# Patient Record
Sex: Female | Born: 1945 | Race: White | Hispanic: No | Marital: Married | Smoking: Never smoker
Health system: Southern US, Community
[De-identification: ages and names within clinical notes are randomized; demographics above are authoritative.]

## PROBLEM LIST (undated history)

## (undated) DIAGNOSIS — E7800 Pure hypercholesterolemia, unspecified: Secondary | ICD-10-CM

## (undated) DIAGNOSIS — K219 Gastro-esophageal reflux disease without esophagitis: Secondary | ICD-10-CM

## (undated) DIAGNOSIS — M545 Low back pain, unspecified: Secondary | ICD-10-CM

## (undated) DIAGNOSIS — Z9109 Other allergy status, other than to drugs and biological substances: Secondary | ICD-10-CM

## (undated) DIAGNOSIS — N814 Uterovaginal prolapse, unspecified: Secondary | ICD-10-CM

## (undated) DIAGNOSIS — E119 Type 2 diabetes mellitus without complications: Secondary | ICD-10-CM

## (undated) DIAGNOSIS — R600 Localized edema: Secondary | ICD-10-CM

## (undated) DIAGNOSIS — A498 Other bacterial infections of unspecified site: Secondary | ICD-10-CM

## (undated) DIAGNOSIS — D219 Benign neoplasm of connective and other soft tissue, unspecified: Secondary | ICD-10-CM

## (undated) DIAGNOSIS — J329 Chronic sinusitis, unspecified: Secondary | ICD-10-CM

## (undated) HISTORY — DX: Uterovaginal prolapse, unspecified: N81.4

## (undated) HISTORY — DX: Benign neoplasm of connective and other soft tissue, unspecified: D21.9

## (undated) HISTORY — DX: Localized edema: R60.0

## (undated) HISTORY — DX: Low back pain: M54.5

## (undated) HISTORY — DX: Pure hypercholesterolemia, unspecified: E78.00

## (undated) HISTORY — PX: TONSILLECTOMY AND ADENOIDECTOMY: SUR1326

## (undated) HISTORY — DX: Type 2 diabetes mellitus without complications: E11.9

## (undated) HISTORY — DX: Other allergy status, other than to drugs and biological substances: Z91.09

## (undated) HISTORY — DX: Other bacterial infections of unspecified site: A49.8

## (undated) HISTORY — DX: Low back pain, unspecified: M54.50

## (undated) HISTORY — DX: Chronic sinusitis, unspecified: J32.9

---

## 1997-09-06 ENCOUNTER — Other Ambulatory Visit: Admission: RE | Admit: 1997-09-06 | Discharge: 1997-09-06 | Payer: Self-pay | Admitting: *Deleted

## 1999-11-12 ENCOUNTER — Encounter: Admission: RE | Admit: 1999-11-12 | Discharge: 1999-11-12 | Payer: Self-pay | Admitting: Family Medicine

## 2000-11-24 ENCOUNTER — Encounter: Admission: RE | Admit: 2000-11-24 | Discharge: 2000-11-24 | Payer: Self-pay | Admitting: Family Medicine

## 2001-08-05 ENCOUNTER — Encounter: Admission: RE | Admit: 2001-08-05 | Discharge: 2001-08-05 | Payer: Self-pay | Admitting: Family Medicine

## 2001-08-05 ENCOUNTER — Encounter: Payer: Self-pay | Admitting: Family Medicine

## 2001-10-19 ENCOUNTER — Other Ambulatory Visit: Admission: RE | Admit: 2001-10-19 | Discharge: 2001-10-19 | Payer: Self-pay | Admitting: Obstetrics and Gynecology

## 2002-01-05 ENCOUNTER — Ambulatory Visit (HOSPITAL_COMMUNITY): Admission: RE | Admit: 2002-01-05 | Discharge: 2002-01-05 | Payer: Self-pay | Admitting: Gastroenterology

## 2003-08-10 ENCOUNTER — Other Ambulatory Visit: Admission: RE | Admit: 2003-08-10 | Discharge: 2003-08-10 | Payer: Self-pay | Admitting: Obstetrics and Gynecology

## 2003-09-03 ENCOUNTER — Encounter: Admission: RE | Admit: 2003-09-03 | Discharge: 2003-09-03 | Payer: Self-pay | Admitting: Obstetrics and Gynecology

## 2004-02-20 ENCOUNTER — Encounter: Admission: RE | Admit: 2004-02-20 | Discharge: 2004-02-20 | Payer: Self-pay | Admitting: Family Medicine

## 2005-07-10 ENCOUNTER — Other Ambulatory Visit: Admission: RE | Admit: 2005-07-10 | Discharge: 2005-07-10 | Payer: Self-pay | Admitting: Obstetrics and Gynecology

## 2005-08-14 ENCOUNTER — Encounter: Admission: RE | Admit: 2005-08-14 | Discharge: 2005-08-14 | Payer: Self-pay | Admitting: Obstetrics and Gynecology

## 2007-08-23 ENCOUNTER — Encounter: Admission: RE | Admit: 2007-08-23 | Discharge: 2007-08-23 | Payer: Self-pay | Admitting: Family Medicine

## 2008-03-23 DIAGNOSIS — N814 Uterovaginal prolapse, unspecified: Secondary | ICD-10-CM

## 2008-03-23 HISTORY — DX: Uterovaginal prolapse, unspecified: N81.4

## 2008-09-26 ENCOUNTER — Encounter: Admission: RE | Admit: 2008-09-26 | Discharge: 2008-09-26 | Payer: Self-pay | Admitting: Obstetrics and Gynecology

## 2009-10-15 ENCOUNTER — Encounter: Admission: RE | Admit: 2009-10-15 | Discharge: 2009-10-15 | Payer: Self-pay | Admitting: Obstetrics and Gynecology

## 2010-08-08 NOTE — Op Note (Signed)
   Heather Gallegos, Heather Gallegos                           ACCOUNT NO.:  [id number]   MEDICAL RECORD NO.:  [id number]                   PATIENT TYPE:  AMB   LOCATION:  ENDO                                 FACILITY:  [facility]   PHYSICIAN:  Anselmo Rod, M.D.               DATE OF BIRTH:  February 13, 1946   DATE OF PROCEDURE:  01/05/2002  DATE OF DISCHARGE:                                 OPERATIVE REPORT   PROCEDURE:  Colonoscopy.   ENDOSCOPIST:  Anselmo Rod, M.D.   INSTRUMENT USED:  Pediatric adjustable Olympus colonoscope.   INDICATION FOR PROCEDURE:  A 65 year old white female with a history of  blood and mucus in the stool.  Rule out colonic polyps, masses, etc.   PREPROCEDURE PREPARATION:  Informed consent was procured from the patient.  The patient was fasted for eight hours prior to the procedure and prepped  with a bottle of magnesium citrate and a gallon of NuLytely the night prior  to the procedure.   PREPROCEDURE PHYSICAL:  VITAL SIGNS:  The patient had stable vital signs.  NECK:  Supple.  CHEST:  Clear to auscultation.  S1, S2 regular.  ABDOMEN:  Soft with normal bowel sounds.   DESCRIPTION OF PROCEDURE:  The patient was placed in the left lateral  decubitus position and sedated with 50 mg of Demerol and 5 mg of Versed  intravenously.  Once the patient was adequately sedate and maintained on low-  flow oxygen and continuous cardiac monitoring, the Olympus video colonoscope  was advanced from the rectum to the cecum and terminal ileum without  difficulty.  The terminal ileum was only briefly visualized.  The rest of  the colonic mucosa appeared healthy.  No masses, polyps, erosions,  ulcerations, or diverticula were seen.  Small prolapsing internal  hemorrhoids were noticed on anal inspection.  Retroflexion revealed no other  abnormalities except for internal hemorrhoids as well.   IMPRESSION:  Normal colonoscopy up to the terminal ileum except for small  prolapsing internal  hemorrhoids.   RECOMMENDATIONS:  1. A high-fiber diet has been suggested for the patient along with liberal     fluid intake.  2. Outpatient follow-up is advised in the next two weeks or earlier if need     be.                                                Anselmo Rod, M.D.   JNM/MEDQ  D:  01/05/2002  T:  01/05/2002  Job:  [id number]   cc:   Tammy R. Collins [place], M.D.

## 2010-09-08 ENCOUNTER — Other Ambulatory Visit: Payer: Self-pay | Admitting: Obstetrics and Gynecology

## 2010-09-08 DIAGNOSIS — Z1231 Encounter for screening mammogram for malignant neoplasm of breast: Secondary | ICD-10-CM

## 2010-10-17 ENCOUNTER — Ambulatory Visit
Admission: RE | Admit: 2010-10-17 | Discharge: 2010-10-17 | Disposition: A | Discharge status: Home / Self Care | Payer: Medicare Other | Source: Ambulatory Visit | Attending: Obstetrics and Gynecology | Admitting: Obstetrics and Gynecology

## 2010-10-17 DIAGNOSIS — Z1231 Encounter for screening mammogram for malignant neoplasm of breast: Secondary | ICD-10-CM

## 2011-10-06 ENCOUNTER — Other Ambulatory Visit: Payer: Self-pay | Admitting: Obstetrics and Gynecology

## 2011-10-06 DIAGNOSIS — Z1231 Encounter for screening mammogram for malignant neoplasm of breast: Secondary | ICD-10-CM

## 2011-10-07 ENCOUNTER — Other Ambulatory Visit: Payer: Self-pay | Admitting: Obstetrics and Gynecology

## 2011-10-07 DIAGNOSIS — Z780 Asymptomatic menopausal state: Secondary | ICD-10-CM

## 2011-10-22 ENCOUNTER — Ambulatory Visit
Admission: RE | Admit: 2011-10-22 | Discharge: 2011-10-22 | Disposition: A | Discharge status: Home / Self Care | Payer: Medicare Other | Source: Ambulatory Visit | Attending: Obstetrics and Gynecology | Admitting: Obstetrics and Gynecology

## 2011-10-22 DIAGNOSIS — Z1231 Encounter for screening mammogram for malignant neoplasm of breast: Secondary | ICD-10-CM

## 2011-10-27 ENCOUNTER — Inpatient Hospital Stay: Admission: RE | Admit: 2011-10-27 | Payer: Medicare Other | Source: Ambulatory Visit

## 2011-11-02 ENCOUNTER — Other Ambulatory Visit: Payer: Medicare Other

## 2011-11-04 ENCOUNTER — Ambulatory Visit
Admission: RE | Admit: 2011-11-04 | Discharge: 2011-11-04 | Disposition: A | Discharge status: Home / Self Care | Payer: Medicare Other | Source: Ambulatory Visit | Attending: Obstetrics and Gynecology | Admitting: Obstetrics and Gynecology

## 2011-11-04 DIAGNOSIS — Z780 Asymptomatic menopausal state: Secondary | ICD-10-CM

## 2012-02-24 ENCOUNTER — Other Ambulatory Visit: Payer: Self-pay | Admitting: Otolaryngology

## 2012-02-24 DIAGNOSIS — J019 Acute sinusitis, unspecified: Secondary | ICD-10-CM

## 2012-02-24 DIAGNOSIS — R51 Headache: Secondary | ICD-10-CM

## 2012-02-24 DIAGNOSIS — J342 Deviated nasal septum: Secondary | ICD-10-CM

## 2012-03-01 ENCOUNTER — Ambulatory Visit
Admission: RE | Admit: 2012-03-01 | Discharge: 2012-03-01 | Disposition: A | Discharge status: Home / Self Care | Payer: Medicare Other | Source: Ambulatory Visit | Attending: Otolaryngology | Admitting: Otolaryngology

## 2012-03-01 DIAGNOSIS — J019 Acute sinusitis, unspecified: Secondary | ICD-10-CM

## 2012-03-01 DIAGNOSIS — R51 Headache: Secondary | ICD-10-CM

## 2012-03-01 DIAGNOSIS — J342 Deviated nasal septum: Secondary | ICD-10-CM

## 2012-09-29 ENCOUNTER — Encounter: Payer: Self-pay | Admitting: Obstetrics and Gynecology

## 2012-09-30 ENCOUNTER — Encounter: Payer: Self-pay | Admitting: Obstetrics and Gynecology

## 2012-09-30 ENCOUNTER — Ambulatory Visit: Payer: Self-pay | Admitting: Obstetrics and Gynecology

## 2012-09-30 DIAGNOSIS — Z01419 Encounter for gynecological examination (general) (routine) without abnormal findings: Secondary | ICD-10-CM

## 2012-10-04 ENCOUNTER — Other Ambulatory Visit: Payer: Self-pay

## 2012-10-04 DIAGNOSIS — Z1231 Encounter for screening mammogram for malignant neoplasm of breast: Secondary | ICD-10-CM

## 2012-10-28 ENCOUNTER — Ambulatory Visit
Admission: RE | Admit: 2012-10-28 | Discharge: 2012-10-28 | Disposition: A | Discharge status: Home / Self Care | Payer: Medicare Other | Source: Ambulatory Visit

## 2012-10-28 DIAGNOSIS — Z1231 Encounter for screening mammogram for malignant neoplasm of breast: Secondary | ICD-10-CM

## 2013-03-23 HISTORY — PX: BREAST CYST ASPIRATION: SHX578

## 2013-06-28 ENCOUNTER — Ambulatory Visit (INDEPENDENT_AMBULATORY_CARE_PROVIDER_SITE_OTHER): Payer: Medicare Other | Admitting: Gynecology

## 2013-06-28 ENCOUNTER — Encounter: Payer: Self-pay | Admitting: Gynecology

## 2013-06-28 VITALS — BP 122/88 | HR 88 | Resp 18 | Ht 59.25 in | Wt 154.0 lb

## 2013-06-28 DIAGNOSIS — Z01419 Encounter for gynecological examination (general) (routine) without abnormal findings: Secondary | ICD-10-CM

## 2013-06-28 DIAGNOSIS — E785 Hyperlipidemia, unspecified: Secondary | ICD-10-CM | POA: Insufficient documentation

## 2013-06-28 DIAGNOSIS — Z124 Encounter for screening for malignant neoplasm of cervix: Secondary | ICD-10-CM

## 2013-06-28 DIAGNOSIS — Z4689 Encounter for fitting and adjustment of other specified devices: Secondary | ICD-10-CM

## 2013-06-28 DIAGNOSIS — E7800 Pure hypercholesterolemia, unspecified: Secondary | ICD-10-CM

## 2013-06-28 DIAGNOSIS — N814 Uterovaginal prolapse, unspecified: Secondary | ICD-10-CM

## 2013-06-28 DIAGNOSIS — Z0000 Encounter for general adult medical examination without abnormal findings: Secondary | ICD-10-CM

## 2013-06-28 LAB — POCT URINALYSIS DIPSTICK
BILIRUBIN UA: NEGATIVE
GLUCOSE UA: NEGATIVE
KETONES UA: NEGATIVE
Leukocytes, UA: NEGATIVE
NITRITE UA: NEGATIVE
PH UA: 5
PROTEIN UA: NEGATIVE
RBC UA: NEGATIVE
UROBILINOGEN UA: NEGATIVE

## 2013-06-28 NOTE — Progress Notes (Signed)
68 y.o. Caucasian female   G2P2 here for annual exam. She does not report hot flashes, does not have night sweats, does have vaginal dryness.  She is using lubricants.  She does not report post-menopasual bleeding. Pt used pessary for short time for prolaspe but found it uncomfortable.  Pt has some issues emptying bladder, will need to reorient  No LMP recorded. Patient is postmenopausal.          Sexually active: yes  The current method of family planning is none.    Exercising: no  The patient does not participate in regular exercise at present. Last pap: 12/2011 - with PCP  Abnormal PAP: yes, about 10 years ago Mammogram: 10/2012 - BI-RADS 1: Neg BSE: no Colonoscopy: 11/2012 - Every 5 years DEXA: 10/2011 Alcohol: No Tobacco: No  Health Maintenance  Topic Date Due  . Tetanus/tdap  07/27/1964  . Colonoscopy  07/28/1995  . Zostavax  07/27/2005  . Pneumococcal Polysaccharide Vaccine Age 41 And Over  07/28/2010  . Influenza Vaccine  10/21/2013  . Mammogram  10/29/2014    Family History  Problem Relation Age of Onset  . Cancer Mother 8    Stomach cancer  . Hypertension Brother   . Diabetes Brother     Type II  . Osteoporosis Maternal Aunt     There are no active problems to display for this patient.   Past Medical History  Diagnosis Date  . Fibroid   . Uterine prolapse 2010  . Environmental allergies   . Elevated cholesterol     Past Surgical History  Procedure Laterality Date  . Tonsillectomy and adenoidectomy      Allergies: Review of patient's allergies indicates no known allergies.  Current Outpatient Prescriptions  Medication Sig Dispense Refill  . b complex vitamins tablet Take 1 tablet by mouth daily.      . Biotin 1000 MCG tablet Take 1,000 mcg by mouth daily.      . Calcium Carbonate (CALCIUM 600 PO) Take by mouth daily.      . Cetirizine HCl (ZYRTEC PO) Take by mouth as needed.      . fish oil-omega-3 fatty acids 1000 MG capsule Take 2 g by mouth  daily.      . Glucosamine 500 MG CAPS Take by mouth.      . LUTEIN PO Take 20 mg by mouth daily.      . simvastatin (ZOCOR) 40 MG tablet Take 40 mg by mouth every evening.      . vitamin C (ASCORBIC ACID) 500 MG tablet Take 500 mg by mouth daily.      . vitamin E 400 UNIT capsule Take 400 Units by mouth daily.       No current facility-administered medications for this visit.    ROS: Pertinent items are noted in HPI.  Exam:    BP 122/88  Pulse 88  Resp 18  Ht 4' 11.25" (1.505 m)  Wt 154 lb (69.854 kg)  BMI 30.84 kg/m2 Weight change: @WEIGHTCHANGE @ Last 3 height recordings:  Ht Readings from Last 3 Encounters:  06/28/13 4' 11.25" (1.505 m)   General appearance: alert, cooperative and appears stated age Head: Normocephalic, without obvious abnormality, atraumatic Neck: no adenopathy, no carotid bruit, no JVD, supple, symmetrical, trachea midline and thyroid not enlarged, symmetric, no tenderness/mass/nodules Lungs: clear to auscultation bilaterally Breasts: normal appearance, no masses or tenderness Heart: regular rate and rhythm, S1, S2 normal, no murmur, click, rub or gallop Abdomen: soft, non-tender; bowel sounds  normal; no masses,  no organomegaly Extremities: extremities normal, atraumatic, no cyanosis or edema Skin: Skin color, texture, turgor normal. No rashes or lesions Lymph nodes: Cervical, supraclavicular, and axillary nodes normal. no inguinal nodes palpated Neurologic: Grossly normal   Pelvic: External genitalia:  atrophic appearance              Urethra: normal appearing urethra with no masses, tenderness or lesions              Bartholins and Skenes: Bartholin's, Urethra, Skene's normal                 Vagina: atrophic              Cervix: erosion noted, prolapsing through labia              Pap taken: no        Bimanual Exam:  Uterus:  Axial, atrophic prolapsing                                      Adnexa:    no masses                                       Rectovaginal: Confirms                                      Anus:  normal sphincter tone, no lesions  A: well woman Uterine prolapse without cystocele/rectocele     P: mammogram counseled on breast self exam, mammography screening, adequate intake of calcium and vitamin D, diet and exercise Recommend avoiding valsalva with void, discussed alternative ways to micturate Pt informed re cervical erosion due to prolapse without valsalva, had tried ring in past, discussed alternative pessary shapes, hodge with support placed #4, comfortable but pt would do better with #3 return annually or prn Discussed PAP guideline changes, importance of weight bearing exercises, calcium, vit D and balanced diet.  An After Visit Summary was printed and given to the patient.

## 2013-08-04 ENCOUNTER — Ambulatory Visit (INDEPENDENT_AMBULATORY_CARE_PROVIDER_SITE_OTHER): Payer: Medicare Other | Admitting: Gynecology

## 2013-08-04 VITALS — BP 140/78 | HR 64 | Resp 16 | Ht 59.25 in | Wt 153.4 lb

## 2013-08-04 DIAGNOSIS — Z4689 Encounter for fitting and adjustment of other specified devices: Secondary | ICD-10-CM

## 2013-08-04 DIAGNOSIS — N814 Uterovaginal prolapse, unspecified: Secondary | ICD-10-CM

## 2013-08-04 NOTE — Progress Notes (Signed)
Subjective:     Patient ID: Heather Gallegos, female   DOB: [birth year], 68 y.o.   MRN: [id number]  HPI Comments: Pt is here for pessary placement for uterine prolapse.  Pt had had a hodge with support placed at annual and felt it was more comfortable that the ring but felt that the smaller size would be better.  She denies any complaints.  She has had better success with complete voiding after changing position.      Review of Systems  Constitutional: Negative for fever and chills.  Genitourinary: Negative for dysuria, urgency, frequency, flank pain, decreased urine volume, vaginal bleeding, vaginal discharge, vaginal pain and pelvic pain.       Objective:   Physical Exam  Nursing note and vitals reviewed. Constitutional: She is oriented to person, place, and time. She appears well-developed and well-nourished.  Neurological: She is alert and oriented to person, place, and time.  pelvic: Cervix noted at hymenal ring, no excorations, labia not split.   Uterus easily replaced, mobile nontender Adnexa: negative     Assessment:     Uterine prolapse     Plan:     Pessary placement #3 hodge with support placed with deflection of uterus, pt comfortable.  Re-examined upright with  Valsalva, pessary shifted and anterior portion noted not to return to pre-placement shape. #4 placed, pt comfortable, re-examined upright and appears supportive and in place. Pt instructed to return after a few hours to assess comfort and activities of daily living.  Pt returned, pessary had shifted with voiding and was partially dislodged. Pt elects to not try another pessary.  She is currently not bothered with prolapse and review of records indicates that it has been stable over a number of years.  Pt instructed to call for signs of UTI, flank pain or difficulty voiding.  In addition if she should notice further descent before next appointment.  She had not been interested in surgery, but would consider if there  is a change or if she is no longer working  Total 67m spent between 2 visits

## 2013-10-04 ENCOUNTER — Other Ambulatory Visit: Payer: Self-pay

## 2013-10-04 DIAGNOSIS — Z1231 Encounter for screening mammogram for malignant neoplasm of breast: Secondary | ICD-10-CM

## 2013-10-05 LAB — LIPID PANEL
Cholesterol: 153 mg/dL (ref 0–200)
HDL: 48 mg/dL (ref 35–70)
LDL Cholesterol: 70 mg/dL
Triglycerides: 174 mg/dL — AB (ref 40–160)

## 2013-10-30 ENCOUNTER — Ambulatory Visit
Admission: RE | Admit: 2013-10-30 | Discharge: 2013-10-30 | Disposition: A | Discharge status: Home / Self Care | Payer: Medicare Other | Source: Ambulatory Visit

## 2013-10-30 DIAGNOSIS — Z1231 Encounter for screening mammogram for malignant neoplasm of breast: Secondary | ICD-10-CM

## 2013-10-31 ENCOUNTER — Other Ambulatory Visit: Payer: Self-pay | Admitting: Family Medicine

## 2013-10-31 DIAGNOSIS — R928 Other abnormal and inconclusive findings on diagnostic imaging of breast: Secondary | ICD-10-CM

## 2013-11-01 ENCOUNTER — Other Ambulatory Visit: Payer: Self-pay | Admitting: Family Medicine

## 2013-11-01 DIAGNOSIS — R928 Other abnormal and inconclusive findings on diagnostic imaging of breast: Secondary | ICD-10-CM

## 2013-11-07 ENCOUNTER — Other Ambulatory Visit: Payer: Self-pay | Admitting: Family Medicine

## 2013-11-07 ENCOUNTER — Ambulatory Visit
Admission: RE | Admit: 2013-11-07 | Discharge: 2013-11-07 | Disposition: A | Discharge status: Home / Self Care | Payer: Medicare Other | Source: Ambulatory Visit | Attending: Family Medicine | Admitting: Family Medicine

## 2013-11-07 DIAGNOSIS — R928 Other abnormal and inconclusive findings on diagnostic imaging of breast: Secondary | ICD-10-CM

## 2013-11-10 ENCOUNTER — Other Ambulatory Visit: Payer: Self-pay | Admitting: Family Medicine

## 2013-11-10 DIAGNOSIS — R928 Other abnormal and inconclusive findings on diagnostic imaging of breast: Secondary | ICD-10-CM

## 2013-11-14 ENCOUNTER — Ambulatory Visit
Admission: RE | Admit: 2013-11-14 | Discharge: 2013-11-14 | Disposition: A | Discharge status: Home / Self Care | Payer: Medicare Other | Source: Ambulatory Visit | Attending: Family Medicine | Admitting: Family Medicine

## 2013-11-14 ENCOUNTER — Other Ambulatory Visit: Payer: Self-pay | Admitting: Family Medicine

## 2013-11-14 DIAGNOSIS — R928 Other abnormal and inconclusive findings on diagnostic imaging of breast: Secondary | ICD-10-CM

## 2014-01-22 ENCOUNTER — Encounter: Payer: Self-pay | Admitting: Gynecology

## 2014-02-20 ENCOUNTER — Telehealth: Payer: Self-pay | Admitting: Gynecology

## 2014-02-20 NOTE — Telephone Encounter (Signed)
lmtcb to rs aex from 07/04/14

## 2014-05-04 DIAGNOSIS — J320 Chronic maxillary sinusitis: Secondary | ICD-10-CM | POA: Diagnosis not present

## 2014-06-04 DIAGNOSIS — J22 Unspecified acute lower respiratory infection: Secondary | ICD-10-CM | POA: Diagnosis not present

## 2014-06-04 DIAGNOSIS — J040 Acute laryngitis: Secondary | ICD-10-CM | POA: Diagnosis not present

## 2014-07-04 ENCOUNTER — Ambulatory Visit: Payer: Medicare Other | Admitting: Certified Nurse Midwife

## 2014-07-04 ENCOUNTER — Ambulatory Visit: Payer: Medicare Other | Admitting: Gynecology

## 2014-08-22 LAB — HM COLONOSCOPY

## 2014-10-03 DIAGNOSIS — H2513 Age-related nuclear cataract, bilateral: Secondary | ICD-10-CM | POA: Diagnosis not present

## 2014-10-22 ENCOUNTER — Other Ambulatory Visit: Payer: Self-pay

## 2014-10-22 DIAGNOSIS — Z1231 Encounter for screening mammogram for malignant neoplasm of breast: Secondary | ICD-10-CM

## 2014-10-24 ENCOUNTER — Ambulatory Visit (INDEPENDENT_AMBULATORY_CARE_PROVIDER_SITE_OTHER): Payer: Medicare Other | Admitting: Obstetrics and Gynecology

## 2014-10-24 ENCOUNTER — Encounter: Payer: Self-pay | Admitting: Obstetrics and Gynecology

## 2014-10-24 VITALS — BP 156/96 | HR 84 | Resp 16 | Ht 59.0 in | Wt 156.0 lb

## 2014-10-24 DIAGNOSIS — N3946 Mixed incontinence: Secondary | ICD-10-CM | POA: Diagnosis not present

## 2014-10-24 DIAGNOSIS — N814 Uterovaginal prolapse, unspecified: Secondary | ICD-10-CM

## 2014-10-24 DIAGNOSIS — Z0000 Encounter for general adult medical examination without abnormal findings: Secondary | ICD-10-CM | POA: Diagnosis not present

## 2014-10-24 DIAGNOSIS — Z124 Encounter for screening for malignant neoplasm of cervix: Secondary | ICD-10-CM | POA: Diagnosis not present

## 2014-10-24 DIAGNOSIS — Z01419 Encounter for gynecological examination (general) (routine) without abnormal findings: Secondary | ICD-10-CM | POA: Diagnosis not present

## 2014-10-24 DIAGNOSIS — Z1151 Encounter for screening for human papillomavirus (HPV): Secondary | ICD-10-CM | POA: Diagnosis not present

## 2014-10-24 LAB — POCT URINALYSIS DIPSTICK
Bilirubin, UA: NEGATIVE
Glucose, UA: NEGATIVE
Ketones, UA: NEGATIVE
LEUKOCYTES UA: NEGATIVE
Nitrite, UA: NEGATIVE
Protein, UA: NEGATIVE
RBC UA: NEGATIVE
Spec Grav, UA: 1.02
Urobilinogen, UA: NEGATIVE
pH, UA: 7

## 2014-10-24 NOTE — Progress Notes (Addendum)
Patient ID: Heather Gallegos, female   DOB: 1945/04/27, 69 y.o.   MRN: [id number] 69 y.o. G2P2 MarriedCaucasianF here for annual exam.  The patient has a h/o uterine prolapse, tried a few pessaries, didn't work well. Her prolapse hasn't changed, her cervix comes out. She only notices it a couple of times a week when she is sitting. Not aware of it when she is standing. She leaks urine with valsalva, wears a small pad. Leaks a small amounts. Occasionally leaks on the way to the bathroom, small amount, happens about 1 x a week. Normal bowel function. Sexually active, no pain. No vaginal bleeding.   Patient's last menstrual period was 03/23/1998.          Sexually active: Yes.    The current method of family planning is post menopausal status.    Exercising: No.  The patient does not participate in regular exercise at present. Smoker:  no  Health Maintenance: Pap:  12/2011 with PCP- WNL per patient  History of abnormal Pap:  At least 15 years ago, negative biopsy. Last pap was normal in 2013  MMG:  11-07-13- ABN -U/S- Nodule- U/S guided aspiration of left breast- cyst located @ 1 o'clock position - repeat screening MM in 1 yr. Mammogram scheduled for next month. Colonoscopy:  11/2012 - every 5 years (polyp) BMD:   10/2011, slight osteopenia, followed with her primary. TDaP:  2010 Screening Lab: PCP does labs  Urine today: WNL    reports that she has never smoked. She has never used smokeless tobacco. She reports that she does not drink alcohol or use illicit drugs.  Past Medical History  Diagnosis Date  . Fibroid   . Uterine prolapse 2010  . Environmental allergies   . Elevated cholesterol     Past Surgical History  Procedure Laterality Date  . Tonsillectomy and adenoidectomy      Current Outpatient Prescriptions  Medication Sig Dispense Refill  . b complex vitamins tablet Take 1 tablet by mouth daily.    . Biotin 1000 MCG tablet Take 1,000 mcg by mouth daily.    . Calcium Carbonate (CALCIUM  600 PO) Take by mouth daily.    . Cetirizine HCl (ZYRTEC PO) Take by mouth as needed.    . fish oil-omega-3 fatty acids 1000 MG capsule Take 2 g by mouth daily.    . Glucosamine 500 MG CAPS Take by mouth.    . LUTEIN PO Take 20 mg by mouth daily.    . simvastatin (ZOCOR) 40 MG tablet Take 40 mg by mouth every evening.    . vitamin C (ASCORBIC ACID) 500 MG tablet Take 500 mg by mouth daily.    . vitamin E 400 UNIT capsule Take 400 Units by mouth daily.     No current facility-administered medications for this visit.    Family History  Problem Relation Age of Onset  . Cancer Mother 20    Stomach cancer  . Hypertension Brother   . Diabetes Brother     Type II  . Osteoporosis Maternal Aunt     ROS:  Pertinent items are noted in HPI.  Otherwise, a comprehensive ROS was negative.  Exam:   BP 160/84 mmHg  Pulse 84  Resp 16  Ht 4\' 11"  (1.499 m)  Wt 156 lb (70.761 kg)  BMI 31.49 kg/m2  LMP 03/23/1998  Weight change: @WEIGHTCHANGE @ Height:   Height: 4\' 11"  (149.9 cm)  Ht Readings from Last 3 Encounters:  10/24/14 4\' 11"  (1.499 m)  08/04/13 4' 11.25" (1.505 m)  06/28/13 4' 11.25" (1.505 m)    General appearance: alert, cooperative and appears stated age Head: Normocephalic, without obvious abnormality, atraumatic Neck: no adenopathy, supple, symmetrical, trachea midline and thyroid normal to inspection and palpation Lungs: clear to auscultation bilaterally Breasts: normal appearance, no masses or tenderness Heart: regular rate and rhythm Abdomen: soft, non-tender; bowel sounds normal; no masses,  no organomegaly Extremities: extremities normal, atraumatic, no cyanosis or edema Skin: Skin color, texture, turgor normal. No rashes or lesions Lymph nodes: Cervical, supraclavicular, and axillary nodes normal. No abnormal inguinal nodes palpated Neurologic: Grossly normal   Pelvic: External genitalia:  no lesions              Urethra:  normal appearing urethra with no masses,  tenderness or lesions              Bartholins and Skenes: normal                 Vagina: normal appearing vagina with normal color and discharge, no lesions              Cervix: no lesions              Pap taken: Yes.   Bimanual Exam:  Uterus:  Normal sized, mobile, retroverted uterus, elongated cervix with grade 2-3 uterine prolapse. The cervix projects 2-3 cm beyond the hymen with valsalva. The patient was examined supine and standing.              Adnexa: normal adnexa and no mass, fullness, tenderness               Rectovaginal: Confirms               Anus:  normal sphincter tone, no lesions  Chaperone was present for exam.  A:  Well Woman with normal exam  Mixed incontinence  Prolapse, grade 2-3, currently tolerable  Elevated BP, no h/o hypertension  P:   F/U BP with primary  Pap with HPV  Mammogram next month  Labs with primary  Continue calcium and vit D  Continue BSE  Send urine for Ua, c&s  Set up PT for incontinence  Discussed prolapse and options for treatment. Information given.  Avoid heavy lifting and straining   Cc: Dr Emmie Niemann

## 2014-10-24 NOTE — Patient Instructions (Signed)
EXERCISE AND DIET:  We recommended that you start or continue a regular exercise program for good health. Regular exercise means any activity that makes your heart beat faster and makes you sweat.  We recommend exercising at least 30 minutes per day at least 3 days a week, preferably 4 or 5.  We also recommend a diet low in fat and sugar.  Inactivity, poor dietary choices and obesity can cause diabetes, heart attack, stroke, and kidney damage, among others.    ALCOHOL AND SMOKING:  Women should limit their alcohol intake to no more than 7 drinks/beers/glasses of wine (combined, not each!) per week. Moderation of alcohol intake to this level decreases your risk of breast cancer and liver damage. And of course, no recreational drugs are part of a healthy lifestyle.  And absolutely no smoking or even second hand smoke. Most people know smoking can cause heart and lung diseases, but did you know it also contributes to weakening of your bones? Aging of your skin?  Yellowing of your teeth and nails?  CALCIUM AND VITAMIN D:  Adequate intake of calcium and Vitamin D are recommended.  The recommendations for exact amounts of these supplements seem to change often, but generally speaking 600 mg of calcium (either carbonate or citrate) and 800 units of Vitamin D per day seems prudent. Certain women may benefit from higher intake of Vitamin D.  If you are among these women, your doctor will have told you during your visit.    PAP SMEARS:  Pap smears, to check for cervical cancer or precancers,  have traditionally been done yearly, although recent scientific advances have shown that most women can have pap smears less often.  However, every woman still should have a physical exam from her gynecologist every year. It will include a breast check, inspection of the vulva and vagina to check for abnormal growths or skin changes, a visual exam of the cervix, and then an exam to evaluate the size and shape of the uterus and  ovaries.  And after 69 years of age, a rectal exam is indicated to check for rectal cancers. We will also provide age appropriate advice regarding health maintenance, like when you should have certain vaccines, screening for sexually transmitted diseases, bone density testing, colonoscopy, mammograms, etc.   MAMMOGRAMS:  All women over 40 years old should have a yearly mammogram. Many facilities now offer a "3D" mammogram, which may cost around $50 extra out of pocket. If possible,  we recommend you accept the option to have the 3D mammogram performed.  It both reduces the number of women who will be called back for extra views which then turn out to be normal, and it is better than the routine mammogram at detecting truly abnormal areas.    COLONOSCOPY:  Colonoscopy to screen for colon cancer is recommended for all women at age 50.  We know, you hate the idea of the prep.  We agree, BUT, having colon cancer and not knowing it is worse!!  Colon cancer so often starts as a polyp that can be seen and removed at colonscopy, which can quite literally save your life!  And if your first colonoscopy is normal and you have no family history of colon cancer, most women don't have to have it again for 10 years.  Once every ten years, you can do something that may end up saving your life, right?  We will be happy to help you get it scheduled when you are ready.    Be sure to check your insurance coverage so you understand how much it will cost.  It may be covered as a preventative service at no cost, but you should check your particular policy.     Kegel Exercises The goal of Kegel exercises is to isolate and exercise your pelvic floor muscles. These muscles act as a hammock that supports the rectum, vagina, small intestine, and uterus. As the muscles weaken, the hammock sags and these organs are displaced from their normal positions. Kegel exercises can strengthen your pelvic floor muscles and help you to improve  bladder and bowel control, improve sexual response, and help reduce many problems and some discomfort during pregnancy. Kegel exercises can be done anywhere and at any time. HOW TO PERFORM KEGEL EXERCISES 1. Locate your pelvic floor muscles. To do this, squeeze (contract) the muscles that you use when you try to stop the flow of urine. You will feel a tightness in the vaginal area (women) and a tight lift in the rectal area (men and women). 2. When you begin, contract your pelvic muscles tight for 2-5 seconds, then relax them for 2-5 seconds. This is one set. Do 4-5 sets with a short pause in between. 3. Contract your pelvic muscles for 8-10 seconds, then relax them for 8-10 seconds. Do 4-5 sets. If you cannot contract your pelvic muscles for 8-10 seconds, try 5-7 seconds and work your way up to 8-10 seconds. Your goal is 4-5 sets of 10 contractions each day. Keep your stomach, buttocks, and legs relaxed during the exercises. Perform sets of both short and long contractions. Vary your positions. Perform these contractions 3-4 times per day. Perform sets while you are:   Lying in bed in the morning.  Standing at lunch.  Sitting in the late afternoon.  Lying in bed at night. You should do 40-50 contractions per day. Do not perform more Kegel exercises per day than recommended. Overexercising can cause muscle fatigue. Continue these exercises for for at least 15-20 weeks or as directed by your caregiver. Document Released: 02/24/2012 Document Reviewed: 02/24/2012 [facility] Patient Information 2015 [place]. This information is not intended to replace advice given to you by your health care provider. Make sure you discuss any questions you have with your health care provider.

## 2014-10-25 LAB — URINALYSIS, MICROSCOPIC ONLY
Bacteria, UA: NONE SEEN [HPF]
CASTS: NONE SEEN [LPF]
Crystals: NONE SEEN [HPF]
RBC / HPF: NONE SEEN RBC/HPF (ref <=2)
WBC UA: NONE SEEN WBC/HPF (ref <=5)
YEAST: NONE SEEN [HPF]

## 2014-10-26 LAB — URINE CULTURE

## 2014-10-26 LAB — IPS PAP TEST WITH HPV

## 2014-11-05 ENCOUNTER — Ambulatory Visit: Payer: Medicare Other

## 2014-11-12 DIAGNOSIS — M6281 Muscle weakness (generalized): Secondary | ICD-10-CM | POA: Diagnosis not present

## 2014-11-12 DIAGNOSIS — N3946 Mixed incontinence: Secondary | ICD-10-CM | POA: Diagnosis not present

## 2014-11-12 DIAGNOSIS — N814 Uterovaginal prolapse, unspecified: Secondary | ICD-10-CM | POA: Diagnosis not present

## 2014-11-12 DIAGNOSIS — R278 Other lack of coordination: Secondary | ICD-10-CM | POA: Diagnosis not present

## 2014-11-29 DIAGNOSIS — L239 Allergic contact dermatitis, unspecified cause: Secondary | ICD-10-CM | POA: Diagnosis not present

## 2014-12-14 ENCOUNTER — Ambulatory Visit
Admission: RE | Admit: 2014-12-14 | Discharge: 2014-12-14 | Disposition: A | Discharge status: Home / Self Care | Payer: Medicare Other | Source: Ambulatory Visit

## 2014-12-14 DIAGNOSIS — Z1231 Encounter for screening mammogram for malignant neoplasm of breast: Secondary | ICD-10-CM

## 2015-02-26 DIAGNOSIS — H109 Unspecified conjunctivitis: Secondary | ICD-10-CM | POA: Diagnosis not present

## 2015-02-26 DIAGNOSIS — M7989 Other specified soft tissue disorders: Secondary | ICD-10-CM | POA: Diagnosis not present

## 2015-02-26 DIAGNOSIS — M545 Low back pain: Secondary | ICD-10-CM | POA: Diagnosis not present

## 2015-02-26 DIAGNOSIS — Z23 Encounter for immunization: Secondary | ICD-10-CM | POA: Diagnosis not present

## 2015-03-14 ENCOUNTER — Encounter: Payer: Self-pay | Admitting: Vascular Surgery

## 2015-03-14 ENCOUNTER — Other Ambulatory Visit: Payer: Self-pay | Admitting: *Deleted

## 2015-03-14 DIAGNOSIS — I83893 Varicose veins of bilateral lower extremities with other complications: Secondary | ICD-10-CM

## 2015-03-14 DIAGNOSIS — M7989 Other specified soft tissue disorders: Secondary | ICD-10-CM

## 2015-04-24 ENCOUNTER — Encounter: Payer: Self-pay | Admitting: Vascular Surgery

## 2015-04-29 ENCOUNTER — Encounter: Payer: Self-pay | Admitting: Vascular Surgery

## 2015-04-30 ENCOUNTER — Encounter: Payer: Medicare Other | Admitting: Vascular Surgery

## 2015-04-30 ENCOUNTER — Encounter (HOSPITAL_COMMUNITY): Payer: Medicare Other

## 2015-05-29 DIAGNOSIS — J343 Hypertrophy of nasal turbinates: Secondary | ICD-10-CM | POA: Diagnosis not present

## 2015-05-29 DIAGNOSIS — J329 Chronic sinusitis, unspecified: Secondary | ICD-10-CM | POA: Diagnosis not present

## 2015-05-29 DIAGNOSIS — J342 Deviated nasal septum: Secondary | ICD-10-CM | POA: Diagnosis not present

## 2015-06-05 ENCOUNTER — Encounter: Payer: Self-pay | Admitting: Vascular Surgery

## 2015-06-11 ENCOUNTER — Other Ambulatory Visit: Payer: Self-pay | Admitting: Vascular Surgery

## 2015-06-11 ENCOUNTER — Ambulatory Visit (HOSPITAL_COMMUNITY)
Admission: RE | Admit: 2015-06-11 | Discharge: 2015-06-11 | Disposition: A | Discharge status: Home / Self Care | Payer: Medicare Other | Source: Ambulatory Visit | Attending: Vascular Surgery | Admitting: Vascular Surgery

## 2015-06-11 ENCOUNTER — Ambulatory Visit (INDEPENDENT_AMBULATORY_CARE_PROVIDER_SITE_OTHER): Payer: Medicare Other | Admitting: Vascular Surgery

## 2015-06-11 ENCOUNTER — Encounter: Payer: Self-pay | Admitting: Vascular Surgery

## 2015-06-11 VITALS — BP 142/81 | HR 74 | Temp 98.4°F | Resp 18 | Ht 59.0 in | Wt 155.6 lb

## 2015-06-11 DIAGNOSIS — M7989 Other specified soft tissue disorders: Secondary | ICD-10-CM

## 2015-06-11 DIAGNOSIS — I83892 Varicose veins of left lower extremities with other complications: Secondary | ICD-10-CM | POA: Diagnosis not present

## 2015-06-11 DIAGNOSIS — I83893 Varicose veins of bilateral lower extremities with other complications: Secondary | ICD-10-CM

## 2015-06-11 NOTE — Progress Notes (Signed)
Vascular and Vein Specialist of [facility]  Patient name: Heather Gallegos MRN: [id number] DOB: [birth year] Sex: female  REASON FOR VISIT: Evaluation of left leg swelling  HPI: Heather Gallegos is a 70 y.o. female is quite healthy. She had noted swelling in her left leg. This is worse at the end of the day. She does not have any discomfort associated with this. She does have some relief with elevation. She has not worn compression garments. No history of cardiac disease.  Past Medical History  Diagnosis Date  . Fibroid   . Uterine prolapse 2010  . Environmental allergies   . Elevated cholesterol   . Leg edema, left   . Low back pain   . Chronic sinusitis     Family History  Problem Relation Age of Onset  . Cancer Mother 36    Stomach cancer  . Hypertension Brother   . Diabetes Brother     Type II  . Osteoporosis Maternal Aunt     SOCIAL HISTORY: Social History  Substance Use Topics  . Smoking status: Never Smoker   . Smokeless tobacco: Never Used  . Alcohol Use: No    No Known Allergies  Current Outpatient Prescriptions  Medication Sig Dispense Refill  . b complex vitamins tablet Take 1 tablet by mouth daily.    . Biotin 1000 MCG tablet Take 1,000 mcg by mouth daily.    . Calcium Carbonate (CALCIUM 600 PO) Take by mouth daily.    . Cetirizine HCl (ZYRTEC PO) Take by mouth as needed.    . fish oil-omega-3 fatty acids 1000 MG capsule Take 2 g by mouth daily.    . fluticasone (FLOVENT DISKUS) 50 MCG/BLIST diskus inhaler Inhale 1 puff into the lungs 2 (two) times daily.    . Glucosamine 500 MG CAPS Take by mouth.    . LUTEIN PO Take 20 mg by mouth daily.    . simvastatin (ZOCOR) 40 MG tablet Take 40 mg by mouth every evening.    . vitamin C (ASCORBIC ACID) 500 MG tablet Take 500 mg by mouth daily.    . vitamin E 400 UNIT capsule Take 400 Units by mouth daily.    . hydrocortisone 1 % lotion Apply 1 application topically 2 (two) times daily. Reported on 06/11/2015     No  current facility-administered medications for this visit.    REVIEW OF SYSTEMS:  [X]  denotes positive finding, [ ]  denotes negative finding Cardiac  Comments:  Chest pain or chest pressure:    Shortness of breath upon exertion:    Short of breath when lying flat:    Irregular heart rhythm:        Vascular    Pain in calf, thigh, or hip brought on by ambulation:    Pain in feet at night that wakes you up from your sleep:     Blood clot in your veins:    Leg swelling:  x       Pulmonary    Oxygen at home:    Productive cough:     Wheezing:         Neurologic    Sudden weakness in arms or legs:     Sudden numbness in arms or legs:     Sudden onset of difficulty speaking or slurred speech:    Temporary loss of vision in one eye:     Problems with dizziness:         Gastrointestinal    Blood in stool:  Vomited blood:         Genitourinary    Burning when urinating:     Blood in urine:        Psychiatric    Major depression:         Hematologic    Bleeding problems:    Problems with blood clotting too easily:        Skin    Rashes or ulcers:        Constitutional    Fever or chills:      PHYSICAL EXAM: Filed Vitals:   06/11/15 1021 06/11/15 1024  BP: 147/88 142/81  Pulse: 74   Temp: 98.4 F (36.9 C)   TempSrc: Oral   Resp: 18   Height: 4\' 11"  (1.499 m)   Weight: 155 lb 9.6 oz (70.58 kg)   SpO2: 96%     GENERAL: The patient is a well-nourished female, in no acute distress. The vital signs are documented above. CARDIAC: There is a regular rate and rhythm.  VASCULAR: Plus radial pulses bilaterally. 1+ dorsalis pedis and 2+ posterior tibial pulses bilaterally. No venous varicosities PULMONARY: There is good air exchange bilaterally without wheezing or rales. ABDOMEN: Soft and non-tender  MUSCULOSKELETAL: There are no major deformities or cyanosis. NEUROLOGIC: No focal weakness or paresthesias are detected. SKIN: There are no ulcers or rashes noted.  Changes of venous hypertension PSYCHIATRIC: The patient has a normal affect. Very mild pitting edema bilaterally in the pretibial area. No swelling greater on the left than on the right today  DATA:  Noninvasive duplex of her left leg showed no evidence of DVT. She does have mild reflux in her common femoral vein on the left but no other venous reflux in the deep or superficial system  MEDICAL ISSUES: I discussed this with patient. Reassured her there is no evidence of DVT or other dangerous issues regarding her swelling. Also reported that she has no physical findings which would suggest any evidence of lymphedema. Pain that she has essentially normal arterial and venous flow. Did explain the elevation would help and that if she had persistent difficulty compression would help as well. She is reassured this discussion will see [place] again on as-needed basis No Follow-up on file.   Curt Jews [facility] Specialists of [place]: [phone]

## 2015-06-11 NOTE — Progress Notes (Signed)
Filed Vitals:   06/11/15 1021 06/11/15 1024  BP: 147/88 142/81  Pulse: 74   Temp: 98.4 F (36.9 C)   TempSrc: Oral   Resp: 18   Height: 4\' 11"  (1.499 m)   Weight: 155 lb 9.6 oz (70.58 kg)   SpO2: 96%

## 2015-06-28 DIAGNOSIS — J0141 Acute recurrent pansinusitis: Secondary | ICD-10-CM | POA: Diagnosis not present

## 2015-06-28 DIAGNOSIS — J310 Chronic rhinitis: Secondary | ICD-10-CM | POA: Diagnosis not present

## 2015-06-28 DIAGNOSIS — J342 Deviated nasal septum: Secondary | ICD-10-CM | POA: Diagnosis not present

## 2015-06-28 DIAGNOSIS — J343 Hypertrophy of nasal turbinates: Secondary | ICD-10-CM | POA: Diagnosis not present

## 2015-07-24 DIAGNOSIS — L03116 Cellulitis of left lower limb: Secondary | ICD-10-CM | POA: Diagnosis not present

## 2015-07-24 DIAGNOSIS — R112 Nausea with vomiting, unspecified: Secondary | ICD-10-CM | POA: Diagnosis not present

## 2015-07-26 DIAGNOSIS — L03116 Cellulitis of left lower limb: Secondary | ICD-10-CM | POA: Diagnosis not present

## 2015-07-29 DIAGNOSIS — L02419 Cutaneous abscess of limb, unspecified: Secondary | ICD-10-CM | POA: Diagnosis not present

## 2015-07-29 DIAGNOSIS — L03119 Cellulitis of unspecified part of limb: Secondary | ICD-10-CM | POA: Diagnosis not present

## 2015-08-05 DIAGNOSIS — L03116 Cellulitis of left lower limb: Secondary | ICD-10-CM | POA: Diagnosis not present

## 2015-08-28 ENCOUNTER — Encounter: Payer: Self-pay | Admitting: Family Medicine

## 2015-08-28 ENCOUNTER — Ambulatory Visit (INDEPENDENT_AMBULATORY_CARE_PROVIDER_SITE_OTHER): Payer: Medicare Other | Admitting: Family Medicine

## 2015-08-28 ENCOUNTER — Other Ambulatory Visit (HOSPITAL_BASED_OUTPATIENT_CLINIC_OR_DEPARTMENT_OTHER): Payer: Medicare Other

## 2015-08-28 VITALS — BP 136/80 | HR 80 | Temp 98.1°F | Resp 17 | Ht 59.0 in | Wt 155.2 lb

## 2015-08-28 DIAGNOSIS — R2240 Localized swelling, mass and lump, unspecified lower limb: Secondary | ICD-10-CM

## 2015-08-28 LAB — CBC WITH DIFFERENTIAL/PLATELET
BASOS PCT: 1 %
Basophils Absolute: 62 cells/uL (ref 0–200)
EOS ABS: 310 {cells}/uL (ref 15–500)
EOS PCT: 5 %
HCT: 38.3 % (ref 35.0–45.0)
Hemoglobin: 12.8 g/dL (ref 11.7–15.5)
LYMPHS PCT: 34 %
Lymphs Abs: 2108 cells/uL (ref 850–3900)
MCH: 29.1 pg (ref 27.0–33.0)
MCHC: 33.4 g/dL (ref 32.0–36.0)
MCV: 87 fL (ref 80.0–100.0)
MONOS PCT: 9 %
MPV: 9.3 fL (ref 7.5–12.5)
Monocytes Absolute: 558 cells/uL (ref 200–950)
Neutro Abs: 3162 cells/uL (ref 1500–7800)
Neutrophils Relative %: 51 %
PLATELETS: 327 10*3/uL (ref 140–400)
RBC: 4.4 MIL/uL (ref 3.80–5.10)
RDW: 14.4 % (ref 11.0–15.0)
WBC: 6.2 10*3/uL (ref 3.8–10.8)

## 2015-08-28 NOTE — Progress Notes (Signed)
Pre visit review using our clinic review tool, if applicable. No additional management support is needed unless otherwise documented below in the visit note. 

## 2015-08-28 NOTE — Patient Instructions (Signed)
We'll notify you as soon as we know your ultrasound results We'll notify you of your lab results and make any changes if needed Ice, elevate! Call with any questions or concerns Hang in there!!! Welcome!!  We're glad to have you!!!

## 2015-08-28 NOTE — Assessment & Plan Note (Signed)
New.  I am concerned that this is not cellulitis but rather a DVT as pt was treated w/ both Keflex and Doxy which should have adequately treated cellulitis weeks ago.  Get stat venous doppler.  Check labs to r/o infxn, autoimmune process, or other cause.  Continue to elevate, ice/heat.  Will determine next steps based on results of labs and imaging.  Reviewed supportive care and red flags that should prompt return.  Pt expressed understanding and is in agreement w/ plan.

## 2015-08-28 NOTE — Progress Notes (Signed)
   Subjective:    Patient ID: Heather Gallegos, female    DOB: 1945/10/14, 70 y.o.   MRN: [id number]  HPI New to establish.  Previous MD- [facility] [place], [place])  L lower leg redness- sxs started 5/1 w/ 'violent chills' and vomiting.  The next night noticed a red spot on lower leg w/ white center.  By AM, redness had spread and she thought she was having a reaction to the insect bite.  dx'd w/ cellulitis and tx'd w/ Keflex which was switched to Doxy.  'i was feeling really really bad'.  Out of work x2 weeks.  Leg is intermittently painful, no difficulty w/ walking.  + swelling compared w/ other leg.  No recent fevers.  No recent hormone replacement or travel.  On 3/21, pt had normal venous doppler of L LE but this was prior to onset of redness.  Review of Systems For ROS see HPI     Objective:   Physical Exam  Constitutional: She is oriented to person, place, and time. She appears well-developed and well-nourished. No distress.  HENT:  Head: Normocephalic and atraumatic.  Eyes: Conjunctivae and EOM are normal. Pupils are equal, round, and reactive to light.  Cardiovascular: Normal rate, regular rhythm, normal heart sounds and intact distal pulses.   Pulmonary/Chest: Effort normal and breath sounds normal. No respiratory distress. She has no wheezes. She has no rales.  Musculoskeletal: She exhibits edema (1-2+ pitting edema of L foot and lower leg to level of knee w/ circumferential redness of lower 1/3 of lower leg) and tenderness.  Neurological: She is alert and oriented to person, place, and time.  Skin: Skin is warm and dry. No rash noted. There is erythema.  Vitals reviewed.         Assessment & Plan:

## 2015-08-29 ENCOUNTER — Other Ambulatory Visit: Payer: Self-pay | Admitting: General Practice

## 2015-08-29 ENCOUNTER — Ambulatory Visit (HOSPITAL_BASED_OUTPATIENT_CLINIC_OR_DEPARTMENT_OTHER)
Admission: RE | Admit: 2015-08-29 | Discharge: 2015-08-29 | Disposition: A | Discharge status: Home / Self Care | Payer: Medicare Other | Source: Ambulatory Visit | Attending: Family Medicine | Admitting: Family Medicine

## 2015-08-29 DIAGNOSIS — R600 Localized edema: Secondary | ICD-10-CM | POA: Diagnosis not present

## 2015-08-29 DIAGNOSIS — R2240 Localized swelling, mass and lump, unspecified lower limb: Secondary | ICD-10-CM | POA: Insufficient documentation

## 2015-08-29 LAB — ANA: Anti Nuclear Antibody(ANA): NEGATIVE

## 2015-08-29 LAB — SEDIMENTATION RATE: Sed Rate: 8 mm/hr (ref 0–30)

## 2015-08-29 LAB — URIC ACID: URIC ACID, SERUM: 4.5 mg/dL (ref 2.5–7.0)

## 2015-08-29 MED ORDER — FUROSEMIDE 20 MG PO TABS: 20.0000 mg | ORAL_TABLET | Freq: Every day | ORAL | Status: DC

## 2015-09-05 ENCOUNTER — Encounter: Payer: Self-pay | Admitting: Family Medicine

## 2015-09-05 ENCOUNTER — Ambulatory Visit (INDEPENDENT_AMBULATORY_CARE_PROVIDER_SITE_OTHER): Payer: Medicare Other | Admitting: Family Medicine

## 2015-09-05 VITALS — BP 124/82 | HR 76 | Temp 98.1°F | Resp 17 | Ht 59.0 in | Wt 154.4 lb

## 2015-09-05 DIAGNOSIS — R238 Other skin changes: Secondary | ICD-10-CM | POA: Diagnosis not present

## 2015-09-05 DIAGNOSIS — R2240 Localized swelling, mass and lump, unspecified lower limb: Secondary | ICD-10-CM | POA: Diagnosis not present

## 2015-09-05 LAB — BASIC METABOLIC PANEL
BUN: 21 mg/dL (ref 6–23)
CHLORIDE: 106 meq/L (ref 96–112)
CO2: 30 mEq/L (ref 19–32)
CREATININE: 0.95 mg/dL (ref 0.40–1.20)
Calcium: 9.4 mg/dL (ref 8.4–10.5)
GFR: 61.79 mL/min (ref >60.00)
Glucose, Bld: 120 mg/dL — ABNORMAL HIGH (ref 70–99)
POTASSIUM: 4.3 meq/L (ref 3.5–5.1)
Sodium: 141 mEq/L (ref 135–145)

## 2015-09-05 MED ORDER — CEPHALEXIN 500 MG PO CAPS: 500.0000 mg | ORAL_CAPSULE | Freq: Three times a day (TID) | ORAL | Status: AC

## 2015-09-05 NOTE — Progress Notes (Signed)
Pre visit review using our clinic review tool, if applicable. No additional management support is needed unless otherwise documented below in the visit note. 

## 2015-09-05 NOTE — Patient Instructions (Signed)
Follow up in 1 month to recheck cholesterol (please arrive fasting) We'll notify you of your lab results and make any changes if needed Continue the Lasix daily Start wearing the compression hose daily Elevate your legs as much as possible Start the Keflex 3x/day We'll call you with your derm appt Call with any questions or concerns Hang in there!!!

## 2015-09-05 NOTE — Assessment & Plan Note (Signed)
Pt continues to have redness and discomfort.  No evidence of DVT.  Retreat w/ keflex for possible cellulitis.  If no improvement, follow through on Derm referral for pt to assess for other causes of color change.  Pt expressed understanding and is in agreement w/ plan.

## 2015-09-05 NOTE — Assessment & Plan Note (Signed)
Improved since last visit but not resolved.  Pt w/o evidence of DVT.  Taking Lasix w/o difficulty.  Unclear as to why she still has unilateral leg swelling.  Will re-treat w/ keflex for possible cellulitis and start compression hose (prescription given).

## 2015-09-05 NOTE — Progress Notes (Signed)
   Subjective:    Patient ID: Heather Gallegos, female    DOB: [birth year], 71 y.o.   MRN: [id number]  HPI Leg swelling- swelling of L leg much improved.  Continues to have some lower leg redness.  Pain has also improved.  There was no evidence of DVT on [place].  She continues to have tightness of lower leg.  No SOB.  No drainage from lower leg.  No itching or burning.   Review of Systems For ROS see HPI     Objective:   Physical Exam  Constitutional: She is oriented to person, place, and time. She appears well-developed and well-nourished. No distress.  HENT:  Head: Normocephalic and atraumatic.  Pulmonary/Chest: Effort normal and breath sounds normal. No respiratory distress.  Musculoskeletal: She exhibits edema (nonpitting edema of L lower leg to midshin). She exhibits no tenderness.  Neurological: She is alert and oriented to person, place, and time.  Skin: Skin is warm and dry. There is erythema (circumferential redness of L lower leg but improved since last visit).  Psychiatric: She has a normal mood and affect. Her behavior is normal. Thought content normal.  Vitals reviewed.         Assessment & Plan:

## 2015-09-06 ENCOUNTER — Other Ambulatory Visit (INDEPENDENT_AMBULATORY_CARE_PROVIDER_SITE_OTHER): Payer: Medicare Other

## 2015-09-06 DIAGNOSIS — R739 Hyperglycemia, unspecified: Secondary | ICD-10-CM

## 2015-09-06 LAB — HEMOGLOBIN A1C: Hgb A1c MFr Bld: 6.8 % — ABNORMAL HIGH (ref 4.6–6.5)

## 2015-09-13 ENCOUNTER — Encounter: Payer: Self-pay | Admitting: General Practice

## 2015-09-18 DIAGNOSIS — I872 Venous insufficiency (chronic) (peripheral): Secondary | ICD-10-CM | POA: Diagnosis not present

## 2015-09-18 DIAGNOSIS — L03116 Cellulitis of left lower limb: Secondary | ICD-10-CM | POA: Diagnosis not present

## 2015-10-02 ENCOUNTER — Ambulatory Visit (INDEPENDENT_AMBULATORY_CARE_PROVIDER_SITE_OTHER): Payer: Medicare Other | Admitting: Family Medicine

## 2015-10-02 ENCOUNTER — Encounter: Payer: Self-pay | Admitting: Family Medicine

## 2015-10-02 VITALS — BP 138/86 | HR 66 | Temp 98.5°F | Resp 16 | Ht 59.0 in | Wt 154.1 lb

## 2015-10-02 DIAGNOSIS — E785 Hyperlipidemia, unspecified: Secondary | ICD-10-CM | POA: Diagnosis not present

## 2015-10-02 NOTE — Progress Notes (Signed)
   Subjective:    Patient ID: Heather Gallegos, female    DOB: 11/30/1945, 70 y.o.   MRN: [id number]  HPI Hyperlipidemia- chronic problem, on Simvastatin 40mg  daily.   On Fish Oil.  Denies CP, SOB, HAs, visual changes, abd pain, N/V, myalgias.   Review of Systems For ROS see HPI     Objective:   Physical Exam  Constitutional: She is oriented to person, place, and time. She appears well-developed and well-nourished. No distress.  HENT:  Head: Normocephalic and atraumatic.  Eyes: Conjunctivae and EOM are normal. Pupils are equal, round, and reactive to light.  Neck: Normal range of motion. Neck supple. No thyromegaly present.  Cardiovascular: Normal rate, regular rhythm, normal heart sounds and intact distal pulses.   No murmur heard. Pulmonary/Chest: Effort normal and breath sounds normal. No respiratory distress.  Abdominal: Soft. She exhibits no distension. There is no tenderness.  Musculoskeletal: She exhibits no edema.  Lymphadenopathy:    She has no cervical adenopathy.  Neurological: She is alert and oriented to person, place, and time.  Skin: Skin is warm and dry.  Psychiatric: She has a normal mood and affect. Her behavior is normal.  Vitals reviewed.         Assessment & Plan:

## 2015-10-02 NOTE — Progress Notes (Signed)
Pre visit review using our clinic review tool, if applicable. No additional management support is needed unless otherwise documented below in the visit note. 

## 2015-10-02 NOTE — Assessment & Plan Note (Signed)
Chronic problem.  Tolerating statin w/o difficulty.  Check labs.  Adjust meds prn  

## 2015-10-02 NOTE — Patient Instructions (Signed)
Schedule a lab visit for tomorrow AM- please arrive fasting Schedule your complete physical in 6 months Keep up the good work on healthy diet and regular activity- you look great!!! Call with any questions or concerns Have an amazing time in [place]!!!

## 2015-10-03 ENCOUNTER — Other Ambulatory Visit (INDEPENDENT_AMBULATORY_CARE_PROVIDER_SITE_OTHER): Payer: Medicare Other

## 2015-10-03 ENCOUNTER — Encounter (INDEPENDENT_AMBULATORY_CARE_PROVIDER_SITE_OTHER): Payer: Self-pay

## 2015-10-03 DIAGNOSIS — E785 Hyperlipidemia, unspecified: Secondary | ICD-10-CM | POA: Diagnosis not present

## 2015-10-04 ENCOUNTER — Encounter: Payer: Self-pay | Admitting: General Practice

## 2015-10-04 LAB — LIPID PANEL
CHOLESTEROL: 132 mg/dL (ref 0–200)
HDL: 41.2 mg/dL (ref >39.00)
LDL CALC: 62 mg/dL (ref 0–99)
NonHDL: 90.32
TRIGLYCERIDES: 141 mg/dL (ref 0.0–149.0)
Total CHOL/HDL Ratio: 3
VLDL: 28.2 mg/dL (ref 0.0–40.0)

## 2015-10-04 LAB — HEPATIC FUNCTION PANEL
ALT: 20 U/L (ref 0–35)
AST: 22 U/L (ref 0–37)
Albumin: 4 g/dL (ref 3.5–5.2)
Alkaline Phosphatase: 54 U/L (ref 39–117)
Bilirubin, Direct: 0.1 mg/dL (ref 0.0–0.3)
Total Bilirubin: 0.5 mg/dL (ref 0.2–1.2)
Total Protein: 6.4 g/dL (ref 6.0–8.3)

## 2015-10-04 LAB — BASIC METABOLIC PANEL
BUN: 18 mg/dL (ref 6–23)
CHLORIDE: 106 meq/L (ref 96–112)
CO2: 26 mEq/L (ref 19–32)
Calcium: 9.3 mg/dL (ref 8.4–10.5)
Creatinine, Ser: 0.92 mg/dL (ref 0.40–1.20)
GFR: 64.11 mL/min (ref >60.00)
Glucose, Bld: 117 mg/dL — ABNORMAL HIGH (ref 70–99)
POTASSIUM: 4.5 meq/L (ref 3.5–5.1)
Sodium: 141 mEq/L (ref 135–145)

## 2015-11-22 DIAGNOSIS — A498 Other bacterial infections of unspecified site: Secondary | ICD-10-CM

## 2015-11-22 HISTORY — DX: Other bacterial infections of unspecified site: A49.8

## 2015-11-28 ENCOUNTER — Ambulatory Visit (INDEPENDENT_AMBULATORY_CARE_PROVIDER_SITE_OTHER): Payer: Medicare Other | Admitting: Obstetrics and Gynecology

## 2015-11-28 ENCOUNTER — Encounter: Payer: Self-pay | Admitting: Obstetrics and Gynecology

## 2015-11-28 VITALS — BP 130/70 | HR 76 | Resp 14 | Ht 59.0 in | Wt 153.0 lb

## 2015-11-28 DIAGNOSIS — M8580 Other specified disorders of bone density and structure, unspecified site: Secondary | ICD-10-CM | POA: Diagnosis not present

## 2015-11-28 DIAGNOSIS — E2839 Other primary ovarian failure: Secondary | ICD-10-CM

## 2015-11-28 DIAGNOSIS — N3946 Mixed incontinence: Secondary | ICD-10-CM

## 2015-11-28 DIAGNOSIS — N819 Female genital prolapse, unspecified: Secondary | ICD-10-CM | POA: Diagnosis not present

## 2015-11-28 DIAGNOSIS — Z0000 Encounter for general adult medical examination without abnormal findings: Secondary | ICD-10-CM

## 2015-11-28 DIAGNOSIS — Z124 Encounter for screening for malignant neoplasm of cervix: Secondary | ICD-10-CM

## 2015-11-28 DIAGNOSIS — Z01419 Encounter for gynecological examination (general) (routine) without abnormal findings: Secondary | ICD-10-CM | POA: Diagnosis not present

## 2015-11-28 DIAGNOSIS — R011 Cardiac murmur, unspecified: Secondary | ICD-10-CM | POA: Diagnosis not present

## 2015-11-28 NOTE — Progress Notes (Signed)
70 y.o. G2P2 MarriedCaucasianF here for annual exam.  The patient has a h/o uterine prolapse, tried pessaries, they didn't work. She only notices it sometimes when she is sitting, not bothering her. No bleeding. Sexually active, no pain.  She went to PT which helped some with her incontinence. The mixed incontinence is tolerable, she wears a mini-pad. Mostly feels she empties her bladder.     Patient's last menstrual period was 03/23/1998.          Sexually active: Yes.    The current method of family planning is post menopausal status.    Exercising: No.  The patient does not participate in regular exercise at present. Smoker:  no  Health Maintenance: Pap:  10-24-14 WNL NEG HR HPV History of abnormal Pap:  yes MMG:  12-14-14 WNL Colonoscopy:  2016 polyps repeat in 5 years  BMD:   11-04-11 Low Bone Mass TDaP:  12-23-09 Gardasil: N/A   reports that she has never smoked. She has never used smokeless tobacco. She reports that she does not drink alcohol or use drugs.2 sons, 4 grandchildren. One son and granddaughter are local others are in [place].   Past Medical History:  Diagnosis Date  . Chronic sinusitis   . Elevated cholesterol   . Environmental allergies   . Fibroid   . Leg edema, left   . Low back pain   . Uterine prolapse 2010    Past Surgical History:  Procedure Laterality Date  . TONSILLECTOMY AND ADENOIDECTOMY      Current Outpatient Prescriptions  Medication Sig Dispense Refill  . b complex vitamins tablet Take 1 tablet by mouth daily.    . Biotin 1000 MCG tablet Take 1,000 mcg by mouth daily.    . Calcium Carbonate (CALCIUM 600 PO) Take by mouth daily.    . Cetirizine HCl (ZYRTEC PO) Take by mouth as needed.    . fish oil-omega-3 fatty acids 1000 MG capsule Take 2 g by mouth daily.    . fluticasone (FLOVENT DISKUS) 50 MCG/BLIST diskus inhaler Inhale 1 puff into the lungs 2 (two) times daily.    . Glucosamine 500 MG CAPS Take by mouth.    . hydrocortisone 1 % lotion  Apply 1 application topically 2 (two) times daily. Reported on 06/11/2015    . LUTEIN PO Take 20 mg by mouth daily.    . simvastatin (ZOCOR) 40 MG tablet Take 40 mg by mouth every evening.    . vitamin C (ASCORBIC ACID) 500 MG tablet Take 500 mg by mouth daily.    . vitamin E 400 UNIT capsule Take 400 Units by mouth daily.     No current facility-administered medications for this visit.     Family History  Problem Relation Age of Onset  . Cancer Mother 71    Stomach cancer  . Hypertension Brother   . Diabetes Brother     Type II  . Osteoporosis Maternal Aunt     Review of Systems  Constitutional: Negative.   HENT: Negative.   Eyes: Negative.   Respiratory: Negative.   Cardiovascular: Negative.   Gastrointestinal: Negative.   Endocrine: Negative.   Genitourinary: Negative.   Musculoskeletal: Negative.   Skin: Negative.   Allergic/Immunologic: Negative.   Neurological: Negative.   Psychiatric/Behavioral: Negative.     Exam:   BP 130/70 (BP Location: Right Arm, Patient Position: Sitting, Cuff Size: Normal)   Pulse 76   Resp 14   Ht 4\' 11"  (1.499 m)   Wt 153  lb (69.4 kg)   LMP 03/23/1998   BMI 30.90 kg/m   Weight change: @WEIGHTCHANGE @ Height:   Height: 4\' 11"  (149.9 cm)  Ht Readings from Last 3 Encounters:  11/28/15 4\' 11"  (1.499 m)  10/02/15 4\' 11"  (1.499 m)  09/05/15 4\' 11"  (1.499 m)    General appearance: alert, cooperative and appears stated age Head: Normocephalic, without obvious abnormality, atraumatic Neck: no adenopathy, supple, symmetrical, trachea midline and thyroid normal to inspection and palpation Lungs: clear to auscultation bilaterally Breasts: normal appearance, no masses or tenderness Heart: regular rate and rhythm, she has a 99991111 systolic murmur, loudest at the right intercostal space. Abdomen: soft, non-tender; bowel sounds normal; no masses,  no organomegaly Extremities: extremities normal, atraumatic, no cyanosis or edema Skin: Skin color,  texture, turgor normal. No rashes or lesions Lymph nodes: Cervical, supraclavicular, and axillary nodes normal. No abnormal inguinal nodes palpated Neurologic: Grossly normal   Pelvic: External genitalia:  no lesions              Urethra:  normal appearing urethra with no masses, tenderness or lesions              Bartholins and Skenes: normal                 Vagina: normal appearing vagina with mild atrophy. She has a grade 2 uterine prolapse and a grade 1 cystocele              Cervix: no lesions               Bimanual Exam:  Uterus:  normal size, contour, position, consistency, mobility, non-tender              Adnexa: no mass, fullness, tenderness               Rectovaginal: Confirms               Anus:  normal sphincter tone, no lesions  Chaperone was present for exam.  A:  Well Woman with normal exam  Prolapse, tolerable to her (grade 2 uterine, grade 1 cystocele)  Mixed incontinence, tolerable to her  Osteopenia  Heart murmur, new finding    P:   No pap this year  She will call if her incontinence or prolapse becomes bothersome  DEXA  Mammogram  Colonoscopy UTD  Discussed breast self exam  Discussed calcium and vit D intake  Will check a vit D level  F/U with primary for evaluation of heart murmur

## 2015-11-28 NOTE — Patient Instructions (Signed)

## 2015-11-29 LAB — VITAMIN D 25 HYDROXY (VIT D DEFICIENCY, FRACTURES): VIT D 25 HYDROXY: 76 ng/mL (ref 30–100)

## 2015-12-05 ENCOUNTER — Encounter: Payer: Self-pay | Admitting: Family Medicine

## 2015-12-05 ENCOUNTER — Ambulatory Visit (INDEPENDENT_AMBULATORY_CARE_PROVIDER_SITE_OTHER): Payer: Medicare Other | Admitting: Family Medicine

## 2015-12-05 VITALS — BP 132/74 | HR 75 | Temp 98.0°F | Resp 16 | Ht 59.0 in | Wt 156.0 lb

## 2015-12-05 DIAGNOSIS — Z23 Encounter for immunization: Secondary | ICD-10-CM

## 2015-12-05 DIAGNOSIS — R238 Other skin changes: Secondary | ICD-10-CM | POA: Diagnosis not present

## 2015-12-05 DIAGNOSIS — R011 Cardiac murmur, unspecified: Secondary | ICD-10-CM

## 2015-12-05 NOTE — Patient Instructions (Signed)
Follow up as scheduled/needed We'll call you with your ECHO and Derm appts Call with any questions or concerns Happy Fall!!!

## 2015-12-05 NOTE — Progress Notes (Signed)
Pre visit review using our clinic review tool, if applicable. No additional management support is needed unless otherwise documented below in the visit note. 

## 2015-12-05 NOTE — Progress Notes (Signed)
   Subjective:    Patient ID: Heather Gallegos, female    DOB: 1945-06-26, 70 y.o.   MRN: [id number]  HPI ? Hear murmur- pt was at GYN last week (Dr Otho Najjar) and they thought there was a murmur on R side.  No CP, SOB, palpitations.  No hx of murmur.   Review of Systems For ROS see HPI     Objective:   Physical Exam  Constitutional: She is oriented to person, place, and time. She appears well-developed and well-nourished. No distress.  HENT:  Head: Normocephalic and atraumatic.  Eyes: Conjunctivae and EOM are normal. Pupils are equal, round, and reactive to light.  Neck: Normal range of motion. Neck supple. No thyromegaly present.  Cardiovascular: Normal rate, regular rhythm and intact distal pulses.   Murmur: possible I/VI SEM over RUSB when pt holds breath. Pulmonary/Chest: Effort normal and breath sounds normal. No respiratory distress.  Abdominal: Soft. She exhibits no distension. There is no tenderness.  Musculoskeletal: She exhibits no edema.  Lymphadenopathy:    She has no cervical adenopathy.  Neurological: She is alert and oriented to person, place, and time.  Skin: Skin is warm and dry. There is erythema (mild redness of L lower leg).  Psychiatric: She has a normal mood and affect. Her behavior is normal.  Vitals reviewed.         Assessment & Plan:  ? Murmur- possible I/VI SEM heard at RUSB when pt holds her breath.  If pt was not told by GYN that murmur was present would not have heard and still not sure that it's actually there.  Pt is asymptomatic.  Get ECHO to assess.  Skin changes- refer back to derm to see MD (pt saw PA last time and would like to see MD for her opinion)

## 2015-12-09 ENCOUNTER — Telehealth: Payer: Self-pay | Admitting: Family Medicine

## 2015-12-09 DIAGNOSIS — I872 Venous insufficiency (chronic) (peripheral): Secondary | ICD-10-CM | POA: Diagnosis not present

## 2015-12-09 DIAGNOSIS — L810 Postinflammatory hyperpigmentation: Secondary | ICD-10-CM | POA: Diagnosis not present

## 2015-12-09 DIAGNOSIS — Z23 Encounter for immunization: Secondary | ICD-10-CM | POA: Diagnosis not present

## 2015-12-09 NOTE — Telephone Encounter (Signed)
Unlikely to be from the flu shot and is most likely a viral illness that is going around the community.  She should take tylenol/ibuprofen as needed for fever or body aches and immodium as needed for the diarrhea and if things worsen or fail to improve we'd be happy to see her

## 2015-12-09 NOTE — Telephone Encounter (Signed)
Pt states that she received a flu shot last wk and now has diarrhea and a low grade fever, pt is asking if this is due to the shot or does she need an appt to be seen.

## 2015-12-09 NOTE — Telephone Encounter (Signed)
Patient notified of PCP recommendations and is agreement and expresses an understanding.  

## 2015-12-23 ENCOUNTER — Encounter: Payer: Self-pay | Admitting: Family Medicine

## 2015-12-23 ENCOUNTER — Other Ambulatory Visit: Payer: Self-pay | Admitting: Family Medicine

## 2015-12-23 ENCOUNTER — Ambulatory Visit (INDEPENDENT_AMBULATORY_CARE_PROVIDER_SITE_OTHER): Payer: Medicare Other | Admitting: Family Medicine

## 2015-12-23 VITALS — BP 130/72 | HR 80 | Temp 100.1°F | Resp 17 | Ht 59.0 in | Wt 148.0 lb

## 2015-12-23 DIAGNOSIS — R197 Diarrhea, unspecified: Secondary | ICD-10-CM

## 2015-12-23 DIAGNOSIS — A09 Infectious gastroenteritis and colitis, unspecified: Secondary | ICD-10-CM

## 2015-12-23 LAB — BASIC METABOLIC PANEL
BUN: 12 mg/dL (ref 6–23)
CHLORIDE: 103 meq/L (ref 96–112)
CO2: 25 mEq/L (ref 19–32)
Calcium: 8.7 mg/dL (ref 8.4–10.5)
Creatinine, Ser: 0.86 mg/dL (ref 0.40–1.20)
GFR: 69.25 mL/min (ref >60.00)
Glucose, Bld: 112 mg/dL — ABNORMAL HIGH (ref 70–99)
POTASSIUM: 4.4 meq/L (ref 3.5–5.1)
SODIUM: 138 meq/L (ref 135–145)

## 2015-12-23 LAB — CBC WITH DIFFERENTIAL/PLATELET
BASOS PCT: 0.7 % (ref 0.0–3.0)
Basophils Absolute: 0.1 10*3/uL (ref 0.0–0.1)
EOS PCT: 0.6 % (ref 0.0–5.0)
Eosinophils Absolute: 0.1 10*3/uL (ref 0.0–0.7)
HEMATOCRIT: 35.9 % — AB (ref 36.0–46.0)
HEMOGLOBIN: 12.1 g/dL (ref 12.0–15.0)
LYMPHS PCT: 8.1 % — AB (ref 12.0–46.0)
Lymphs Abs: 1.3 10*3/uL (ref 0.7–4.0)
MCHC: 33.8 g/dL (ref 30.0–36.0)
MCV: 87.3 fl (ref 78.0–100.0)
MONO ABS: 1.3 10*3/uL — AB (ref 0.1–1.0)
MONOS PCT: 7.7 % (ref 3.0–12.0)
Neutro Abs: 13.6 10*3/uL — ABNORMAL HIGH (ref 1.4–7.7)
Neutrophils Relative %: 82.9 % — ABNORMAL HIGH (ref 43.0–77.0)
Platelets: 464 10*3/uL — ABNORMAL HIGH (ref 150.0–400.0)
RBC: 4.11 Mil/uL (ref 3.87–5.11)
RDW: 13.4 % (ref 11.5–15.5)
WBC: 16.4 10*3/uL — AB (ref 4.0–10.5)

## 2015-12-23 MED ORDER — DICYCLOMINE HCL 10 MG PO CAPS: 10.0000 mg | ORAL_CAPSULE | Freq: Three times a day (TID) | ORAL | 0 refills | Status: DC

## 2015-12-23 NOTE — Progress Notes (Signed)
Pre visit review using our clinic review tool, if applicable. No additional management support is needed unless otherwise documented below in the visit note. 

## 2015-12-23 NOTE — Patient Instructions (Signed)
Follow up as needed We'll notify you of your lab results and make any changes if needed Continue to drink plenty of fluids to avoid dehydration Since this is concerning for an infectious process, hold off on Immodium at this time (as this will slow the body's natural expulsion of toxins) Follow a BRAT diet- bananas, rice, apple sauce, toast, mashed potatoes, etc.  AVOID DAIRY as this is hard for the gut to process Call with any questions or concerns Hang in there!!!

## 2015-12-23 NOTE — Addendum Note (Signed)
Addended by: Midge Minium on: 12/23/2015 10:21 AM   Modules accepted: Orders

## 2015-12-23 NOTE — Progress Notes (Addendum)
   Subjective:    Patient ID: Heather Gallegos, female    DOB: [birth year], 70 y.o.   MRN: [id number]  HPI Diarrhea- sxs started 2 weeks ago after last appt.  sxs developed 2 hrs after eating lunch.  Had low grade fever, chills, diarrhea.  Taking Immodium daily.  Having 4-5 loose stools/day.  Stools are waking her from sleep.  No other sick contacts.  No blood in stool- described as 'light brown, like lumpy oatmeal w/ mucous'.  Having severe abdominal cramping.  No N/V.  Pt will stool ~30 minutes after eating.     Review of Systems For ROS see HPI     Objective:   Physical Exam  Constitutional: She is oriented to person, place, and time. She appears well-developed and well-nourished. No distress.  HENT:  Head: Normocephalic and atraumatic.  MMM  Neck: Neck supple.  Cardiovascular: Normal rate, regular rhythm and intact distal pulses.   Pulmonary/Chest: Effort normal and breath sounds normal. No respiratory distress. She has no wheezes. She has no rales.  Abdominal: Soft. She exhibits no distension. There is no tenderness. There is no rebound.  Hyperactive BS  Lymphadenopathy:    She has no cervical adenopathy.  Neurological: She is alert and oriented to person, place, and time.  Skin: Skin is warm and dry.  Vitals reviewed.         Assessment & Plan:  Diarrhea- pt w/ 2+ weeks of sxs including cramping, diarrhea, and low grade temps.  Given description of the stools there is concern for C diff.  Check labs to r/o infxn, electrolyte abnormality.  Stool studies to identify cause and determine appropriate treatment.  Bentyl prn cramping.  Reviewed supportive care and red flags that should prompt return.  Pt expressed understanding and is in agreement w/ plan.

## 2015-12-24 ENCOUNTER — Other Ambulatory Visit: Payer: Self-pay | Admitting: General Practice

## 2015-12-24 LAB — C. DIFFICILE GDH AND TOXIN A/B
C. difficile GDH: DETECTED — AB
C. difficile Toxin A/B: DETECTED — AB

## 2015-12-24 MED ORDER — CIPROFLOXACIN HCL 500 MG PO TABS: 500.0000 mg | ORAL_TABLET | Freq: Two times a day (BID) | ORAL | 0 refills | Status: DC

## 2015-12-24 MED ORDER — METRONIDAZOLE 500 MG PO TABS: 500.0000 mg | ORAL_TABLET | Freq: Three times a day (TID) | ORAL | 0 refills | Status: DC

## 2015-12-27 LAB — STOOL CULTURE

## 2016-01-01 ENCOUNTER — Ambulatory Visit (HOSPITAL_BASED_OUTPATIENT_CLINIC_OR_DEPARTMENT_OTHER): Payer: Medicare Other

## 2016-01-06 ENCOUNTER — Ambulatory Visit (HOSPITAL_BASED_OUTPATIENT_CLINIC_OR_DEPARTMENT_OTHER)
Admission: RE | Admit: 2016-01-06 | Discharge: 2016-01-06 | Disposition: A | Discharge status: Home / Self Care | Payer: Medicare Other | Source: Ambulatory Visit | Attending: Obstetrics and Gynecology | Admitting: Obstetrics and Gynecology

## 2016-01-06 DIAGNOSIS — M85862 Other specified disorders of bone density and structure, left lower leg: Secondary | ICD-10-CM | POA: Diagnosis not present

## 2016-01-06 DIAGNOSIS — M8580 Other specified disorders of bone density and structure, unspecified site: Secondary | ICD-10-CM

## 2016-01-06 DIAGNOSIS — E2839 Other primary ovarian failure: Secondary | ICD-10-CM | POA: Diagnosis not present

## 2016-01-06 DIAGNOSIS — M85852 Other specified disorders of bone density and structure, left thigh: Secondary | ICD-10-CM | POA: Diagnosis not present

## 2016-01-08 ENCOUNTER — Ambulatory Visit (HOSPITAL_BASED_OUTPATIENT_CLINIC_OR_DEPARTMENT_OTHER)
Admission: RE | Admit: 2016-01-08 | Discharge: 2016-01-08 | Disposition: A | Discharge status: Home / Self Care | Payer: Medicare Other | Source: Ambulatory Visit | Attending: Family Medicine | Admitting: Family Medicine

## 2016-01-08 DIAGNOSIS — I5030 Unspecified diastolic (congestive) heart failure: Secondary | ICD-10-CM | POA: Diagnosis not present

## 2016-01-08 DIAGNOSIS — R011 Cardiac murmur, unspecified: Secondary | ICD-10-CM | POA: Insufficient documentation

## 2016-01-08 DIAGNOSIS — I071 Rheumatic tricuspid insufficiency: Secondary | ICD-10-CM | POA: Insufficient documentation

## 2016-01-08 NOTE — Progress Notes (Signed)
  Echocardiogram 2D Echocardiogram has been performed.  Darlina Sicilian M 01/08/2016, 3:33 PM

## 2016-01-09 ENCOUNTER — Telehealth: Payer: Self-pay | Admitting: *Deleted

## 2016-01-09 NOTE — Progress Notes (Signed)
Called pt and lmovm to return call.

## 2016-01-09 NOTE — Telephone Encounter (Signed)
-----   Message from Salvadore Dom, MD sent at 01/08/2016  9:19 PM EDT ----- Please inform the patient that she has very mild osteopenia. She should continue to get 1,200 mg of calcium a day, continue with current vit D (I'm guessing she is getting some with her calcium, she has a normal vit D level), participate in weight bearing exercise. F/U DEXA in 5 years.

## 2016-01-09 NOTE — Telephone Encounter (Signed)
Left message for patient to call regarding results -eh 

## 2016-01-09 NOTE — Telephone Encounter (Signed)
Spoke with patient and informed of DEXA scan results. She voiced understanding -eh

## 2016-01-16 ENCOUNTER — Ambulatory Visit (INDEPENDENT_AMBULATORY_CARE_PROVIDER_SITE_OTHER): Payer: Medicare Other | Admitting: Obstetrics and Gynecology

## 2016-01-16 ENCOUNTER — Other Ambulatory Visit: Payer: Self-pay | Admitting: Obstetrics and Gynecology

## 2016-01-16 ENCOUNTER — Encounter: Payer: Self-pay | Admitting: Obstetrics and Gynecology

## 2016-01-16 VITALS — BP 140/80 | HR 96 | Temp 98.2°F | Resp 16 | Ht 59.0 in | Wt 146.0 lb

## 2016-01-16 DIAGNOSIS — Z1159 Encounter for screening for other viral diseases: Secondary | ICD-10-CM | POA: Diagnosis not present

## 2016-01-16 DIAGNOSIS — Z1231 Encounter for screening mammogram for malignant neoplasm of breast: Secondary | ICD-10-CM

## 2016-01-16 DIAGNOSIS — N760 Acute vaginitis: Secondary | ICD-10-CM

## 2016-01-16 MED ORDER — METRONIDAZOLE 500 MG PO TABS: 500.0000 mg | ORAL_TABLET | Freq: Two times a day (BID) | ORAL | 0 refills | Status: DC

## 2016-01-16 NOTE — Progress Notes (Signed)
GYNECOLOGY  VISIT   HPI: 70 y.o.   Married  Caucasian  female   G2P2 with Patient's last menstrual period was 03/23/1998.   here for vaginal itching for the last 1.5-2 weeks ago. She was recently treated with antibiotics for Cdiff for 10 days. She tried over the [employer], terrible burning. No abnormal discharge, no vaginal bleeding. Vaginsa feels swollen. The symptoms are more internal than external. No odor.  The patient has a h/o genital prolapse and mixed urinary incontinence.  GYNECOLOGIC HISTORY: Patient's last menstrual period was 03/23/1998. Contraception:post menopausal  Menopausal hormone therapy: None        OB History    Gravida Para Term Preterm AB Living   2 2       2    SAB TAB Ectopic Multiple Live Births                     Patient Active Problem List   Diagnosis Date Noted  . Change of skin color 09/05/2015  . Localized swelling of lower leg 08/28/2015  . Hyperlipidemia   . Uterine prolapse     Past Medical History:  Diagnosis Date  . Chronic sinusitis   . Elevated cholesterol   . Environmental allergies   . Fibroid   . Leg edema, left   . Low back pain   . Uterine prolapse 2010    Past Surgical History:  Procedure Laterality Date  . TONSILLECTOMY AND ADENOIDECTOMY      Current Outpatient Prescriptions  Medication Sig Dispense Refill  . b complex vitamins tablet Take 1 tablet by mouth daily.    . Biotin 1000 MCG tablet Take 1,000 mcg by mouth daily.    . Calcium Carbonate (CALCIUM 600 PO) Take by mouth daily.    . Cetirizine HCl (ZYRTEC PO) Take by mouth as needed.    . dicyclomine (BENTYL) 10 MG capsule Take 1 capsule (10 mg total) by mouth 4 (four) times daily -  before meals and at bedtime. 60 capsule 0  . fish oil-omega-3 fatty acids 1000 MG capsule Take 2 g by mouth daily.    . fluticasone (FLONASE) 50 MCG/ACT nasal spray     . fluticasone (FLOVENT DISKUS) 50 MCG/BLIST diskus inhaler Inhale 1 puff into the lungs 2 (two) times daily.     . Glucosamine 500 MG CAPS Take by mouth.    . hydrocortisone 1 % lotion Apply 1 application topically 2 (two) times daily. Reported on 06/11/2015    . LUTEIN PO Take 20 mg by mouth daily.    . simvastatin (ZOCOR) 40 MG tablet Take 40 mg by mouth every evening.    . vitamin C (ASCORBIC ACID) 500 MG tablet Take 500 mg by mouth daily.    . vitamin E 400 UNIT capsule Take 400 Units by mouth daily.     No current facility-administered medications for this visit.      ALLERGIES: Review of patient's allergies indicates no known allergies.  Family History  Problem Relation Age of Onset  . Cancer Mother 74    Stomach cancer  . Hypertension Brother   . Diabetes Brother     Type II  . Osteoporosis Maternal Aunt     Social History   Social History  . Marital status: Married    Spouse name: N/A  . Number of children: N/A  . Years of education: N/A   Occupational History  . Not on file.   Social History Main Topics  .  Smoking status: Never Smoker  . Smokeless tobacco: Never Used  . Alcohol use No  . Drug use: No  . Sexual activity: Yes    Partners: Male    Birth control/ protection: Post-menopausal   Other Topics Concern  . Not on file   Social History Narrative  . No narrative on file    Review of Systems  Constitutional: Negative.   HENT: Positive for congestion.   Eyes: Negative.   Respiratory: Positive for cough.   Cardiovascular: Negative.   Gastrointestinal: Negative.   Genitourinary: Negative.        Vulvar itching Loss of urine with cough or sneeze   Musculoskeletal: Negative.   Skin: Negative.   Neurological: Negative.   Endo/Heme/Allergies: Negative.   Psychiatric/Behavioral: Negative.     PHYSICAL EXAMINATION:    BP 140/80 (BP Location: Right Arm, Patient Position: Sitting, Cuff Size: Normal)   Pulse 96   Temp 98.2 F (36.8 C) (Oral)   Resp 16   Ht 4\' 11"  (1.499 m)   Wt 146 lb (66.2 kg)   LMP 03/23/1998   BMI 29.49 kg/m     General  appearance: alert, cooperative and appears stated age   Pelvic: External genitalia:  no lesions, no significant erythema              Urethra:  normal appearing urethra with no masses, tenderness or lesions              Bartholins and Skenes: normal                 Vagina: normal appearing atrophic vagina with normal color and discharge, no lesions              Cervix: no lesions. Grade 2 uterine prolapse.              Bimanual Exam:  Uterus:  normal size, contour, position, consistency, mobility, non-tender              Adnexa: no mass, fullness, tenderness                Chaperone was present for exam.  Wet prep: + clue, no trich, no wbc KOH: no yeast PH: 5 (possible artifact from the monistat)/   ASSESSMENT Vaginitis, appears to have BV under the microscope    PLAN Will treat with Flagyl Will send wet prep probe as well, ? Low level yeast Can use Vaseline externally Patient requests screening for hepatitis C, will draw   An After Visit Summary was printed and given to the patient.

## 2016-01-16 NOTE — Patient Instructions (Signed)

## 2016-01-17 LAB — WET PREP BY MOLECULAR PROBE
Candida species: NEGATIVE
GARDNERELLA VAGINALIS: POSITIVE — AB
Trichomonas vaginosis: NEGATIVE

## 2016-01-17 LAB — HEPATITIS C ANTIBODY: HCV AB: NEGATIVE

## 2016-02-06 ENCOUNTER — Ambulatory Visit
Admission: RE | Admit: 2016-02-06 | Discharge: 2016-02-06 | Disposition: A | Discharge status: Home / Self Care | Payer: Medicare Other | Source: Ambulatory Visit | Attending: Obstetrics and Gynecology | Admitting: Obstetrics and Gynecology

## 2016-02-06 DIAGNOSIS — Z1231 Encounter for screening mammogram for malignant neoplasm of breast: Secondary | ICD-10-CM

## 2016-02-26 LAB — HM PAP SMEAR

## 2016-04-09 ENCOUNTER — Encounter: Payer: Medicare Other | Admitting: Family Medicine

## 2016-08-21 ENCOUNTER — Other Ambulatory Visit (INDEPENDENT_AMBULATORY_CARE_PROVIDER_SITE_OTHER): Payer: Medicare Other

## 2016-08-21 ENCOUNTER — Encounter: Payer: Self-pay | Admitting: Family Medicine

## 2016-08-21 ENCOUNTER — Other Ambulatory Visit: Payer: Self-pay | Admitting: General Practice

## 2016-08-21 ENCOUNTER — Ambulatory Visit (INDEPENDENT_AMBULATORY_CARE_PROVIDER_SITE_OTHER): Payer: Medicare Other | Admitting: Family Medicine

## 2016-08-21 VITALS — BP 130/81 | HR 76 | Temp 98.4°F | Resp 16 | Ht 59.0 in | Wt 151.2 lb

## 2016-08-21 DIAGNOSIS — N814 Uterovaginal prolapse, unspecified: Secondary | ICD-10-CM | POA: Diagnosis not present

## 2016-08-21 DIAGNOSIS — R7309 Other abnormal glucose: Secondary | ICD-10-CM | POA: Diagnosis not present

## 2016-08-21 DIAGNOSIS — Z0000 Encounter for general adult medical examination without abnormal findings: Secondary | ICD-10-CM | POA: Insufficient documentation

## 2016-08-21 DIAGNOSIS — Z23 Encounter for immunization: Secondary | ICD-10-CM | POA: Diagnosis not present

## 2016-08-21 DIAGNOSIS — E785 Hyperlipidemia, unspecified: Secondary | ICD-10-CM

## 2016-08-21 LAB — BASIC METABOLIC PANEL
BUN: 23 mg/dL (ref 6–23)
CHLORIDE: 106 meq/L (ref 96–112)
CO2: 28 meq/L (ref 19–32)
Calcium: 9.6 mg/dL (ref 8.4–10.5)
Creatinine, Ser: 0.95 mg/dL (ref 0.40–1.20)
GFR: 61.62 mL/min (ref >60.00)
GLUCOSE: 139 mg/dL — AB (ref 70–99)
POTASSIUM: 4.7 meq/L (ref 3.5–5.1)
Sodium: 140 mEq/L (ref 135–145)

## 2016-08-21 LAB — HEMOGLOBIN A1C: HEMOGLOBIN A1C: 6.6 % — AB (ref 4.6–6.5)

## 2016-08-21 LAB — LIPID PANEL
CHOLESTEROL: 154 mg/dL (ref 0–200)
HDL: 45.1 mg/dL (ref >39.00)
LDL CALC: 75 mg/dL (ref 0–99)
NonHDL: 108.84
TRIGLYCERIDES: 170 mg/dL — AB (ref 0.0–149.0)
Total CHOL/HDL Ratio: 3
VLDL: 34 mg/dL (ref 0.0–40.0)

## 2016-08-21 LAB — CBC WITH DIFFERENTIAL/PLATELET
Basophils Absolute: 0 10*3/uL (ref 0.0–0.1)
Basophils Relative: 0.8 % (ref 0.0–3.0)
EOS PCT: 5.4 % — AB (ref 0.0–5.0)
Eosinophils Absolute: 0.3 10*3/uL (ref 0.0–0.7)
HCT: 41.4 % (ref 36.0–46.0)
Hemoglobin: 13.9 g/dL (ref 12.0–15.0)
LYMPHS ABS: 1.6 10*3/uL (ref 0.7–4.0)
Lymphocytes Relative: 29.7 % (ref 12.0–46.0)
MCHC: 33.6 g/dL (ref 30.0–36.0)
MCV: 89.6 fl (ref 78.0–100.0)
MONO ABS: 0.4 10*3/uL (ref 0.1–1.0)
MONOS PCT: 8 % (ref 3.0–12.0)
NEUTROS ABS: 3.1 10*3/uL (ref 1.4–7.7)
NEUTROS PCT: 56.1 % (ref 43.0–77.0)
PLATELETS: 279 10*3/uL (ref 150.0–400.0)
RBC: 4.62 Mil/uL (ref 3.87–5.11)
RDW: 13.5 % (ref 11.5–15.5)
WBC: 5.5 10*3/uL (ref 4.0–10.5)

## 2016-08-21 LAB — TSH: TSH: 3.31 u[IU]/mL (ref 0.35–4.50)

## 2016-08-21 LAB — HEPATIC FUNCTION PANEL
ALK PHOS: 56 U/L (ref 39–117)
ALT: 20 U/L (ref 0–35)
AST: 20 U/L (ref 0–37)
Albumin: 4.3 g/dL (ref 3.5–5.2)
BILIRUBIN DIRECT: 0.1 mg/dL (ref 0.0–0.3)
TOTAL PROTEIN: 6.7 g/dL (ref 6.0–8.3)
Total Bilirubin: 0.4 mg/dL (ref 0.2–1.2)

## 2016-08-21 MED ORDER — SIMVASTATIN 40 MG PO TABS: 40.0000 mg | ORAL_TABLET | Freq: Every evening | ORAL | 1 refills | Status: DC

## 2016-08-21 NOTE — Addendum Note (Signed)
Addended by: Davis Gourd on: 08/21/2016 08:51 AM   Modules accepted: Orders

## 2016-08-21 NOTE — Assessment & Plan Note (Signed)
Pt's PE WNL w/ exception of being overweight.  UTD on colonoscopy, mammo, DEXA.  Prevnar given today.  Written screening schedule updated and given to pt.  Check labs.  Anticipatory guidance provided.

## 2016-08-21 NOTE — Patient Instructions (Addendum)
Follow up in 6 months to recheck cholesterol We'll notify you of your lab results and make any changes if needed Continue to work on healthy diet and regular exercise- you can do it! You are up to date on colonoscopy, mammo, bone density- yay!!! You can get your Shingrix at the pharmacy Call with any questions or concerns Have a great summer!!!

## 2016-08-21 NOTE — Progress Notes (Signed)
Pre visit review using our clinic review tool, if applicable. No additional management support is needed unless otherwise documented below in the visit note. 

## 2016-08-21 NOTE — Progress Notes (Signed)
   Subjective:    Patient ID: Heather Gallegos, female    DOB: 25-Apr-1945, 71 y.o.   MRN: [id number]  HPI Here today for CPE.  Risk Factors: Hyperlipidemia- chronic problem, on Simvastatin.  Denies CP, SOB, HAs, visual changes, edema, abd pain, N/V. Physical Activity: walking regularly Fall Risk: low Depression: denies current sxs Hearing: normal to conversational tone and whispered voice at 6 ft ADL's: independent Cognitive: normal linear thought process, memory and attention intact Home Safety: safe at home Height, Weight, BMI, Visual Acuity: see vitals, vision 20/20 Counseling: UTD on colonoscopy, pap, mammo, DEXA.  Due for Prevnar.  Pt to get Shingrix at pharmacy Care team reviewed and updated Labs Ordered: See A&P Care Plan: See A&P    Review of Systems Patient reports no vision/ hearing changes, adenopathy,fever, weight change,  persistant/recurrent hoarseness , swallowing issues, chest pain, palpitations, edema, persistant/recurrent cough, hemoptysis, dyspnea (rest/exertional/paroxysmal nocturnal), gastrointestinal bleeding (melena, rectal bleeding), abdominal pain, significant heartburn, bowel changes, GU symptoms (dysuria, hematuria, incontinence), Gyn symptoms (abnormal  bleeding, pain),  syncope, focal weakness, memory loss, numbness & tingling, skin/hair/nail changes, abnormal bruising or bleeding, anxiety, or depression.     Objective:   Physical Exam General Appearance:    Alert, cooperative, no distress, appears stated age  Head:    Normocephalic, without obvious abnormality, atraumatic  Eyes:    PERRL, conjunctiva/corneas clear, EOM's intact, fundi    benign, both eyes  Ears:    Normal TM's and external ear canals, both ears  Nose:   Nares normal, septum midline, mucosa normal, no drainage    or sinus tenderness  Throat:   Lips, mucosa, and tongue normal; teeth and gums normal  Neck:   Supple, symmetrical, trachea midline, no adenopathy;    Thyroid: no  enlargement/tenderness/nodules  Back:     Symmetric, no curvature, ROM normal, no CVA tenderness  Lungs:     Clear to auscultation bilaterally, respirations unlabored  Chest Wall:    No tenderness or deformity   Heart:    Regular rate and rhythm, S1 and S2 normal, no murmur, rub   or gallop  Breast Exam:    Deferred to GYN  Abdomen:     Soft, non-tender, bowel sounds active all four quadrants,    no masses, no organomegaly  Genitalia:    Deferred to GYN  Rectal:    Extremities:   Extremities normal, atraumatic, no cyanosis or edema  Pulses:   2+ and symmetric all extremities  Skin:   Skin color, texture, turgor normal, no rashes or lesions  Lymph nodes:   Cervical, supraclavicular, and axillary nodes normal  Neurologic:   CNII-XII intact, normal strength, sensation and reflexes    throughout          Assessment & Plan:

## 2016-08-21 NOTE — Assessment & Plan Note (Signed)
Chronic problem.  Tolerating statin w/o difficulty.  Stressed need for healthy diet and regular exercise.  Check labs.  Adjust meds prn  

## 2016-09-30 DIAGNOSIS — H2513 Age-related nuclear cataract, bilateral: Secondary | ICD-10-CM | POA: Diagnosis not present

## 2016-09-30 DIAGNOSIS — H04123 Dry eye syndrome of bilateral lacrimal glands: Secondary | ICD-10-CM | POA: Diagnosis not present

## 2016-12-03 ENCOUNTER — Ambulatory Visit (INDEPENDENT_AMBULATORY_CARE_PROVIDER_SITE_OTHER): Payer: Medicare Other | Admitting: Obstetrics and Gynecology

## 2016-12-03 ENCOUNTER — Encounter: Payer: Self-pay | Admitting: Obstetrics and Gynecology

## 2016-12-03 VITALS — BP 142/74 | HR 80 | Resp 16 | Ht 59.0 in | Wt 151.0 lb

## 2016-12-03 DIAGNOSIS — N814 Uterovaginal prolapse, unspecified: Secondary | ICD-10-CM

## 2016-12-03 DIAGNOSIS — Z01419 Encounter for gynecological examination (general) (routine) without abnormal findings: Secondary | ICD-10-CM | POA: Diagnosis not present

## 2016-12-03 DIAGNOSIS — Z124 Encounter for screening for malignant neoplasm of cervix: Secondary | ICD-10-CM

## 2016-12-03 DIAGNOSIS — M8580 Other specified disorders of bone density and structure, unspecified site: Secondary | ICD-10-CM | POA: Diagnosis not present

## 2016-12-03 NOTE — Patient Instructions (Signed)

## 2016-12-03 NOTE — Progress Notes (Signed)
71 y.o. G2P2 MarriedCaucasianF here for annual exam.  She c/o mild irritation/itching on the vulva. No vaginal discharge. No bleeding. Sexually active, no pain.  She has intermittent mixed incontinence, not every week.  She had C.diff just over a year ago. On pro-biotics, every once in a while has a flare up.  She has a h/o genital prolapse, not bothersome. Tried a pessary and it was annoying. Doing fine without it. No issues with voiding or BM's.     Patient's last menstrual period was 03/23/1998.          Sexually active: Yes.    The current method of family planning is post menopausal status.    Exercising: No.  The patient does not participate in regular exercise at present. Smoker:  no  Health Maintenance: Pap:  10-24-14 WNL NEG HR HPV History of abnormal Pap:  Yes at least 15-18 years ago BX neg MMG:  02-06-16 WNL  Colonoscopy:  2016 repeat in 5 year BMD:   01-06-16 Osteopenia, mild, f/u DEXA in 5 years   TDaP:  12-23-09 Gardasil: N/A   reports that she has never smoked. She has never used smokeless tobacco. She reports that she does not drink alcohol or use drugs. 2 sons, 5 grandchildren. One son and 3 grandchildren are local others are in [place]. 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   Past Medical History:  Diagnosis Date  . Chronic sinusitis   . Clostridium difficile infection 11/2015  . Elevated cholesterol   . Environmental allergies   . Fibroid   . Leg edema, left   . Low back pain   . Uterine prolapse 2010    Past Surgical History:  Procedure Laterality Date  . TONSILLECTOMY AND ADENOIDECTOMY      Current Outpatient Prescriptions  Medication Sig Dispense Refill  . b complex vitamins tablet Take 1 tablet by mouth daily.    . Biotin 1000 MCG tablet Take 1,000 mcg by mouth daily.    . Calcium Carbonate (CALCIUM 600 PO) Take by mouth daily.    . Cetirizine HCl (ZYRTEC PO) Take by mouth as needed.    . fish oil-omega-3 fatty acids 1000 MG  capsule Take 2 g by mouth daily.    . fluticasone (FLONASE) 50 MCG/ACT nasal spray     . Glucosamine 500 MG CAPS Take by mouth.    . LUTEIN PO Take 20 mg by mouth daily.    . simvastatin (ZOCOR) 40 MG tablet Take 1 tablet (40 mg total) by mouth every evening. 90 tablet 1  . vitamin C (ASCORBIC ACID) 500 MG tablet Take 500 mg by mouth daily.    . vitamin E 400 UNIT capsule Take 400 Units by mouth daily.     No current facility-administered medications for this visit.     Family History  Problem Relation Age of Onset  . Cancer Mother 9       Stomach cancer  . Hypertension Brother   . Diabetes Brother        Type II  . Osteoporosis Maternal Aunt     Review of Systems  Constitutional: Negative.   HENT: Positive for sinus pain and sinus pressure.   Eyes: Negative.   Respiratory: Negative.   Cardiovascular: Negative.   Gastrointestinal: Negative.   Endocrine: Negative.   Genitourinary: Negative.        Vaginal itching   Musculoskeletal: Negative.   Skin: Negative.   Allergic/Immunologic: Negative.   Neurological: Negative.  Psychiatric/Behavioral: Negative.     Exam:   BP (!) 142/74 (BP Location: Right Arm, Patient Position: Sitting, Cuff Size: Normal)   Pulse 80   Resp 16   Ht 4\' 11"  (1.499 m)   Wt 151 lb (68.5 kg)   LMP 03/23/1998   BMI 30.50 kg/m   Weight change: @WEIGHTCHANGE @ Height:   Height: 4\' 11"  (149.9 cm)  Ht Readings from Last 3 Encounters:  12/03/16 4\' 11"  (1.499 m)  08/21/16 4\' 11"  (1.499 m)  01/16/16 4\' 11"  (1.499 m)    General appearance: alert, cooperative and appears stated age Head: Normocephalic, without obvious abnormality, atraumatic Neck: no adenopathy, supple, symmetrical, trachea midline and thyroid normal to inspection and palpation Lungs: clear to auscultation bilaterally Cardiovascular: regular rate and rhythm Breasts: normal appearance, no masses or tenderness. Left nipple inverted (no change) Abdomen: soft, non-tender; bowel sounds  normal; no masses,  no organomegaly Extremities: extremities normal, atraumatic, no cyanosis or edema Skin: Skin color, texture, turgor normal. No rashes or lesions Lymph nodes: Cervical, supraclavicular, and axillary nodes normal. No abnormal inguinal nodes palpated Neurologic: Grossly normal   Pelvic: External genitalia:  no lesions              Urethra:  normal appearing urethra with no masses, tenderness or lesions              Bartholins and Skenes: normal                 Vagina: normal appearing vagina with normal color and discharge, no lesions              Cervix: no lesions  She has a grade 2 uterine prolapse, cervix comes 1 cm past introitus with valsalva. No significant cystocele or rectocele               Bimanual Exam:  Uterus:  normal size, contour, position, consistency, mobility, non-tender              Adnexa: no mass, fullness, tenderness               Rectovaginal: Confirms               Anus:  normal sphincter tone, no lesions  Chaperone was present for exam.  A:  Well Woman with normal exam  Osteopenia  P:   No pap this year  Labs with primary  DEXA in 4 years  Mammogram in 11/18  Colonoscopy UTD  Discussed breast self exam  Discussed calcium and vit D intake

## 2017-01-11 ENCOUNTER — Other Ambulatory Visit: Payer: Self-pay | Admitting: Obstetrics and Gynecology

## 2017-01-11 DIAGNOSIS — Z1231 Encounter for screening mammogram for malignant neoplasm of breast: Secondary | ICD-10-CM

## 2017-02-09 ENCOUNTER — Ambulatory Visit
Admission: RE | Admit: 2017-02-09 | Discharge: 2017-02-09 | Disposition: A | Discharge status: Home / Self Care | Payer: Medicare Other | Source: Ambulatory Visit | Attending: Obstetrics and Gynecology | Admitting: Obstetrics and Gynecology

## 2017-02-09 DIAGNOSIS — Z1231 Encounter for screening mammogram for malignant neoplasm of breast: Secondary | ICD-10-CM

## 2017-02-16 DIAGNOSIS — Z23 Encounter for immunization: Secondary | ICD-10-CM | POA: Diagnosis not present

## 2017-02-25 ENCOUNTER — Ambulatory Visit (INDEPENDENT_AMBULATORY_CARE_PROVIDER_SITE_OTHER): Payer: Medicare Other | Admitting: Family Medicine

## 2017-02-25 ENCOUNTER — Other Ambulatory Visit: Payer: Self-pay

## 2017-02-25 ENCOUNTER — Encounter: Payer: Self-pay | Admitting: Family Medicine

## 2017-02-25 VITALS — BP 121/82 | HR 79 | Temp 98.1°F | Resp 16 | Ht 59.0 in | Wt 150.5 lb

## 2017-02-25 DIAGNOSIS — E785 Hyperlipidemia, unspecified: Secondary | ICD-10-CM

## 2017-02-25 LAB — BASIC METABOLIC PANEL
BUN: 21 mg/dL (ref 6–23)
CALCIUM: 9.4 mg/dL (ref 8.4–10.5)
CHLORIDE: 104 meq/L (ref 96–112)
CO2: 26 meq/L (ref 19–32)
CREATININE: 0.91 mg/dL (ref 0.40–1.20)
GFR: 64.66 mL/min (ref >60.00)
GLUCOSE: 129 mg/dL — AB (ref 70–99)
Potassium: 4.3 mEq/L (ref 3.5–5.1)
Sodium: 139 mEq/L (ref 135–145)

## 2017-02-25 LAB — LIPID PANEL
Cholesterol: 146 mg/dL (ref 0–200)
HDL: 43.9 mg/dL (ref >39.00)
LDL Cholesterol: 69 mg/dL (ref 0–99)
NonHDL: 102.11
TRIGLYCERIDES: 165 mg/dL — AB (ref 0.0–149.0)
Total CHOL/HDL Ratio: 3
VLDL: 33 mg/dL (ref 0.0–40.0)

## 2017-02-25 LAB — HEPATIC FUNCTION PANEL
ALBUMIN: 4.2 g/dL (ref 3.5–5.2)
ALT: 19 U/L (ref 0–35)
AST: 21 U/L (ref 0–37)
Alkaline Phosphatase: 60 U/L (ref 39–117)
Bilirubin, Direct: 0.1 mg/dL (ref 0.0–0.3)
TOTAL PROTEIN: 6.5 g/dL (ref 6.0–8.3)
Total Bilirubin: 0.6 mg/dL (ref 0.2–1.2)

## 2017-02-25 MED ORDER — SIMVASTATIN 40 MG PO TABS: 40.0000 mg | ORAL_TABLET | Freq: Every evening | ORAL | 1 refills | Status: DC

## 2017-02-25 NOTE — Assessment & Plan Note (Signed)
Chronic problem.  Tolerating statin w/o difficulty.  Check labs.  Adjust meds prn  

## 2017-02-25 NOTE — Progress Notes (Signed)
   Subjective:    Patient ID: Heather Gallegos, female    DOB: [birth year], 71 y.o.   MRN: [id number]  HPI Hyperlipidemia- chronic problem, on Simvastatin 40mg  daily.  Weight is stable.  No regular exercise 'but I am moving in that direction'.  No CP, SOB, HAs, visual changes, abd pain, N/V.   Review of Systems For ROS see HPI     Objective:   Physical Exam  Constitutional: She is oriented to person, place, and time. She appears well-developed and well-nourished. No distress.  HENT:  Head: Normocephalic and atraumatic.  Eyes: Conjunctivae and EOM are normal. Pupils are equal, round, and reactive to light.  Neck: Normal range of motion. Neck supple. No thyromegaly present.  Cardiovascular: Normal rate, regular rhythm, normal heart sounds and intact distal pulses.  No murmur heard. Pulmonary/Chest: Effort normal and breath sounds normal. No respiratory distress.  Abdominal: Soft. She exhibits no distension. There is no tenderness.  Musculoskeletal: She exhibits no edema.  Lymphadenopathy:    She has no cervical adenopathy.  Neurological: She is alert and oriented to person, place, and time.  Skin: Skin is warm and dry.  Psychiatric: She has a normal mood and affect. Her behavior is normal.  Vitals reviewed.         Assessment & Plan:

## 2017-02-25 NOTE — Patient Instructions (Signed)
Schedule your Medicare Wellness Visit in 6 months and a follow up with me at the same time [facility] notify you of your lab results and make any changes if needed Continue to work on healthy diet and regular exercise- you can do it! Call with any questions or concerns Happy Holidays!

## 2017-03-02 IMAGING — US US EXTREM LOW VENOUS*L*
1 series · 13 of 24 positions shown · non-contrast
Comparison: None.

CLINICAL DATA: Left lower extremity redness and edema for 5 weeks

EXAM:
LEFT LOWER EXTREMITY VENOUS DUPLEX ULTRASOUND
TECHNIQUE: Gray-scale sonography with graded compression, as well as color
Doppler and duplex ultrasound were performed to evaluate the left
lower extremity deep venous system from the level of the common
femoral vein and including the common femoral, femoral, profunda
femoral, popliteal and calf veins including the posterior tibial,
peroneal and gastrocnemius veins when visible. The superficial great
saphenous vein was also interrogated. Spectral Doppler was utilized
to evaluate flow at rest and with distal augmentation maneuvers in
the common femoral, femoral and popliteal veins.

[Series 1: us extrem low venous*left* · 0.08mm/px · 13 of 29 slices shown]
[im 1/29]
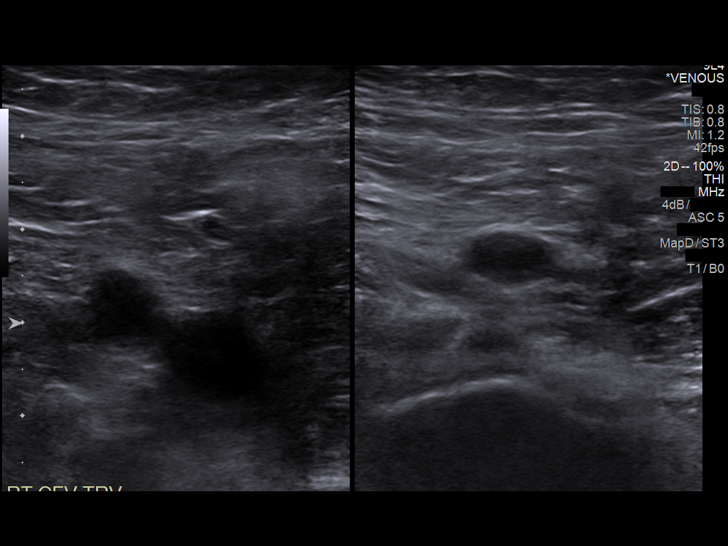
[im 3/29]
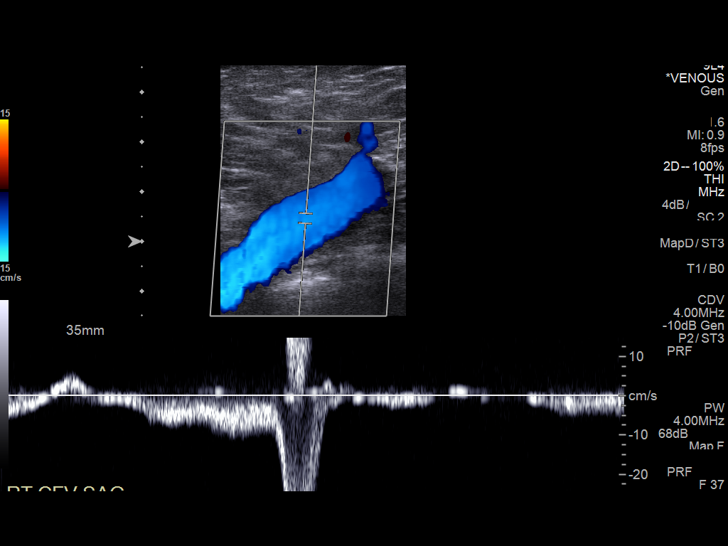
[im 5/29]
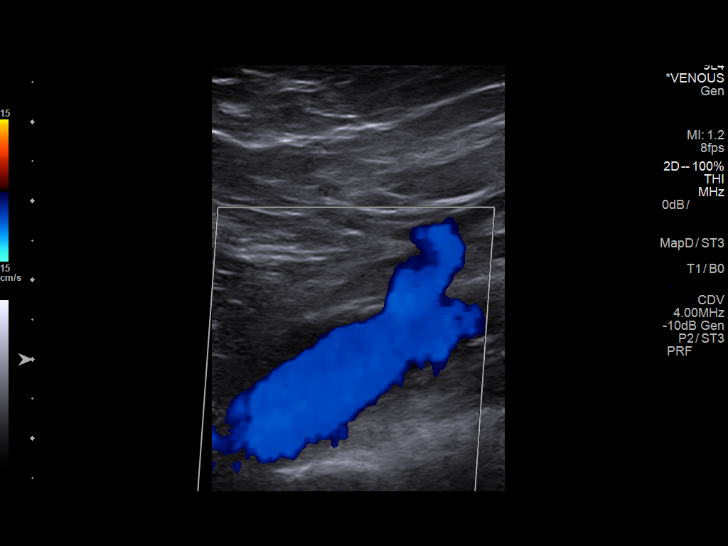
[im 8/29]
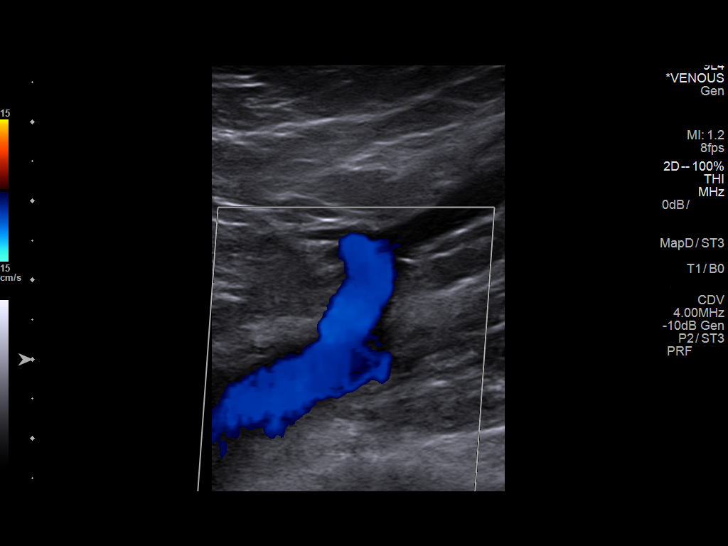
[im 10/29]
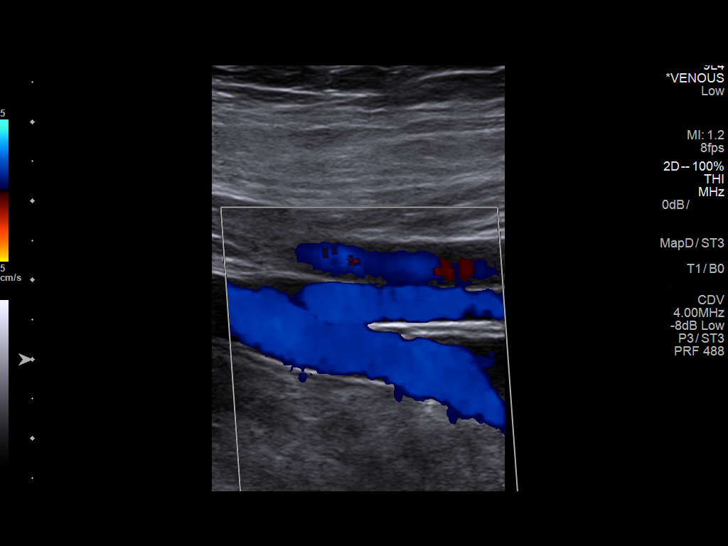
[im 13/29]
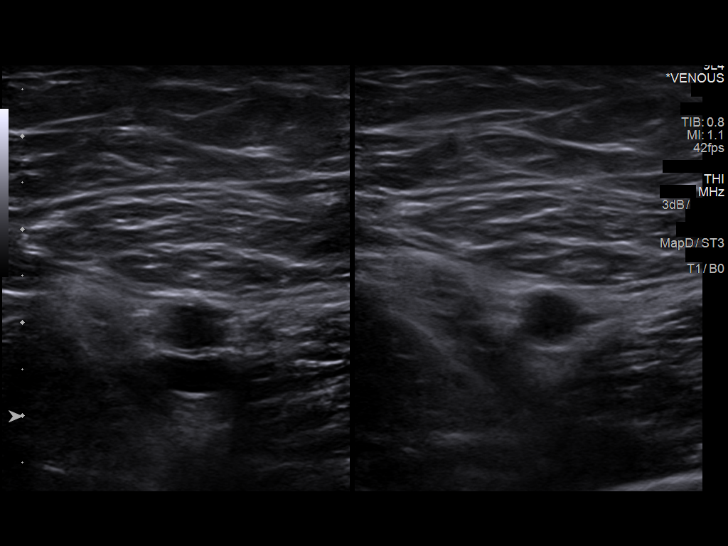
[im 15/29]
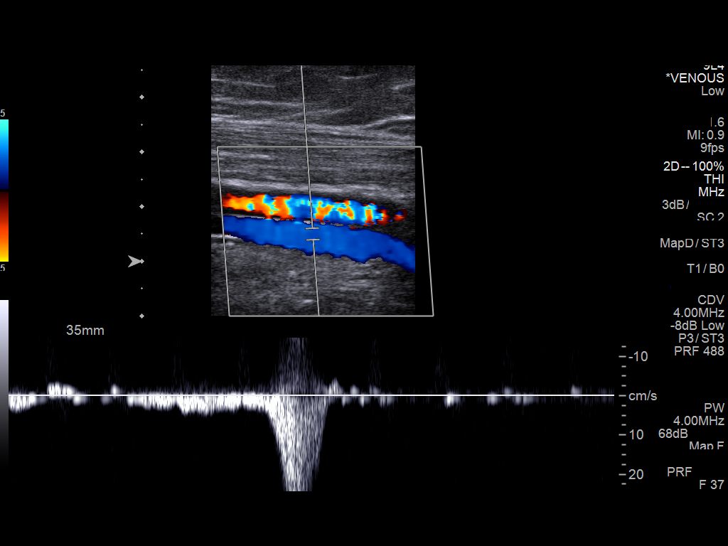
[im 16/29]
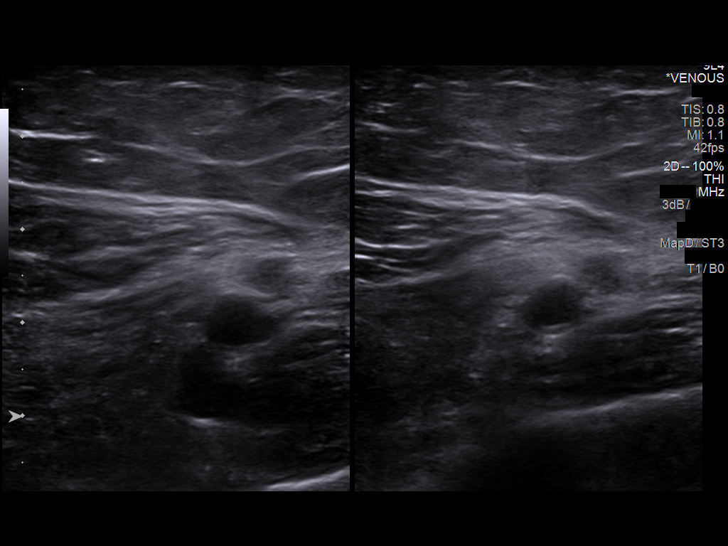
[im 19/29]
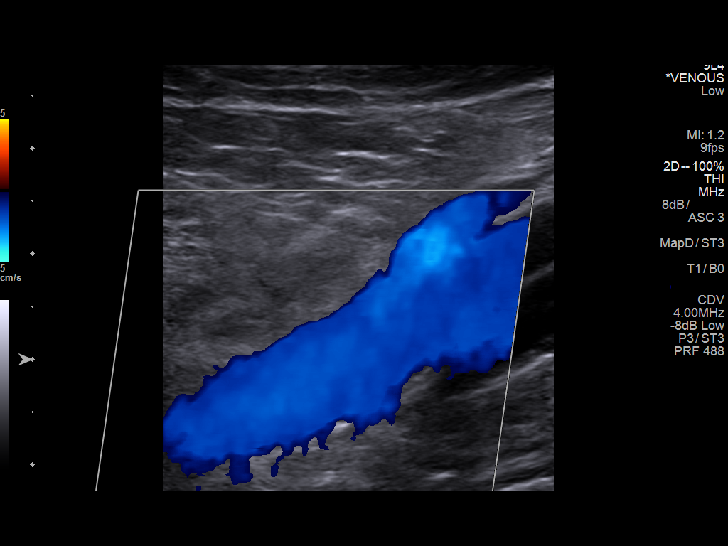
[im 21/29]
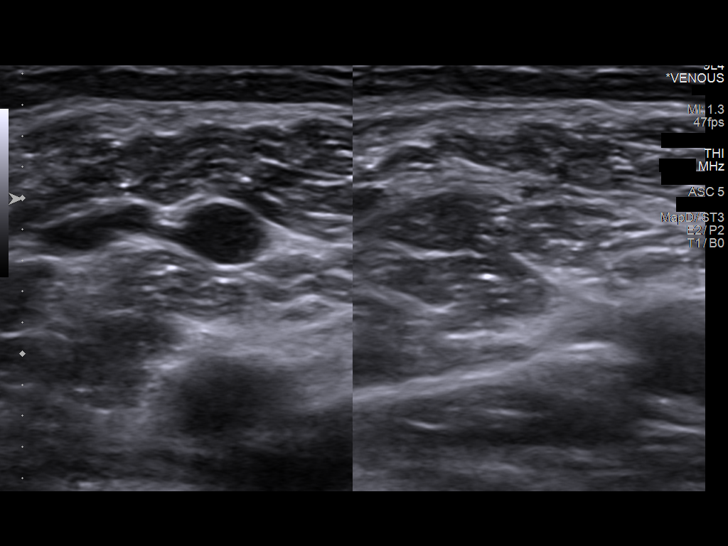
[im 24/29]
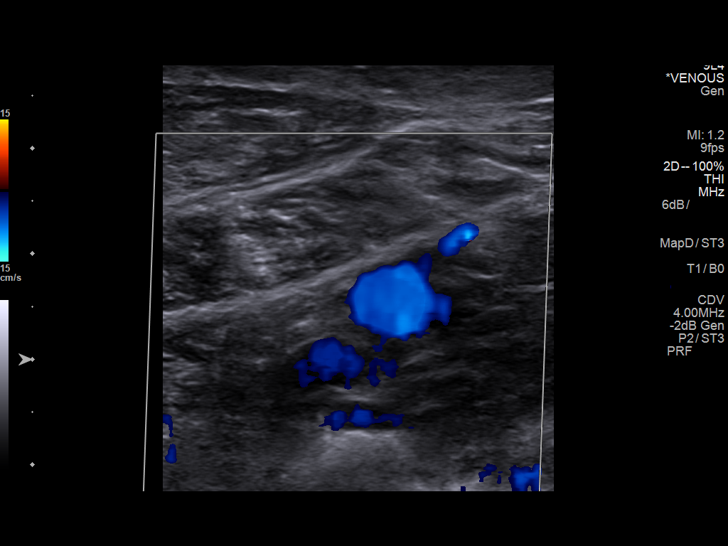
[im 26/29]
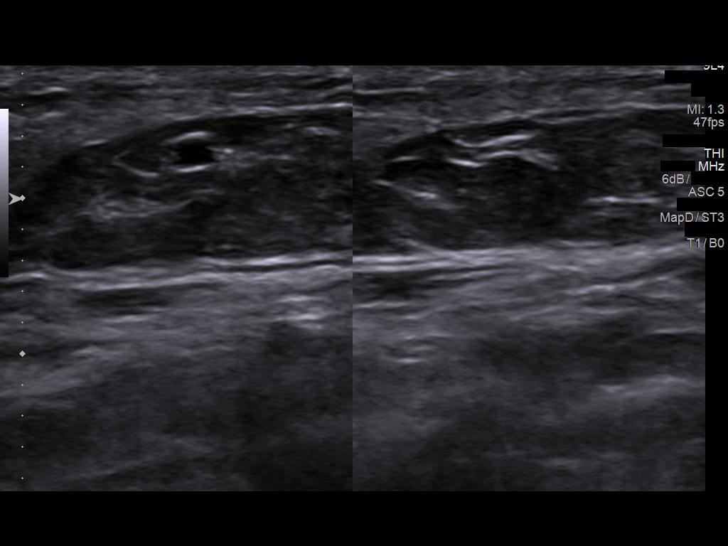
[im 29/29]
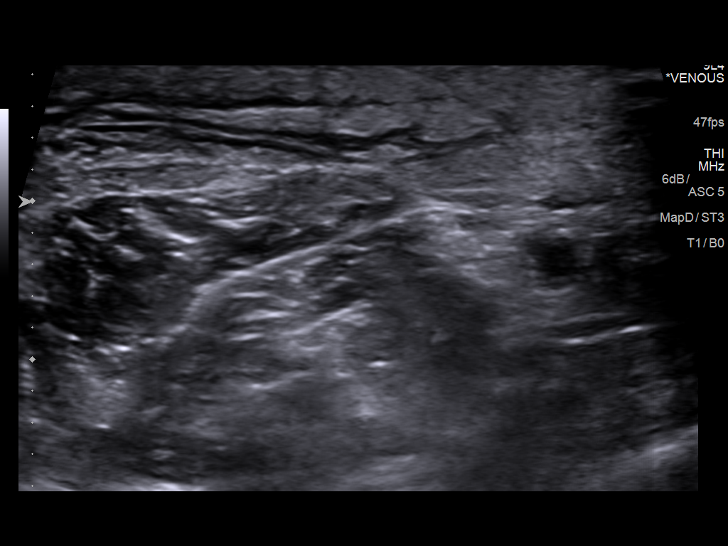

[13 of 24 positions shown; findings below may reference images not displayed]

FINDINGS: Contralateral Common Femoral Vein: Respiratory phasicity is normal
and symmetric with the symptomatic side. No evidence of thrombus.
Normal compressibility.

Common Femoral Vein: No evidence of thrombus. Normal
compressibility, respiratory phasicity and response to augmentation.

Saphenofemoral Junction: No evidence of thrombus. Normal
compressibility and flow on color Doppler imaging.

Profunda Femoral Vein: No evidence of thrombus. Normal
compressibility and flow on color Doppler imaging.

Femoral Vein: No evidence of thrombus. Normal compressibility,
respiratory phasicity and response to augmentation.

Popliteal Vein: No evidence of thrombus. Normal compressibility,
respiratory phasicity and response to augmentation.

Calf Veins: No evidence of thrombus. Normal compressibility and flow
on color Doppler imaging.

Superficial Great Saphenous Vein: No evidence of thrombus. Normal
compressibility and flow on color Doppler imaging.

Venous Reflux:  None.

Other Findings:  There is calf region edema on the left.
IMPRESSION: No evidence of left lower extremity deep venous thrombosis. Right
common femoral vein patent. There is calf region edema on the left.

## 2017-08-24 NOTE — Progress Notes (Addendum)
Subjective:   Heather Gallegos is a 72 y.o. female who presents for Medicare Annual (Subsequent) preventive examination.  Review of Systems:  No ROS.  Medicare Wellness Visit. Additional risk factors are reflected in the social history.    Sleep patterns: Sleeps 7 hours, feels rested.  Home Safety/Smoke Alarms: Feels safe in home. Smoke alarms in place.  Living environment; residence and Firearm Safety: Lives with husband Heather Gallegos) in 2 story home, rail in place.  Seat Belt Safety/Bike Helmet: Wears seat belt.   Female:   Pap-02/26/2016, followed by GYN.  Mammo-02/09/2017, BI-RADS CATEGORY  1: Negative       Dexa scan-01/06/2016, Osteopenia.        [facility]-Colonoscopy 08/22/2014, polyps. Recall 5 years. Dr. Collene Gallegos      Objective:     Vitals: LMP 03/23/1998   There is no height or weight on file to calculate BMI.  Advanced Directives 06/11/2015  Does Patient Have a Medical Advance Directive? No  Would patient like information on creating a medical advance directive? No - patient declined information    Tobacco Social History   Tobacco Use  Smoking Status Never Smoker  Smokeless Tobacco Never Used     Counseling given: Not Answered    Past Medical History:  Diagnosis Date  . Chronic sinusitis   . Clostridium difficile infection 11/2015  . Elevated cholesterol   . Environmental allergies   . Fibroid   . Leg edema, left   . Low back pain   . Uterine prolapse 2010   Past Surgical History:  Procedure Laterality Date  . TONSILLECTOMY AND ADENOIDECTOMY     Family History  Problem Relation Age of Onset  . Cancer Mother 110       Stomach cancer  . Hypertension Brother   . Diabetes Brother        Type II  . Osteoporosis Maternal Aunt    Social History   Socioeconomic History  . Marital status: Married    Spouse name: Not on file  . Number of children: Not on file  . Years of education: Not on file  . Highest education level: Not on file  Occupational History    . Not on file  Social Needs  . Financial resource strain: Not on file  . Food insecurity:    Worry: Not on file    Inability: Not on file  . Transportation needs:    Medical: Not on file    Non-medical: Not on file  Tobacco Use  . Smoking status: Never Smoker  . Smokeless tobacco: Never Used  Substance and Sexual Activity  . Alcohol use: No    Alcohol/week: 0.0 oz  . Drug use: No  . Sexual activity: Yes    Partners: Male    Birth control/protection: Post-menopausal  Lifestyle  . Physical activity:    Days per week: Not on file    Minutes per session: Not on file  . Stress: Not on file  Relationships  . Social connections:    Talks on phone: Not on file    Gets together: Not on file    Attends religious service: Not on file    Active member of club or organization: Not on file    Attends meetings of clubs or organizations: Not on file    Relationship status: Not on file  Other Topics Concern  . Not on file  Social History Narrative  . Not on file    Outpatient Encounter Medications as of 08/25/2017  Medication Sig  . b complex vitamins tablet Take 1 tablet by mouth daily.  . Biotin 1000 MCG tablet Take 1,000 mcg by mouth daily.  . Calcium Carbonate (CALCIUM 600 PO) Take by mouth daily.  . Cetirizine HCl (ZYRTEC PO) Take by mouth as needed.  . fluticasone (FLONASE) 50 MCG/ACT nasal spray   . Glucosamine 500 MG CAPS Take by mouth.  . LUTEIN PO Take 20 mg by mouth daily.  . simvastatin (ZOCOR) 40 MG tablet Take 1 tablet (40 mg total) by mouth every evening.  . vitamin C (ASCORBIC ACID) 500 MG tablet Take 500 mg by mouth daily.  . vitamin E 400 UNIT capsule Take 400 Units by mouth daily.   No facility-administered encounter medications on file as of 08/25/2017.     Activities of Daily Living In your present state of health, do you have any difficulty performing the following activities: 02/25/2017  Hearing? N  Vision? N  Difficulty concentrating or making decisions?  N  Walking or climbing stairs? N  Dressing or bathing? N  Doing errands, shopping? N  Some recent data might be hidden    Patient Care Team: Heather Minium, MD as PCP - General (Family Medicine)    Assessment:   This is a routine wellness examination for Heather Gallegos.  Exercise Activities and Dietary recommendations   Diet (meal preparation, eat out, water intake, caffeinated beverages, dairy products, fruits and vegetables): Drinks diet soda, coffee, water and coconut water.   Breakfast: toast, egg; milk/juice; coffee Lunch: sandwich. Yogurt/string cheese for snack Dinner: Lean protein and vegetables.     Goals    None      Fall Risk Fall Risk  02/25/2017 08/21/2016 08/28/2015  Falls in the past year? No No No    Depression Screen PHQ 2/9 Scores 02/25/2017 08/21/2016 08/28/2015  PHQ - 2 Score 0 0 0  PHQ- 9 Score 0 0 -     Cognitive Function        Immunization History  Administered Date(s) Administered  . Influenza,inj,Quad PF,6+ Mos 02/26/2015, 12/05/2015, 02/16/2017  . Pneumococcal Conjugate-13 08/21/2016  . Pneumococcal Polysaccharide-23 10/29/2010  . Tdap 12/23/2009  . Zoster 12/23/2009    Screening Tests Health Maintenance  Topic Date Due  . INFLUENZA VACCINE  10/21/2017  . MAMMOGRAM  02/10/2019  . TETANUS/TDAP  12/24/2019  . COLONOSCOPY  08/21/2024  . DEXA SCAN  Completed  . Hepatitis C Screening  Completed  . PNA vac Low Risk Adult  Completed       Plan:    Bring a copy of your living will and/or healthcare power of attorney to your next office visit.  Continue doing brain stimulating activities (puzzles, reading, adult coloring books, staying active) to keep memory sharp.   Continue to eat heart healthy diet (full of fruits, vegetables, whole grains, lean protein, water--limit salt, fat, and sugar intake) and increase physical activity as tolerated.   I have personally reviewed and noted the following in the patient's chart:   . Medical and  social history . Use of alcohol, tobacco or illicit drugs  . Current medications and supplements . Functional ability and status . Nutritional status . Physical activity . Advanced directives . List of other physicians . Hospitalizations, surgeries, and ER visits in previous 12 months . Vitals . Screenings to include cognitive, depression, and falls . Referrals and appointments  In addition, I have reviewed and discussed with patient certain preventive protocols, quality metrics, and best practice recommendations. A written personalized  care plan for preventive services as well as general preventive health recommendations were provided to patient.     Gerilyn Nestle, RN  08/24/2017  Reviewed documentation provided by RN and agree w/ above.  Annye Asa, MD

## 2017-08-25 ENCOUNTER — Ambulatory Visit: Payer: Medicare Other | Admitting: Family Medicine

## 2017-08-25 ENCOUNTER — Ambulatory Visit (INDEPENDENT_AMBULATORY_CARE_PROVIDER_SITE_OTHER): Payer: Medicare Other

## 2017-08-25 ENCOUNTER — Other Ambulatory Visit: Payer: Self-pay

## 2017-08-25 VITALS — BP 138/80 | HR 78 | Temp 97.9°F | Resp 16 | Ht 59.0 in | Wt 149.5 lb

## 2017-08-25 DIAGNOSIS — E785 Hyperlipidemia, unspecified: Secondary | ICD-10-CM

## 2017-08-25 DIAGNOSIS — Z0000 Encounter for general adult medical examination without abnormal findings: Secondary | ICD-10-CM

## 2017-08-25 MED ORDER — SIMVASTATIN 40 MG PO TABS
40.0000 mg | ORAL_TABLET | Freq: Every evening | ORAL | 1 refills | Status: DC
Start: 2017-08-25 — End: 2018-02-26

## 2017-08-25 NOTE — Patient Instructions (Addendum)
Bring a copy of your living will and/or healthcare power of attorney to your next office visit.  Continue doing brain stimulating activities (puzzles, reading, adult coloring books, staying active) to keep memory sharp.   Continue to eat heart healthy diet (full of fruits, vegetables, whole grains, lean protein, water--limit salt, fat, and sugar intake) and increase physical activity as tolerated.  Health Maintenance, Female Adopting a healthy lifestyle and getting preventive care can go a long way to promote health and wellness. Talk with your health care provider about what schedule of regular examinations is right for you. This is a good chance for you to check in with your provider about disease prevention and staying healthy. In between checkups, there are plenty of things you can do on your own. Experts have done a lot of research about which lifestyle changes and preventive measures are most likely to keep you healthy. Ask your health care provider for more information. Weight and diet Eat a healthy diet  Be sure to include plenty of vegetables, fruits, low-fat dairy products, and lean protein.  Do not eat a lot of foods high in solid fats, added sugars, or salt.  Get regular exercise. This is one of the most important things you can do for your health. ? Most adults should exercise for at least 150 minutes each week. The exercise should increase your heart rate and make you sweat (moderate-intensity exercise). ? Most adults should also do strengthening exercises at least twice a week. This is in addition to the moderate-intensity exercise.  Maintain a healthy weight  Body mass index (BMI) is a measurement that can be used to identify possible weight problems. It estimates body fat based on height and weight. Your health care provider can help determine your BMI and help you achieve or maintain a healthy weight.  For females 104 years of age and older: ? A BMI below 18.5 is considered  underweight. ? A BMI of 18.5 to 24.9 is normal. ? A BMI of 25 to 29.9 is considered overweight. ? A BMI of 30 and above is considered obese.  Watch levels of cholesterol and blood lipids  You should start having your blood tested for lipids and cholesterol at 72 years of age, then have this test every 5 years.  You may need to have your cholesterol levels checked more often if: ? Your lipid or cholesterol levels are high. ? You are older than 72 years of age. ? You are at high risk for heart disease.  Cancer screening Lung Cancer  Lung cancer screening is recommended for adults 78-79 years old who are at high risk for lung cancer because of a history of smoking.  A yearly low-dose CT scan of the lungs is recommended for people who: ? Currently smoke. ? Have quit within the past 15 years. ? Have at least a 30-pack-year history of smoking. A pack year is smoking an average of one pack of cigarettes a day for 1 year.  Yearly screening should continue until it has been 15 years since you quit.  Yearly screening should stop if you develop a health problem that would prevent you from having lung cancer treatment.  Breast Cancer  Practice breast self-awareness. This means understanding how your breasts normally appear and feel.  It also means doing regular breast self-exams. Let your health care provider know about any changes, no matter how small.  If you are in your 20s or 30s, you should have a clinical breast exam (  CBE) by a health care provider every 1-3 years as part of a regular health exam.  If you are 39 or older, have a CBE every year. Also consider having a breast X-ray (mammogram) every year.  If you have a family history of breast cancer, talk to your health care provider about genetic screening.  If you are at high risk for breast cancer, talk to your health care provider about having an MRI and a mammogram every year.  Breast cancer gene (BRCA) assessment is  recommended for women who have family members with BRCA-related cancers. BRCA-related cancers include: ? Breast. ? Ovarian. ? Tubal. ? Peritoneal cancers.  Results of the assessment will determine the need for genetic counseling and BRCA1 and BRCA2 testing.  Cervical Cancer Your health care provider may recommend that you be screened regularly for cancer of the pelvic organs (ovaries, uterus, and vagina). This screening involves a pelvic examination, including checking for microscopic changes to the surface of your cervix (Pap test). You may be encouraged to have this screening done every 3 years, beginning at age 57.  For women ages 7-65, health care providers may recommend pelvic exams and Pap testing every 3 years, or they may recommend the Pap and pelvic exam, combined with testing for human papilloma virus (HPV), every 5 years. Some types of HPV increase your risk of cervical cancer. Testing for HPV may also be done on women of any age with unclear Pap test results.  Other health care providers may not recommend any screening for nonpregnant women who are considered low risk for pelvic cancer and who do not have symptoms. Ask your health care provider if a screening pelvic exam is right for you.  If you have had past treatment for cervical cancer or a condition that could lead to cancer, you need Pap tests and screening for cancer for at least 20 years after your treatment. If Pap tests have been discontinued, your risk factors (such as having a new sexual partner) need to be reassessed to determine if screening should resume. Some women have medical problems that increase the chance of getting cervical cancer. In these cases, your health care provider may recommend more frequent screening and Pap tests.  Colorectal Cancer  This type of cancer can be detected and often prevented.  Routine colorectal cancer screening usually begins at 72 years of age and continues through 72 years of  age.  Your health care provider may recommend screening at an earlier age if you have risk factors for colon cancer.  Your health care provider may also recommend using home test kits to check for hidden blood in the stool.  A small camera at the end of a tube can be used to examine your colon directly (sigmoidoscopy or colonoscopy). This is done to check for the earliest forms of colorectal cancer.  Routine screening usually begins at age 35.  Direct examination of the colon should be repeated every 5-10 years through 72 years of age. However, you may need to be screened more often if early forms of precancerous polyps or small growths are found.  Skin Cancer  Check your skin from head to toe regularly.  Tell your health care provider about any new moles or changes in moles, especially if there is a change in a mole's shape or color.  Also tell your health care provider if you have a mole that is larger than the size of a pencil eraser.  Always use sunscreen. Apply sunscreen liberally  and repeatedly throughout the day.  Protect yourself by wearing long sleeves, pants, a wide-brimmed hat, and sunglasses whenever you are outside.  Heart disease, diabetes, and high blood pressure  High blood pressure causes heart disease and increases the risk of stroke. High blood pressure is more likely to develop in: ? People who have blood pressure in the high end of the normal range (130-139/85-89 mm Hg). ? People who are overweight or obese. ? People who are African American.  If you are 49-20 years of age, have your blood pressure checked every 3-5 years. If you are 92 years of age or older, have your blood pressure checked every year. You should have your blood pressure measured twice-once when you are at a hospital or clinic, and once when you are not at a hospital or clinic. Record the average of the two measurements. To check your blood pressure when you are not at a hospital or clinic, you  can use: ? An automated blood pressure machine at a pharmacy. ? A home blood pressure monitor.  If you are between 57 years and 75 years old, ask your health care provider if you should take aspirin to prevent strokes.  Have regular diabetes screenings. This involves taking a blood sample to check your fasting blood sugar level. ? If you are at a normal weight and have a low risk for diabetes, have this test once every three years after 72 years of age. ? If you are overweight and have a high risk for diabetes, consider being tested at a younger age or more often. Preventing infection Hepatitis B  If you have a higher risk for hepatitis B, you should be screened for this virus. You are considered at high risk for hepatitis B if: ? You were born in a country where hepatitis B is common. Ask your health care provider which countries are considered high risk. ? Your parents were born in a high-risk country, and you have not been immunized against hepatitis B (hepatitis B vaccine). ? You have HIV or AIDS. ? You use needles to inject street drugs. ? You live with someone who has hepatitis B. ? You have had sex with someone who has hepatitis B. ? You get hemodialysis treatment. ? You take certain medicines for conditions, including cancer, organ transplantation, and autoimmune conditions.  Hepatitis C  Blood testing is recommended for: ? Everyone born from [birth year] through 1965. ? Anyone with known risk factors for hepatitis C.  Sexually transmitted infections (STIs)  You should be screened for sexually transmitted infections (STIs) including gonorrhea and chlamydia if: ? You are sexually active and are younger than 72 years of age. ? You are older than 72 years of age and your health care provider tells you that you are at risk for this type of infection. ? Your sexual activity has changed since you were last screened and you are at an increased risk for chlamydia or gonorrhea. Ask your  health care provider if you are at risk.  If you do not have HIV, but are at risk, it may be recommended that you take a prescription medicine daily to prevent HIV infection. This is called pre-exposure prophylaxis (PrEP). You are considered at risk if: ? You are sexually active and do not regularly use condoms or know the HIV status of your partner(s). ? You take drugs by injection. ? You are sexually active with a partner who has HIV.  Talk with your health care provider about whether  you are at high risk of being infected with HIV. If you choose to begin PrEP, you should first be tested for HIV. You should then be tested every 3 months for as long as you are taking PrEP. Pregnancy  If you are premenopausal and you may become pregnant, ask your health care provider about preconception counseling.  If you may become pregnant, take 400 to 800 micrograms (mcg) of folic acid every day.  If you want to prevent pregnancy, talk to your health care provider about birth control (contraception). Osteoporosis and menopause  Osteoporosis is a disease in which the bones lose minerals and strength with aging. This can result in serious bone fractures. Your risk for osteoporosis can be identified using a bone density scan.  If you are 32 years of age or older, or if you are at risk for osteoporosis and fractures, ask your health care provider if you should be screened.  Ask your health care provider whether you should take a calcium or vitamin D supplement to lower your risk for osteoporosis.  Menopause may have certain physical symptoms and risks.  Hormone replacement therapy may reduce some of these symptoms and risks. Talk to your health care provider about whether hormone replacement therapy is right for you. Follow these instructions at home:  Schedule regular health, dental, and eye exams.  Stay current with your immunizations.  Do not use any tobacco products including cigarettes, chewing  tobacco, or electronic cigarettes.  If you are pregnant, do not drink alcohol.  If you are breastfeeding, limit how much and how often you drink alcohol.  Limit alcohol intake to no more than 1 drink per day for nonpregnant women. One drink equals 12 ounces of beer, 5 ounces of wine, or 1 ounces of hard liquor.  Do not use street drugs.  Do not share needles.  Ask your health care provider for help if you need support or information about quitting drugs.  Tell your health care provider if you often feel depressed.  Tell your health care provider if you have ever been abused or do not feel safe at home. This information is not intended to replace advice given to you by your health care provider. Make sure you discuss any questions you have with your health care provider. Document Released: 09/22/2010 Document Revised: 08/15/2015 Document Reviewed: 12/11/2014 Elsevier Interactive Patient Education  [employer].

## 2017-08-27 ENCOUNTER — Other Ambulatory Visit: Payer: Self-pay

## 2017-08-27 ENCOUNTER — Ambulatory Visit (INDEPENDENT_AMBULATORY_CARE_PROVIDER_SITE_OTHER): Payer: Medicare Other | Admitting: Family Medicine

## 2017-08-27 ENCOUNTER — Encounter: Payer: Self-pay | Admitting: Family Medicine

## 2017-08-27 ENCOUNTER — Other Ambulatory Visit (INDEPENDENT_AMBULATORY_CARE_PROVIDER_SITE_OTHER): Payer: Medicare Other

## 2017-08-27 VITALS — BP 120/80 | HR 79 | Temp 98.0°F | Resp 16 | Ht 59.0 in | Wt 151.2 lb

## 2017-08-27 DIAGNOSIS — E785 Hyperlipidemia, unspecified: Secondary | ICD-10-CM | POA: Diagnosis not present

## 2017-08-27 DIAGNOSIS — R7309 Other abnormal glucose: Secondary | ICD-10-CM

## 2017-08-27 LAB — HEPATIC FUNCTION PANEL
ALBUMIN: 4.2 g/dL (ref 3.5–5.2)
ALT: 20 U/L (ref 0–35)
AST: 21 U/L (ref 0–37)
Alkaline Phosphatase: 53 U/L (ref 39–117)
Bilirubin, Direct: 0.1 mg/dL (ref 0.0–0.3)
Total Bilirubin: 0.3 mg/dL (ref 0.2–1.2)
Total Protein: 6.6 g/dL (ref 6.0–8.3)

## 2017-08-27 LAB — CBC WITH DIFFERENTIAL/PLATELET
BASOS ABS: 0 10*3/uL (ref 0.0–0.1)
BASOS PCT: 0.9 % (ref 0.0–3.0)
EOS PCT: 5.6 % — AB (ref 0.0–5.0)
Eosinophils Absolute: 0.3 10*3/uL (ref 0.0–0.7)
HEMATOCRIT: 39.4 % (ref 36.0–46.0)
Hemoglobin: 13.7 g/dL (ref 12.0–15.0)
LYMPHS PCT: 32.7 % (ref 12.0–46.0)
Lymphs Abs: 1.7 10*3/uL (ref 0.7–4.0)
MCHC: 34.7 g/dL (ref 30.0–36.0)
MCV: 88.7 fl (ref 78.0–100.0)
MONOS PCT: 9.9 % (ref 3.0–12.0)
Monocytes Absolute: 0.5 10*3/uL (ref 0.1–1.0)
NEUTROS ABS: 2.6 10*3/uL (ref 1.4–7.7)
Neutrophils Relative %: 50.9 % (ref 43.0–77.0)
PLATELETS: 292 10*3/uL (ref 150.0–400.0)
RBC: 4.45 Mil/uL (ref 3.87–5.11)
RDW: 13 % (ref 11.5–15.5)
WBC: 5.1 10*3/uL (ref 4.0–10.5)

## 2017-08-27 LAB — BASIC METABOLIC PANEL
BUN: 23 mg/dL (ref 6–23)
CALCIUM: 9.9 mg/dL (ref 8.4–10.5)
CO2: 26 meq/L (ref 19–32)
Chloride: 105 mEq/L (ref 96–112)
Creatinine, Ser: 1.01 mg/dL (ref 0.40–1.20)
GFR: 57.25 mL/min — ABNORMAL LOW (ref >60.00)
GLUCOSE: 131 mg/dL — AB (ref 70–99)
Potassium: 4.8 mEq/L (ref 3.5–5.1)
SODIUM: 140 meq/L (ref 135–145)

## 2017-08-27 LAB — LIPID PANEL
CHOL/HDL RATIO: 4
Cholesterol: 133 mg/dL (ref 0–200)
HDL: 35.3 mg/dL — AB (ref >39.00)
LDL CALC: 67 mg/dL (ref 0–99)
NONHDL: 98.07
Triglycerides: 155 mg/dL — ABNORMAL HIGH (ref 0.0–149.0)
VLDL: 31 mg/dL (ref 0.0–40.0)

## 2017-08-27 LAB — TSH: TSH: 4.05 u[IU]/mL (ref 0.35–4.50)

## 2017-08-27 LAB — HEMOGLOBIN A1C: Hgb A1c MFr Bld: 6.5 % (ref 4.6–6.5)

## 2017-08-27 NOTE — Patient Instructions (Signed)
Follow up in 6 months to recheck cholesterol We'll notify you of your lab results and make any changes if needed Keep up the good work on healthy diet and regular exercise- you're doing great! Call with any questions or concerns Have a great summer!!  

## 2017-08-27 NOTE — Assessment & Plan Note (Signed)
Chronic problem.  Tolerating statin w/o difficulty.  Stressed need for healthy diet and regular exercise.  Check labs.  Adjust meds prn  

## 2017-08-27 NOTE — Progress Notes (Signed)
   Subjective:    Patient ID: Heather Gallegos, female    DOB: [birth year], 72 y.o.   MRN: [id number]  HPI Hyperlipidemia- chornic problem, on Simvastatin 40mg  daily.  Limited exercise.  Attempting to eat better.  No CP, SOB, HAs, visual changes, abd pain, N/V, myalgias, edema.   Review of Systems For ROS see HPI     Objective:   Physical Exam  Constitutional: She is oriented to person, place, and time. She appears well-developed and well-nourished. No distress.  HENT:  Head: Normocephalic and atraumatic.  Eyes: Pupils are equal, round, and reactive to light. Conjunctivae and EOM are normal.  Neck: Normal range of motion. Neck supple. No thyromegaly present.  Cardiovascular: Normal rate, regular rhythm, normal heart sounds and intact distal pulses.  No murmur heard. Pulmonary/Chest: Effort normal and breath sounds normal. No respiratory distress.  Abdominal: Soft. She exhibits no distension. There is no tenderness.  Musculoskeletal: She exhibits no edema.  Lymphadenopathy:    She has no cervical adenopathy.  Neurological: She is alert and oriented to person, place, and time.  Skin: Skin is warm and dry.  Psychiatric: She has a normal mood and affect. Her behavior is normal.  Vitals reviewed.         Assessment & Plan:

## 2017-10-05 ENCOUNTER — Other Ambulatory Visit: Payer: Self-pay

## 2017-10-05 ENCOUNTER — Encounter: Payer: Self-pay | Admitting: Family Medicine

## 2017-10-05 ENCOUNTER — Ambulatory Visit (INDEPENDENT_AMBULATORY_CARE_PROVIDER_SITE_OTHER): Payer: Medicare Other | Admitting: Family Medicine

## 2017-10-05 VITALS — BP 138/80 | HR 102 | Temp 99.2°F | Resp 16 | Ht 59.0 in | Wt 150.0 lb

## 2017-10-05 DIAGNOSIS — B9689 Other specified bacterial agents as the cause of diseases classified elsewhere: Secondary | ICD-10-CM | POA: Diagnosis not present

## 2017-10-05 DIAGNOSIS — J329 Chronic sinusitis, unspecified: Secondary | ICD-10-CM | POA: Diagnosis not present

## 2017-10-05 MED ORDER — AMOXICILLIN 875 MG PO TABS: 875.0000 mg | ORAL_TABLET | Freq: Two times a day (BID) | ORAL | 0 refills | Status: DC

## 2017-10-05 NOTE — Patient Instructions (Signed)
Follow up as needed or as scheduled START the Amoxicillin twice daily- take w/ food Drink plenty of fluids REST! Mucinex to thin your drainage and congestion Call with any questions or concerns Hang in there!!!

## 2017-10-05 NOTE — Progress Notes (Signed)
   Subjective:    Patient ID: Heather Gallegos, female    DOB: 1945-06-20, 72 y.o.   MRN: [id number]  HPI URI- sxs started w/ ear pain, sore throat, then nasal congestion and sinus pressure.  + low grade temps.  sxs started ~1 week ago.  + frontal and maxillary facial pain.  + HA.  No tooth pain.  Cough is intermittently productive.  + sick contacts.   Review of Systems For ROS see HPI     Objective:   Physical Exam  Constitutional: She is oriented to person, place, and time. She appears well-developed and well-nourished. No distress.  HENT:  Head: Normocephalic and atraumatic.  Right Ear: Tympanic membrane normal.  Left Ear: Tympanic membrane normal.  Nose: Mucosal edema and rhinorrhea present. Right sinus exhibits maxillary sinus tenderness and frontal sinus tenderness. Left sinus exhibits maxillary sinus tenderness and frontal sinus tenderness.  Mouth/Throat: Uvula is midline and mucous membranes are normal. Posterior oropharyngeal erythema present. No oropharyngeal exudate.  Eyes: Pupils are equal, round, and reactive to light. Conjunctivae and EOM are normal.  Neck: Normal range of motion. Neck supple.  Cardiovascular: Normal rate, regular rhythm and normal heart sounds.  Pulmonary/Chest: Effort normal and breath sounds normal. No respiratory distress. She has no wheezes.  Lymphadenopathy:    She has no cervical adenopathy.  Neurological: She is alert and oriented to person, place, and time.  Skin: Skin is warm and dry.  Vitals reviewed.         Assessment & Plan:  Sinusitis- new.  Pt's sxs and PE consistent w/ infxn.  Start abx.  Reviewed supportive care and red flags that should prompt return.  Pt expressed understanding and is in agreement w/ plan.

## 2017-10-25 ENCOUNTER — Telehealth: Payer: Self-pay | Admitting: Obstetrics and Gynecology

## 2017-10-25 NOTE — Telephone Encounter (Signed)
Spoke with patient. Patient reports yellow vaginal d/c and burning, symptoms started 11/20/17. Burns when urine touches skin. Denies urinary symptoms, bleeding, fever/chills, odor. Completed abx for sinus infection 1.5wks ago. Patient is leaving for vacation on 8/7, requesting OV.   OV scheduled for 8/6 at 10:15am with Dr. Talbert Nan.   Routing to provider for final review. Patient is agreeable to disposition. Will close encounter.

## 2017-10-25 NOTE — Telephone Encounter (Signed)
Patient has a vaginal infection and would like to see Dr Talbert Nan today.

## 2017-10-25 NOTE — Progress Notes (Signed)
GYNECOLOGY  VISIT   HPI: 72 y.o.   Married  Caucasian  female   G2P2 with Patient's last menstrual period was 03/23/1998.   here for vaginal discharge and burning that started last Wednesday. Symptoms have improved some. Discharge is dark yellow, thick. Some itching and burning. No urinary c/o.  Reports fever and chills on Saturday. Denies fever or chills since Saturday. No diarrhea, no cold symptoms. She was treated for a sinus infection 2 weeks ago with amoxicillin.  She has a known uterine prolapse, tolerable.   GYNECOLOGIC HISTORY: Patient's last menstrual period was 03/23/1998. Contraception: Post menopausal Menopausal hormone therapy: None        OB History    Gravida  2   Para  2   Term  2   Preterm  0   AB  0   Living  2     SAB      TAB      Ectopic      Multiple      Live Births  2              Patient Active Problem List   Diagnosis Date Noted  . Physical exam 08/21/2016  . Change of skin color 09/05/2015  . Localized swelling of lower leg 08/28/2015  . Hyperlipidemia   . Uterine prolapse     Past Medical History:  Diagnosis Date  . Chronic sinusitis   . Clostridium difficile infection 11/2015  . Elevated cholesterol   . Environmental allergies   . Fibroid   . Leg edema, left   . Low back pain   . Uterine prolapse 2010    Past Surgical History:  Procedure Laterality Date  . TONSILLECTOMY AND ADENOIDECTOMY      Current Outpatient Medications  Medication Sig Dispense Refill  . b complex vitamins tablet Take 1 tablet by mouth daily.    . Biotin 1000 MCG tablet Take 1,000 mcg by mouth daily.    . Calcium Carbonate (CALCIUM 600 PO) Take by mouth daily.    . Cetirizine HCl (ZYRTEC PO) Take by mouth as needed.    . Cholecalciferol (VITAMIN D3) 1000 units CAPS     . Coenzyme Q10 (COQ10) 100 MG CAPS     . fluticasone (FLONASE) 50 MCG/ACT nasal spray daily as needed.     . Glucosamine 500 MG CAPS Take by mouth.    Marland Kitchen ibuprofen  (ADVIL,MOTRIN) 200 MG tablet Take by mouth.    . Lactobacillus-Inulin (PROBIOTIC DIGESTIVE SUPPORT PO) Take by mouth.    . LUTEIN PO Take 20 mg by mouth daily.    . simvastatin (ZOCOR) 40 MG tablet Take 1 tablet (40 mg total) by mouth every evening. 90 tablet 1  . vitamin C (ASCORBIC ACID) 500 MG tablet Take 500 mg by mouth daily.    . vitamin E 400 UNIT capsule Take 400 Units by mouth daily.     No current facility-administered medications for this visit.      ALLERGIES: Patient has no known allergies.  Family History  Problem Relation Age of Onset  . Cancer Mother 23       Stomach cancer  . Hypertension Brother   . Diabetes Brother        Type II  . Osteoporosis Maternal Aunt     Social History   Socioeconomic History  . Marital status: Married    Spouse name: Not on file  . Number of children: Not on file  . Years of  education: Not on file  . Highest education level: Not on file  Occupational History  . Not on file  Social Needs  . Financial resource strain: Not on file  . Food insecurity:    Worry: Not on file    Inability: Not on file  . Transportation needs:    Medical: Not on file    Non-medical: Not on file  Tobacco Use  . Smoking status: Never Smoker  . Smokeless tobacco: Never Used  Substance and Sexual Activity  . Alcohol use: No    Alcohol/week: 0.0 oz  . Drug use: No  . Sexual activity: Yes    Partners: Male    Birth control/protection: Post-menopausal  Lifestyle  . Physical activity:    Days per week: Not on file    Minutes per session: Not on file  . Stress: Not on file  Relationships  . Social connections:    Talks on phone: Not on file    Gets together: Not on file    Attends religious service: Not on file    Active member of club or organization: Not on file    Attends meetings of clubs or organizations: Not on file    Relationship status: Not on file  . Intimate partner violence:    Fear of current or ex partner: Not on file     Emotionally abused: Not on file    Physically abused: Not on file    Forced sexual activity: Not on file  Other Topics Concern  . Not on file  Social History Narrative  . Not on file    Review of Systems  Constitutional: Positive for chills and fever.  HENT: Negative.   Eyes: Negative.   Respiratory: Negative.   Cardiovascular: Negative.   Gastrointestinal: Negative.   Genitourinary:       Vaginal discharge Vaginal itching  Musculoskeletal: Negative.   Skin: Negative.   Neurological: Negative.   Endo/Heme/Allergies: Negative.   Psychiatric/Behavioral: Negative.     PHYSICAL EXAMINATION:    BP 126/80 (BP Location: Right Arm, Patient Position: Sitting)   Pulse 66   Wt 149 lb 3.2 oz (67.7 kg)   LMP 03/23/1998   BMI 30.13 kg/m     General appearance: alert, cooperative and appears stated age  Pelvic: External genitalia:  no lesions              Urethra:  normal appearing urethra with no masses, tenderness or lesions              Bartholins and Skenes: normal                 Vagina: mildly atrophic appearing vagina with normal color and discharge, no lesions. Grade 2 uterine prolapse              Cervix: no lesions              Bimanual Exam:  Uterus:  normal size, contour, position, consistency, mobility, non-tender              Adnexa: no mass, fullness, tenderness               Chaperone was present for exam.  ASSESSMENT Vulvovaginitis    PLAN Treat BV Steroid ointment for pruritus Affirm sent Will call in diflucan (she is flying to [place] [employer], will only take it if needed).    An After Visit Summary was printed and given to the patient.  ~15 minutes face to face time  of which over 50% was spent in counseling.

## 2017-10-26 ENCOUNTER — Encounter: Payer: Self-pay | Admitting: Obstetrics and Gynecology

## 2017-10-26 ENCOUNTER — Ambulatory Visit (INDEPENDENT_AMBULATORY_CARE_PROVIDER_SITE_OTHER): Payer: Medicare Other | Admitting: Obstetrics and Gynecology

## 2017-10-26 ENCOUNTER — Other Ambulatory Visit: Payer: Self-pay

## 2017-10-26 VITALS — BP 126/80 | HR 66 | Wt 149.2 lb

## 2017-10-26 DIAGNOSIS — N760 Acute vaginitis: Secondary | ICD-10-CM | POA: Diagnosis not present

## 2017-10-26 MED ORDER — METRONIDAZOLE 500 MG PO TABS
500.0000 mg | ORAL_TABLET | Freq: Two times a day (BID) | ORAL | 0 refills | Status: DC
Start: 2017-10-26 — End: 2017-12-15

## 2017-10-26 MED ORDER — FLUCONAZOLE 150 MG PO TABS: 150.0000 mg | ORAL_TABLET | Freq: Once | ORAL | 0 refills | Status: AC

## 2017-10-26 MED ORDER — BETAMETHASONE VALERATE 0.1 % EX OINT
TOPICAL_OINTMENT | CUTANEOUS | 0 refills | Status: DC
Start: 2017-10-26 — End: 2017-12-15

## 2017-10-26 NOTE — Patient Instructions (Signed)
Bacterial Vaginosis Bacterial vaginosis is a vaginal infection that occurs when the normal balance of bacteria in the vagina is disrupted. It results from an overgrowth of certain bacteria. This is the most common vaginal infection among women ages 15-44. Because bacterial vaginosis increases your risk for STIs (sexually transmitted infections), getting treated can help reduce your risk for chlamydia, gonorrhea, herpes, and HIV (human immunodeficiency virus). Treatment is also important for preventing complications in pregnant women, because this condition can cause an early (premature) delivery. What are the causes? This condition is caused by an increase in harmful bacteria that are normally present in small amounts in the vagina. However, the reason that the condition develops is not fully understood. What increases the risk? The following factors may make you more likely to develop this condition:  Having a new sexual partner or multiple sexual partners.  Having unprotected sex.  Douching.  Having an intrauterine device (IUD).  Smoking.  Drug and alcohol abuse.  Taking certain antibiotic medicines.  Being pregnant.  You cannot get bacterial vaginosis from toilet seats, bedding, swimming pools, or contact with objects around you. What are the signs or symptoms? Symptoms of this condition include:  Grey or white vaginal discharge. The discharge can also be watery or foamy.  A fish-like odor with discharge, especially after sexual intercourse or during menstruation.  Itching in and around the vagina.  Burning or pain with urination.  Some women with bacterial vaginosis have no signs or symptoms. How is this diagnosed? This condition is diagnosed based on:  Your medical history.  A physical exam of the vagina.  Testing a sample of vaginal fluid under a microscope to look for a large amount of bad bacteria or abnormal cells. Your health care provider may use a cotton swab  or a small wooden spatula to collect the sample.  How is this treated? This condition is treated with antibiotics. These may be given as a pill, a vaginal cream, or a medicine that is put into the vagina (suppository). If the condition comes back after treatment, a second round of antibiotics may be needed. Follow these instructions at home: Medicines  Take over-the-counter and prescription medicines only as told by your health care provider.  Take or use your antibiotic as told by your health care provider. Do not stop taking or using the antibiotic even if you start to feel better. General instructions  If you have a female sexual partner, tell her that you have a vaginal infection. She should see her health care provider and be treated if she has symptoms. If you have a female sexual partner, he does not need treatment.  During treatment: ? Avoid sexual activity until you finish treatment. ? Do not douche. ? Avoid alcohol as directed by your health care provider. ? Avoid breastfeeding as directed by your health care provider.  Drink enough water and fluids to keep your urine clear or pale yellow.  Keep the area around your vagina and rectum clean. ? Wash the area daily with warm water. ? Wipe yourself from front to back after using the toilet.  Keep all follow-up visits as told by your health care provider. This is important. How is this prevented?  Do not douche.  Wash the outside of your vagina with warm water only.  Use protection when having sex. This includes latex condoms and dental dams.  Limit how many sexual partners you have. To help prevent bacterial vaginosis, it is best to have sex with just   one partner (monogamous).  Make sure you and your sexual partner are tested for STIs.  Wear cotton or cotton-lined underwear.  Avoid wearing tight pants and pantyhose, especially during summer.  Limit the amount of alcohol that you drink.  Do not use any products that  contain nicotine or tobacco, such as cigarettes and e-cigarettes. If you need help quitting, ask your health care provider.  Do not use illegal drugs. Where to find more information:  Centers for Disease Control and Prevention: www.cdc.gov/std  American Sexual Health Association (ASHA): www.ashastd.org  U.S. Department of Health and Human Services, Office on Women's Health: www.womenshealth.gov/ or https://www.womenshealth.gov/a-z-topics/bacterial-vaginosis Contact a health care provider if:  Your symptoms do not improve, even after treatment.  You have more discharge or pain when urinating.  You have a fever.  You have pain in your abdomen.  You have pain during sex.  You have vaginal bleeding between periods. Summary  Bacterial vaginosis is a vaginal infection that occurs when the normal balance of bacteria in the vagina is disrupted.  Because bacterial vaginosis increases your risk for STIs (sexually transmitted infections), getting treated can help reduce your risk for chlamydia, gonorrhea, herpes, and HIV (human immunodeficiency virus). Treatment is also important for preventing complications in pregnant women, because the condition can cause an early (premature) delivery.  This condition is treated with antibiotic medicines. These may be given as a pill, a vaginal cream, or a medicine that is put into the vagina (suppository). This information is not intended to replace advice given to you by your health care provider. Make sure you discuss any questions you have with your health care provider. Document Released: 03/09/2005 Document Revised: 07/13/2016 Document Reviewed: 11/23/2015 Elsevier Interactive Patient Education  2018 Elsevier Inc.  

## 2017-10-27 LAB — VAGINITIS/VAGINOSIS, DNA PROBE
CANDIDA SPECIES: NEGATIVE
GARDNERELLA VAGINALIS: POSITIVE — AB
Trichomonas vaginosis: NEGATIVE

## 2017-11-25 DIAGNOSIS — Z23 Encounter for immunization: Secondary | ICD-10-CM | POA: Diagnosis not present

## 2017-12-13 NOTE — Progress Notes (Signed)
72 y.o. G90P2002 Married White or Caucasian Not Hispanic or Latino female here for annual exam.   Period Cycle (Days): (Postmenopausal)  She was running late getting her and was stressed, her BP is high, isn't normally. Feeling fine. Known prolapse is tolerable, not any worse. Pessary didn't work, not currently interested in surgery. Only notices it when she wipes.  She has tolerable mixed urinary incontinence, occurs a few times a week. Small amounts.  Sexually active, no pain.   Patient's last menstrual period was 03/23/1998.          Sexually active: Yes.    The current method of family planning is post menopausal status.    Exercising: No.  The patient does not participate in regular exercise at present. Smoker:  no  Health Maintenance: Pap:  10/24/2014 normal, negative HPV History of abnormal Pap:  Yes, colpo, negative biopsy MMG:  02/09/2017 BI-RADS CATEGORY  1: Negative. BMD:   01/06/2016 osteopenic, T score -1.1, FRAX 8.8/1 Colonoscopy: 08/22/2014, is due? She will check with GI.   TDaP:  2011 Gardasil: n/a   reports that she has never smoked. She has never used smokeless tobacco. She reports that she does not drink alcohol or use drugs. Still working in [employer], thinking about retirement. 2 grand kids are here, 3 in [place].  Past Medical History:  Diagnosis Date  . Chronic sinusitis   . Clostridium difficile infection 11/2015  . Elevated cholesterol   . Environmental allergies   . Fibroid   . Leg edema, left   . Low back pain   . Uterine prolapse 2010    Past Surgical History:  Procedure Laterality Date  . TONSILLECTOMY AND ADENOIDECTOMY      Current Outpatient Medications  Medication Sig Dispense Refill  . b complex vitamins tablet Take 1 tablet by mouth daily.    . Biotin 1000 MCG tablet Take 1,000 mcg by mouth daily.    . Calcium Carbonate (CALCIUM 600 PO) Take by mouth daily.    . Cetirizine HCl (ZYRTEC PO) Take by mouth as needed.    . Cholecalciferol  (VITAMIN D3) 1000 units CAPS     . Coenzyme Q10 (COQ10) 100 MG CAPS     . fluticasone (FLONASE) 50 MCG/ACT nasal spray daily as needed.     . Glucosamine 500 MG CAPS Take by mouth.    Marland Kitchen ibuprofen (ADVIL,MOTRIN) 200 MG tablet Take by mouth.    . Lactobacillus-Inulin (PROBIOTIC DIGESTIVE SUPPORT PO) Take by mouth.    . LUTEIN PO Take 20 mg by mouth daily.    . simvastatin (ZOCOR) 40 MG tablet Take 1 tablet (40 mg total) by mouth every evening. 90 tablet 1  . vitamin C (ASCORBIC ACID) 500 MG tablet Take 500 mg by mouth daily.    . vitamin E 400 UNIT capsule Take 400 Units by mouth daily.     No current facility-administered medications for this visit.     Family History  Problem Relation Age of Onset  . Cancer Mother 3       Stomach cancer  . Hypertension Brother   . Diabetes Brother        Type II  . Osteoporosis Maternal Aunt     Review of Systems  Constitutional: Negative.   HENT: Negative.   Eyes: Negative.   Respiratory: Negative.   Cardiovascular: Negative.   Gastrointestinal: Negative.   Endocrine: Negative.   Genitourinary:       Loss of urine with sneeze or cough  Musculoskeletal: Negative.   Skin: Negative.   Allergic/Immunologic: Negative.   Neurological: Negative.   Hematological: Negative.   Psychiatric/Behavioral: Negative.   All other systems reviewed and are negative.   Exam:   BP (!) 180/100 (BP Location: Left Arm, Patient Position: Sitting, Cuff Size: Normal)   Pulse 80   Ht 4\' 11"  (1.499 m)   Wt 151 lb (68.5 kg)   LMP 03/23/1998   BMI 30.50 kg/m   Weight change: @WEIGHTCHANGE @ Height:   Height: 4\' 11"  (149.9 cm)  Ht Readings from Last 3 Encounters:  12/15/17 4\' 11"  (1.499 m)  10/05/17 4\' 11"  (1.499 m)  08/27/17 4\' 11"  (1.499 m)    General appearance: alert, cooperative and appears stated age Head: Normocephalic, without obvious abnormality, atraumatic Neck: no adenopathy, supple, symmetrical, trachea midline and thyroid normal to inspection  and palpation Lungs: clear to auscultation bilaterally Cardiovascular: regular rate and rhythm Breasts: normal appearance, no masses or tenderness Abdomen: soft, non-tender; non distended,  no masses,  no organomegaly Extremities: extremities normal, atraumatic, no cyanosis or edema Skin: Skin color, texture, turgor normal. No rashes or lesions Lymph nodes: Cervical, supraclavicular, and axillary nodes normal. No abnormal inguinal nodes palpated Neurologic: Grossly normal   Pelvic: External genitalia:  no lesions              Urethra:  normal appearing urethra with no masses, tenderness or lesions              Bartholins and Skenes: normal                 Vagina: normal appearing vagina with normal color and discharge, no lesions              Cervix: no lesions  Grade 2 uterine prolapse, cervix is at the introitus, no change               Bimanual Exam:  Uterus:  normal size, contour, position, consistency, mobility, non-tender              Adnexa: no mass, fullness, tenderness               Rectovaginal: Confirms               Anus:  normal sphincter tone, no lesions  Chaperone was present for exam.  A:  Well Woman with normal exam  Very mild osteopenia  Uterine prolapse, tolerable  Elevated BP, repeat 156/80  P:   Labs with primary MD  Mammogram in 11/19  Colonoscopy due  Discussed breast self exam  Discussed calcium and vit D intake  Doesn't need a dexa for ~3 more years  Pap with HPV (patient desires)  F/U with her primary for BP check

## 2017-12-15 ENCOUNTER — Encounter: Payer: Self-pay | Admitting: Obstetrics and Gynecology

## 2017-12-15 ENCOUNTER — Other Ambulatory Visit: Payer: Self-pay

## 2017-12-15 ENCOUNTER — Ambulatory Visit (INDEPENDENT_AMBULATORY_CARE_PROVIDER_SITE_OTHER): Payer: Medicare Other | Admitting: Obstetrics and Gynecology

## 2017-12-15 ENCOUNTER — Other Ambulatory Visit (HOSPITAL_COMMUNITY)
Admission: RE | Admit: 2017-12-15 | Discharge: 2017-12-15 | Disposition: A | Discharge status: Home / Self Care | Payer: Medicare Other | Source: Ambulatory Visit | Attending: Obstetrics and Gynecology | Admitting: Obstetrics and Gynecology

## 2017-12-15 VITALS — BP 156/80 | HR 80 | Ht 59.0 in | Wt 151.0 lb

## 2017-12-15 DIAGNOSIS — N814 Uterovaginal prolapse, unspecified: Secondary | ICD-10-CM

## 2017-12-15 DIAGNOSIS — M8580 Other specified disorders of bone density and structure, unspecified site: Secondary | ICD-10-CM

## 2017-12-15 DIAGNOSIS — Z124 Encounter for screening for malignant neoplasm of cervix: Secondary | ICD-10-CM

## 2017-12-15 DIAGNOSIS — Z01419 Encounter for gynecological examination (general) (routine) without abnormal findings: Secondary | ICD-10-CM | POA: Diagnosis not present

## 2017-12-15 DIAGNOSIS — R030 Elevated blood-pressure reading, without diagnosis of hypertension: Secondary | ICD-10-CM | POA: Diagnosis not present

## 2017-12-15 NOTE — Patient Instructions (Signed)
EXERCISE AND DIET:  We recommended that you start or continue a regular exercise program for good health. Regular exercise means any activity that makes your heart beat faster and makes you sweat.  We recommend exercising at least 30 minutes per day at least 3 days a week, preferably 4 or 5.  We also recommend a diet low in fat and sugar.  Inactivity, poor dietary choices and obesity can cause diabetes, heart attack, stroke, and kidney damage, among others.    ALCOHOL AND SMOKING:  Women should limit their alcohol intake to no more than 7 drinks/beers/glasses of wine (combined, not each!) per week. Moderation of alcohol intake to this level decreases your risk of breast cancer and liver damage. And of course, no recreational drugs are part of a healthy lifestyle.  And absolutely no smoking or even second hand smoke. Most people know smoking can cause heart and lung diseases, but did you know it also contributes to weakening of your bones? Aging of your skin?  Yellowing of your teeth and nails?  CALCIUM AND VITAMIN D:  Adequate intake of calcium and Vitamin D are recommended.  The recommendations for exact amounts of these supplements seem to change often, but generally speaking 600 mg of calcium (either carbonate or citrate) and 800 units of Vitamin D per day seems prudent. Certain women may benefit from higher intake of Vitamin D.  If you are among these women, your doctor will have told you during your visit.    PAP SMEARS:  Pap smears, to check for cervical cancer or precancers,  have traditionally been done yearly, although recent scientific advances have shown that most women can have pap smears less often.  However, every woman still should have a physical exam from her gynecologist every year. It will include a breast check, inspection of the vulva and vagina to check for abnormal growths or skin changes, a visual exam of the cervix, and then an exam to evaluate the size and shape of the uterus and  ovaries.  And after 72 years of age, a rectal exam is indicated to check for rectal cancers. We will also provide age appropriate advice regarding health maintenance, like when you should have certain vaccines, screening for sexually transmitted diseases, bone density testing, colonoscopy, mammograms, etc.   MAMMOGRAMS:  All women over 40 years old should have a yearly mammogram. Many facilities now offer a "3D" mammogram, which may cost around $50 extra out of pocket. If possible,  we recommend you accept the option to have the 3D mammogram performed.  It both reduces the number of women who will be called back for extra views which then turn out to be normal, and it is better than the routine mammogram at detecting truly abnormal areas.    COLONOSCOPY:  Colonoscopy to screen for colon cancer is recommended for all women at age 50.  We know, you hate the idea of the prep.  We agree, BUT, having colon cancer and not knowing it is worse!!  Colon cancer so often starts as a polyp that can be seen and removed at colonscopy, which can quite literally save your life!  And if your first colonoscopy is normal and you have no family history of colon cancer, most women don't have to have it again for 10 years.  Once every ten years, you can do something that may end up saving your life, right?  We will be happy to help you get it scheduled when you are ready.    Be sure to check your insurance coverage so you understand how much it will cost.  It may be covered as a preventative service at no cost, but you should check your particular policy.      Breast Self-Awareness Breast self-awareness means being familiar with how your breasts look and feel. It involves checking your breasts regularly and reporting any changes to your health care provider. Practicing breast self-awareness is important. A change in your breasts can be a sign of a serious medical problem. Being familiar with how your breasts look and feel allows  you to find any problems early, when treatment is more likely to be successful. All women should practice breast self-awareness, including women who have had breast implants. How to do a breast self-exam One way to learn what is normal for your breasts and whether your breasts are changing is to do a breast self-exam. To do a breast self-exam: Look for Changes  1. Remove all the clothing above your waist. 2. Stand in front of a mirror in a room with good lighting. 3. Put your hands on your hips. 4. Push your hands firmly downward. 5. Compare your breasts in the mirror. Look for differences between them (asymmetry), such as: ? Differences in shape. ? Differences in size. ? Puckers, dips, and bumps in one breast and not the other. 6. Look at each breast for changes in your skin, such as: ? Redness. ? Scaly areas. 7. Look for changes in your nipples, such as: ? Discharge. ? Bleeding. ? Dimpling. ? Redness. ? A change in position. Feel for Changes  Carefully feel your breasts for lumps and changes. It is best to do this while lying on your back on the floor and again while sitting or standing in the shower or tub with soapy water on your skin. Feel each breast in the following way:  Place the arm on the side of the breast you are examining above your head.  Feel your breast with the other hand.  Start in the nipple area and make  inch (2 cm) overlapping circles to feel your breast. Use the pads of your three middle fingers to do this. Apply light pressure, then medium pressure, then firm pressure. The light pressure will allow you to feel the tissue closest to the skin. The medium pressure will allow you to feel the tissue that is a little deeper. The firm pressure will allow you to feel the tissue close to the ribs.  Continue the overlapping circles, moving downward over the breast until you feel your ribs below your breast.  Move one finger-width toward the center of the body.  Continue to use the  inch (2 cm) overlapping circles to feel your breast as you move slowly up toward your collarbone.  Continue the up and down exam using all three pressures until you reach your armpit.  Write Down What You Find  Write down what is normal for each breast and any changes that you find. Keep a written record with breast changes or normal findings for each breast. By writing this information down, you do not need to depend only on memory for size, tenderness, or location. Write down where you are in your menstrual cycle, if you are still menstruating. If you are having trouble noticing differences in your breasts, do not get discouraged. With time you will become more familiar with the variations in your breasts and more comfortable with the exam. How often should I examine my breasts? Examine   your breasts every month. If you are breastfeeding, the best time to examine your breasts is after a feeding or after using a breast pump. If you menstruate, the best time to examine your breasts is 5-7 days after your period is over. During your period, your breasts are lumpier, and it may be more difficult to notice changes. When should I see my health care provider? See your health care provider if you notice:  A change in shape or size of your breasts or nipples.  A change in the skin of your breast or nipples, such as a reddened or scaly area.  Unusual discharge from your nipples.  A lump or thick area that was not there before.  Pain in your breasts.  Anything that concerns you.  This information is not intended to replace advice given to you by your health care provider. Make sure you discuss any questions you have with your health care provider. Document Released: 03/09/2005 Document Revised: 08/15/2015 Document Reviewed: 01/27/2015 Elsevier Interactive Patient Education  2018 [employer].  

## 2017-12-16 LAB — CYTOLOGY - PAP
DIAGNOSIS: NEGATIVE
HPV (WINDOPATH): NOT DETECTED

## 2018-01-27 DIAGNOSIS — K219 Gastro-esophageal reflux disease without esophagitis: Secondary | ICD-10-CM | POA: Diagnosis not present

## 2018-01-27 DIAGNOSIS — K5730 Diverticulosis of large intestine without perforation or abscess without bleeding: Secondary | ICD-10-CM | POA: Diagnosis not present

## 2018-01-27 DIAGNOSIS — E669 Obesity, unspecified: Secondary | ICD-10-CM | POA: Diagnosis not present

## 2018-01-27 DIAGNOSIS — Z86010 Personal history of colonic polyps: Secondary | ICD-10-CM | POA: Diagnosis not present

## 2018-01-27 DIAGNOSIS — Z1211 Encounter for screening for malignant neoplasm of colon: Secondary | ICD-10-CM | POA: Diagnosis not present

## 2018-02-25 ENCOUNTER — Other Ambulatory Visit: Payer: Self-pay

## 2018-02-25 ENCOUNTER — Ambulatory Visit (INDEPENDENT_AMBULATORY_CARE_PROVIDER_SITE_OTHER): Payer: Medicare Other | Admitting: Family Medicine

## 2018-02-25 ENCOUNTER — Encounter: Payer: Self-pay | Admitting: Family Medicine

## 2018-02-25 ENCOUNTER — Other Ambulatory Visit: Payer: Self-pay | Admitting: Family Medicine

## 2018-02-25 VITALS — BP 132/88 | HR 79 | Temp 98.7°F | Resp 16 | Ht 59.0 in | Wt 148.4 lb

## 2018-02-25 DIAGNOSIS — E785 Hyperlipidemia, unspecified: Secondary | ICD-10-CM

## 2018-02-25 DIAGNOSIS — K30 Functional dyspepsia: Secondary | ICD-10-CM

## 2018-02-25 DIAGNOSIS — R7309 Other abnormal glucose: Secondary | ICD-10-CM

## 2018-02-25 LAB — BASIC METABOLIC PANEL
BUN: 22 mg/dL (ref 6–23)
CHLORIDE: 105 meq/L (ref 96–112)
CO2: 27 mEq/L (ref 19–32)
Calcium: 9.5 mg/dL (ref 8.4–10.5)
Creatinine, Ser: 0.99 mg/dL (ref 0.40–1.20)
GFR: 58.51 mL/min — ABNORMAL LOW (ref >60.00)
Glucose, Bld: 136 mg/dL — ABNORMAL HIGH (ref 70–99)
Potassium: 4.8 mEq/L (ref 3.5–5.1)
Sodium: 140 mEq/L (ref 135–145)

## 2018-02-25 LAB — HEPATIC FUNCTION PANEL
ALK PHOS: 58 U/L (ref 39–117)
ALT: 17 U/L (ref 0–35)
AST: 18 U/L (ref 0–37)
Albumin: 4 g/dL (ref 3.5–5.2)
Bilirubin, Direct: 0 mg/dL (ref 0.0–0.3)
Total Bilirubin: 0.5 mg/dL (ref 0.2–1.2)
Total Protein: 7 g/dL (ref 6.0–8.3)

## 2018-02-25 LAB — LIPID PANEL
CHOL/HDL RATIO: 4
Cholesterol: 144 mg/dL (ref 0–200)
HDL: 38.9 mg/dL — AB (ref >39.00)
LDL CALC: 70 mg/dL (ref 0–99)
NONHDL: 104.68
Triglycerides: 174 mg/dL — ABNORMAL HIGH (ref 0.0–149.0)
VLDL: 34.8 mg/dL (ref 0.0–40.0)

## 2018-02-25 LAB — TSH: TSH: 4.31 u[IU]/mL (ref 0.35–4.50)

## 2018-02-25 MED ORDER — OMEPRAZOLE 20 MG PO CPDR: 20.0000 mg | DELAYED_RELEASE_CAPSULE | Freq: Every day | ORAL | 3 refills | Status: DC

## 2018-02-25 NOTE — Progress Notes (Signed)
   Subjective:    Patient ID: Heather Gallegos, female    DOB: [birth year], 72 y.o.   MRN: [id number]  HPI Hyperlipidemia- chronic problem, on Simvastatin 40mg  daily.  Pt has lost 3 lbs since last visit.  Pt has been 'trying to eat better'.  No CP, SOB, HAs, abd pain, N/V.  'stomach problems'- was under control on Zantac but she is no longer able to take this since it has been pulled off the market.  Increased gas/bloating.     Review of Systems For ROS see HPI     Objective:   Physical Exam  Constitutional: She is oriented to person, place, and time. She appears well-developed and well-nourished. No distress.  HENT:  Head: Normocephalic and atraumatic.  Eyes: Pupils are equal, round, and reactive to light. Conjunctivae and EOM are normal.  Neck: Normal range of motion. Neck supple. No thyromegaly present.  Cardiovascular: Normal rate, regular rhythm, normal heart sounds and intact distal pulses.  No murmur heard. Pulmonary/Chest: Effort normal and breath sounds normal. No respiratory distress.  Abdominal: Soft. She exhibits no distension. There is no tenderness.  Musculoskeletal: She exhibits no edema.  Lymphadenopathy:    She has no cervical adenopathy.  Neurological: She is alert and oriented to person, place, and time.  Skin: Skin is warm and dry.  Psychiatric: She has a normal mood and affect. Her behavior is normal.  Vitals reviewed.         Assessment & Plan:

## 2018-02-25 NOTE — Patient Instructions (Addendum)
Follow up in 6 months to recheck cholesterol We'll notify you of your lab results and make any changes if needed Continue to work on healthy diet and regular exercise- you're doing great! START the Omeprazole once daily (in place of the Zantac) Call and schedule your mammogram! Call with any questions or concerns Happy Holidays!

## 2018-02-25 NOTE — Assessment & Plan Note (Signed)
Chronic problem.  Tolerating statin w/o difficulty.  Check labs.  Adjust meds prn  

## 2018-02-25 NOTE — Assessment & Plan Note (Signed)
Deteriorated since stopping Zantac.  Start Omeprazole and monitor for symptom improvement.

## 2018-02-26 ENCOUNTER — Other Ambulatory Visit: Payer: Self-pay | Admitting: Family Medicine

## 2018-02-26 DIAGNOSIS — E785 Hyperlipidemia, unspecified: Secondary | ICD-10-CM

## 2018-02-28 DIAGNOSIS — K635 Polyp of colon: Secondary | ICD-10-CM | POA: Diagnosis not present

## 2018-02-28 DIAGNOSIS — K5730 Diverticulosis of large intestine without perforation or abscess without bleeding: Secondary | ICD-10-CM | POA: Diagnosis not present

## 2018-02-28 DIAGNOSIS — Z1211 Encounter for screening for malignant neoplasm of colon: Secondary | ICD-10-CM | POA: Diagnosis not present

## 2018-02-28 DIAGNOSIS — Z86010 Personal history of colonic polyps: Secondary | ICD-10-CM | POA: Diagnosis not present

## 2018-02-28 DIAGNOSIS — D122 Benign neoplasm of ascending colon: Secondary | ICD-10-CM | POA: Diagnosis not present

## 2018-02-28 LAB — HM COLONOSCOPY

## 2018-03-01 ENCOUNTER — Encounter: Payer: Self-pay | Admitting: General Practice

## 2018-03-01 ENCOUNTER — Other Ambulatory Visit (INDEPENDENT_AMBULATORY_CARE_PROVIDER_SITE_OTHER): Payer: Medicare Other

## 2018-03-01 DIAGNOSIS — R7309 Other abnormal glucose: Secondary | ICD-10-CM

## 2018-03-01 LAB — HEMOGLOBIN A1C: Hgb A1c MFr Bld: 6.6 % — ABNORMAL HIGH (ref 4.6–6.5)

## 2018-04-25 ENCOUNTER — Ambulatory Visit (INDEPENDENT_AMBULATORY_CARE_PROVIDER_SITE_OTHER): Payer: Medicare Other | Admitting: Family Medicine

## 2018-04-25 ENCOUNTER — Encounter: Payer: Self-pay | Admitting: Family Medicine

## 2018-04-25 ENCOUNTER — Other Ambulatory Visit: Payer: Self-pay

## 2018-04-25 VITALS — BP 128/83 | HR 63 | Temp 98.1°F | Resp 16 | Ht 59.0 in | Wt 145.5 lb

## 2018-04-25 DIAGNOSIS — B9689 Other specified bacterial agents as the cause of diseases classified elsewhere: Secondary | ICD-10-CM

## 2018-04-25 DIAGNOSIS — J329 Chronic sinusitis, unspecified: Secondary | ICD-10-CM | POA: Diagnosis not present

## 2018-04-25 MED ORDER — AMOXICILLIN 875 MG PO TABS: 875.0000 mg | ORAL_TABLET | Freq: Two times a day (BID) | ORAL | 0 refills | Status: DC

## 2018-04-25 NOTE — Progress Notes (Signed)
   Subjective:    Patient ID: Heather Gallegos, female    DOB: 1945-06-07, 73 y.o.   MRN: [id number]  HPI URI- sxs started ~2 weeks ago.  Flew to [facility] which worsened her sxs.  Minimal PND.  'it's just stuck'.  + facial pressure.  Will get bloody, green discharge after using nasal saline rinse.  No fever.  + upper tooth pain.  R ear pain.  No N/V.  Minimal cough.  R subconjunctival hemorrhage.   Review of Systems For ROS see HPI     Objective:   Physical Exam Vitals signs reviewed.  Constitutional:      General: She is not in acute distress.    Appearance: She is well-developed.  HENT:     Head: Normocephalic and atraumatic.     Right Ear: Tympanic membrane normal.     Left Ear: Tympanic membrane normal.     Nose: Mucosal edema and rhinorrhea present.     Right Sinus: Maxillary sinus tenderness present. No frontal sinus tenderness.     Left Sinus: Maxillary sinus tenderness present. No frontal sinus tenderness.     Mouth/Throat:     Pharynx: Uvula midline. Posterior oropharyngeal erythema present. No oropharyngeal exudate.  Eyes:     Conjunctiva/sclera: Conjunctivae normal.     Pupils: Pupils are equal, round, and reactive to light.  Neck:     Musculoskeletal: Normal range of motion and neck supple.  Cardiovascular:     Rate and Rhythm: Normal rate and regular rhythm.     Heart sounds: Normal heart sounds.  Pulmonary:     Effort: Pulmonary effort is normal. No respiratory distress.     Breath sounds: Normal breath sounds. No wheezing.  Lymphadenopathy:     Cervical: No cervical adenopathy.           Assessment & Plan:  Bacterial sinusitis- pt has hx of this, sxs are similar.  PE consistent w/ infxn.  Start abx.  Reviewed supportive care and red flags that should prompt return.  Pt expressed understanding and is in agreement w/ plan.

## 2018-04-25 NOTE — Patient Instructions (Signed)
Follow up as needed or as scheduled START the Amoxicillin twice daily- take w/ food Drink plenty of fluids RESTART the Zyrtec daily REST! Call with any questions or concerns Hang in there!!!

## 2018-05-12 ENCOUNTER — Telehealth: Payer: Self-pay | Admitting: Obstetrics and Gynecology

## 2018-05-12 NOTE — Telephone Encounter (Signed)
Patient called and requested an appointment with Dr. Talbert Nan today for a possible yeast infection. She said she recently took some antibiotics prescribed by another doctor that may have caused the problem.

## 2018-05-12 NOTE — Telephone Encounter (Signed)
Spoke with patient. Reports vaginal burning and yellow/brown vaginal d/c that started this wk. Completed Abx for sinus infection over a wk ago. Denies odor, vaginal bleeding, urinary symptoms, fever/chills, N/V. Has tried monistat in the past and made symptoms worse, request OV.   OV scheduled for 2/21 at 2pm with Dr. Talbert Nan.   Last AEX 12/15/17  Routing to provider for final review. Patient is agreeable to disposition. Will close encounter.

## 2018-05-13 ENCOUNTER — Ambulatory Visit: Payer: Self-pay | Admitting: Obstetrics and Gynecology

## 2018-05-13 ENCOUNTER — Encounter: Payer: Self-pay | Admitting: Obstetrics and Gynecology

## 2018-05-13 ENCOUNTER — Other Ambulatory Visit: Payer: Self-pay

## 2018-05-13 ENCOUNTER — Ambulatory Visit (INDEPENDENT_AMBULATORY_CARE_PROVIDER_SITE_OTHER): Payer: Medicare Other | Admitting: Obstetrics and Gynecology

## 2018-05-13 VITALS — BP 140/72 | HR 76 | Resp 16 | Ht 59.5 in | Wt 147.0 lb

## 2018-05-13 DIAGNOSIS — N760 Acute vaginitis: Secondary | ICD-10-CM | POA: Diagnosis not present

## 2018-05-13 MED ORDER — METRONIDAZOLE 500 MG PO TABS: 500.0000 mg | ORAL_TABLET | Freq: Two times a day (BID) | ORAL | 0 refills | Status: DC

## 2018-05-13 NOTE — Progress Notes (Signed)
GYNECOLOGY  VISIT   HPI: 73 y.o.   Married White or Caucasian Not Hispanic or Latino  female   [phone] with Patient's last menstrual period was 03/23/1998.   here for   Vaginal burning and discharge that started this week. Completed antibiotic for sinus infection last week. The vaginal irritation and burning started about 3 days after finishing the antibiotic. Noted a slight yellow/brown d/c earlier in the week. Mild itching.  No urinary c/o.  She has a known uterine prolapse, tolerable.   GYNECOLOGIC HISTORY: Patient's last menstrual period was 03/23/1998. Contraception: Post-menopausal  Menopausal hormone therapy: none         OB History    Gravida  2   Para  2   Term  2   Preterm  0   AB  0   Living  2     SAB      TAB      Ectopic      Multiple      Live Births  2              Patient Active Problem List   Diagnosis Date Noted  . Indigestion 02/25/2018  . Physical exam 08/21/2016  . Change of skin color 09/05/2015  . Localized swelling of lower leg 08/28/2015  . Hyperlipidemia   . Uterine prolapse     Past Medical History:  Diagnosis Date  . Chronic sinusitis   . Clostridium difficile infection 11/2015  . Elevated cholesterol   . Environmental allergies   . Fibroid   . Leg edema, left   . Low back pain   . Uterine prolapse 2010    Past Surgical History:  Procedure Laterality Date  . TONSILLECTOMY AND ADENOIDECTOMY      Current Outpatient Medications  Medication Sig Dispense Refill  . b complex vitamins tablet Take 1 tablet by mouth daily.    . Biotin 1000 MCG tablet Take 1,000 mcg by mouth daily.    . Calcium Carbonate (CALCIUM 600 PO) Take by mouth daily.    . Cetirizine HCl (ZYRTEC PO) Take by mouth as needed.    . Cholecalciferol (VITAMIN D3) 1000 units CAPS     . Coenzyme Q10 (COQ10) 100 MG CAPS     . fluticasone (FLONASE) 50 MCG/ACT nasal spray daily as needed.     . Glucosamine 500 MG CAPS Take by mouth.    .  Lactobacillus-Inulin (PROBIOTIC DIGESTIVE SUPPORT PO) Take by mouth.    . LUTEIN PO Take 20 mg by mouth daily.    Marland Kitchen omeprazole (PRILOSEC) 20 MG capsule Take 1 capsule (20 mg total) by mouth daily. 30 capsule 3  . simvastatin (ZOCOR) 40 MG tablet TAKE 1 TABLET BY MOUTH ONCE DAILY IN THE EVENING 90 tablet 1  . vitamin C (ASCORBIC ACID) 500 MG tablet Take 500 mg by mouth daily.    . vitamin E 400 UNIT capsule Take 400 Units by mouth daily.     No current facility-administered medications for this visit.      ALLERGIES: Patient has no known allergies.  Family History  Problem Relation Age of Onset  . Cancer Mother 94       Stomach cancer  . Hypertension Brother   . Diabetes Brother        Type II  . Osteoporosis Maternal Aunt     Social History   Socioeconomic History  . Marital status: Married    Spouse name: Not on file  . Number of  children: Not on file  . Years of education: Not on file  . Highest education level: Not on file  Occupational History  . Not on file  Social Needs  . Financial resource strain: Not on file  . Food insecurity:    Worry: Not on file    Inability: Not on file  . Transportation needs:    Medical: Not on file    Non-medical: Not on file  Tobacco Use  . Smoking status: Never Smoker  . Smokeless tobacco: Never Used  Substance and Sexual Activity  . Alcohol use: No    Alcohol/week: 0.0 standard drinks  . Drug use: No  . Sexual activity: Yes    Partners: Male    Birth control/protection: Post-menopausal  Lifestyle  . Physical activity:    Days per week: Not on file    Minutes per session: Not on file  . Stress: Not on file  Relationships  . Social connections:    Talks on phone: Not on file    Gets together: Not on file    Attends religious service: Not on file    Active member of club or organization: Not on file    Attends meetings of clubs or organizations: Not on file    Relationship status: Not on file  . Intimate partner  violence:    Fear of current or ex partner: Not on file    Emotionally abused: Not on file    Physically abused: Not on file    Forced sexual activity: Not on file  Other Topics Concern  . Not on file  Social History Narrative  . Not on file    Review of Systems  Constitutional: Negative.   HENT: Negative.   Eyes: Negative.   Respiratory: Negative.   Cardiovascular: Negative.   Gastrointestinal: Negative.   Genitourinary:       Vulvar/vaginal burning Abnormal discharge  Musculoskeletal: Negative.   Skin: Negative.   Neurological: Negative.   Endo/Heme/Allergies: Negative.   Psychiatric/Behavioral: Negative.     PHYSICAL EXAMINATION:    BP 140/72 (BP Location: Right Arm, Patient Position: Sitting, Cuff Size: Normal)   Pulse 76   Resp 16   Ht 4' 11.5" (1.511 m)   Wt 147 lb (66.7 kg)   LMP 03/23/1998   BMI 29.19 kg/m     General appearance: alert, cooperative and appears stated age  Pelvic: External genitalia:  no lesions, no significant erythema              Urethra:  normal appearing urethra with no masses, tenderness or lesions              Bartholins and Skenes: normal                 Vagina: normal appearing vagina with a slight increase in white, thin vaginal discharge              Cervix: no lesions and at her introitus               Chaperone was present for exam.  Wet prep: + clue, no trich, few wbc KOH: no yeast PH: 5   ASSESSMENT Vaginitis, BV on vaginal slides     PLAN Flagyl for BV Send affirm to r/o yeast    An After Visit Summary was printed and given to the patient.

## 2018-05-13 NOTE — Patient Instructions (Signed)
Vaginitis  Vaginitis is a condition in which the vaginal tissue swells and becomes red (inflamed). This condition is most often caused by a change in the normal balance of bacteria and yeast that live in the vagina. This change causes an overgrowth of certain bacteria or yeast, which causes the inflammation. There are different types of vaginitis, but the most common types are:   Bacterial vaginosis.   Yeast infection (candidiasis).   Trichomoniasis vaginitis. This is a sexually transmitted disease (STD).   Viral vaginitis.   Atrophic vaginitis.   Allergic vaginitis.  What are the causes?  The cause of this condition depends on the type of vaginitis. It can be caused by:   Bacteria (bacterial vaginosis).   Yeast, which is a fungus (yeast infection).   A parasite (trichomoniasis vaginitis).   A virus (viral vaginitis).   Low hormone levels (atrophic vaginitis). Low hormone levels can occur during pregnancy, breastfeeding, or after menopause.   Irritants, such as bubble baths, scented tampons, and feminine sprays (allergic vaginitis).  Other factors can change the normal balance of the yeast and bacteria that live in the vagina. These include:   Antibiotic medicines.   Poor hygiene.   Diaphragms, vaginal sponges, spermicides, birth control pills, and intrauterine devices (IUD).   Sex.   Infection.   Uncontrolled diabetes.   A weakened defense (immune) system.  What increases the risk?  This condition is more likely to develop in women who:   Smoke.   Use vaginal douches, scented tampons, or scented sanitary pads.   Wear tight-fitting pants.   Wear thong underwear.   Use oral birth control pills or an IUD.   Have sex without a condom.   Have multiple sex partners.   Have an STD.   Frequently use the spermicide nonoxynol-9.   Eat lots of foods high in sugar.   Have uncontrolled diabetes.   Have low estrogen levels.   Have a weakened immune system from an immune disorder or medical  treatment.   Are pregnant or breastfeeding.  What are the signs or symptoms?  Symptoms vary depending on the cause of the vaginitis. Common symptoms include:   Abnormal vaginal discharge.  ? The discharge is white, gray, or yellow with bacterial vaginosis.  ? The discharge is thick, white, and cheesy with a yeast infection.  ? The discharge is frothy and yellow or greenish with trichomoniasis.   A bad vaginal smell. The smell is fishy with bacterial vaginosis.   Vaginal itching, pain, or swelling.   Sex that is painful.   Pain or burning when urinating.  Sometimes there are no symptoms.  How is this diagnosed?  This condition is diagnosed based on your symptoms and medical history. A physical exam, including a pelvic exam, will also be done. You may also have other tests, including:   Tests to determine the pH level (acidity or alkalinity) of your vagina.   A whiff test, to assess the odor that results when a sample of your vaginal discharge is mixed with a potassium hydroxide solution.   Tests of vaginal fluid. A sample will be examined under a microscope.  How is this treated?  Treatment varies depending on the type of vaginitis you have. Your treatment may include:   Antibiotic creams or pills to treat bacterial vaginosis and trichomoniasis.   Antifungal medicines, such as vaginal creams or suppositories, to treat a yeast infection.   Medicine to ease discomfort if you have viral vaginitis. Your sexual partner   should also be treated.   Estrogen delivered in a cream, pill, suppository, or vaginal ring to treat atrophic vaginitis. If vaginal dryness occurs, lubricants and moisturizing creams may help. You may need to avoid scented soaps, sprays, or douches.   Stopping use of a product that is causing allergic vaginitis. Then using a vaginal cream to treat the symptoms.  Follow these instructions at home:  Lifestyle   Keep your genital area clean and dry. Avoid soap, and only rinse the area with  water.   Do not douche or use tampons until your health care provider says it is okay to do so. Use sanitary pads, if needed.   Do not have sex until your health care provider approves. When you can return to sex, practice safe sex and use condoms.   Wipe from front to back. This avoids the spread of bacteria from the rectum to the vagina.  General instructions   Take over-the-counter and prescription medicines only as told by your health care provider.   If you were prescribed an antibiotic medicine, take or use it as told by your health care provider. Do not stop taking or using the antibiotic even if you start to feel better.   Keep all follow-up visits as told by your health care provider. This is important.  How is this prevented?   Use mild, non-scented products. Do not use things that can irritate the vagina, such as fabric softeners. Avoid the following products if they are scented:  ? Feminine sprays.  ? Detergents.  ? Tampons.  ? Feminine hygiene products.  ? Soaps or bubble baths.   Let air reach your genital area.  ? Wear cotton underwear to reduce moisture buildup.  ? Avoid wearing underwear while you sleep.  ? Avoid wearing tight pants and underwear or nylons without a cotton panel.  ? Avoid wearing thong underwear.   Take off any wet clothing, such as bathing suits, as soon as possible.   Practice safe sex and use condoms.  Contact a health care provider if:   You have abdominal pain.   You have a fever.   You have symptoms that last for more than 2-3 days.  Get help right away if:   You have a fever and your symptoms suddenly get worse.  Summary   Vaginitis is a condition in which the vaginal tissue becomes inflamed.This condition is most often caused by a change in the normal balance of bacteria and yeast that live in the vagina.   Treatment varies depending on the type of vaginitis you have.   Do not douche, use tampons , or have sex until your health care provider approves. When  you can return to sex, practice safe sex and use condoms.  This information is not intended to replace advice given to you by your health care provider. Make sure you discuss any questions you have with your health care provider.  Document Released: 01/04/2007 Document Revised: 04/14/2016 Document Reviewed: 04/14/2016  Elsevier Interactive Patient Education  2019 [employer] Inc[employer]

## 2018-05-14 LAB — VAGINITIS/VAGINOSIS, DNA PROBE
CANDIDA SPECIES: NEGATIVE
Gardnerella vaginalis: POSITIVE — AB
Trichomonas vaginosis: NEGATIVE

## 2018-06-23 ENCOUNTER — Other Ambulatory Visit: Payer: Self-pay | Admitting: Family Medicine

## 2018-08-11 ENCOUNTER — Other Ambulatory Visit: Payer: Self-pay | Admitting: Family Medicine

## 2018-08-25 ENCOUNTER — Encounter: Payer: Self-pay | Admitting: Family Medicine

## 2018-08-25 ENCOUNTER — Ambulatory Visit: Payer: Medicare Other | Admitting: Family Medicine

## 2018-08-25 ENCOUNTER — Other Ambulatory Visit: Payer: Self-pay

## 2018-08-25 ENCOUNTER — Ambulatory Visit (INDEPENDENT_AMBULATORY_CARE_PROVIDER_SITE_OTHER): Payer: Medicare Other | Admitting: Family Medicine

## 2018-08-25 VITALS — BP 135/72 | HR 67 | Temp 97.1°F | Ht 59.0 in | Wt 144.0 lb

## 2018-08-25 DIAGNOSIS — R197 Diarrhea, unspecified: Secondary | ICD-10-CM | POA: Diagnosis not present

## 2018-08-25 DIAGNOSIS — E785 Hyperlipidemia, unspecified: Secondary | ICD-10-CM | POA: Diagnosis not present

## 2018-08-25 DIAGNOSIS — E663 Overweight: Secondary | ICD-10-CM | POA: Diagnosis not present

## 2018-08-25 NOTE — Progress Notes (Signed)
Virtual Visit via Video   I connected with patient on 08/25/18 at  9:40 AM EDT by a video enabled telemedicine application and verified that I am speaking with the correct person using two identifiers.  Location patient: Home Location provider: Acup[facility], Office Persons participating in the virtual visit: Patient, Provider, [place] (Jess B)  I discussed the limitations of evaluation and management by telemedicine and the availability of in person appointments. The patient expressed understanding and agreed to proceed.  Subjective:   HPI:   Hyperlipidemia- chronic problem, on Simvastatin 40mg  daily.  No CP, SOB, abd pain, but + gas and diarrhea.    Overweight- pt is down 4 lbs since last visit.  Exercising more regularly and watching her diet.  Now eating low carb.  Diarrhea- pt has hx of C Diff.  Reports this feels similar.  sxs started 3-4 weeks ago.  Will have 2-3 days of loose/watery stools and then will have a day or 2 of normal stools and then it comes back.  + gas and bloating.  Denies abd pain.    ROS:   See pertinent positives and negatives per HPI.  Patient Active Problem List   Diagnosis Date Noted  . Indigestion 02/25/2018  . Physical exam 08/21/2016  . Change of skin color 09/05/2015  . Localized swelling of lower leg 08/28/2015  . Hyperlipidemia   . Uterine prolapse     Social History   Tobacco Use  . Smoking status: Never Smoker  . Smokeless tobacco: Never Used  Substance Use Topics  . Alcohol use: No    Alcohol/week: 0.0 standard drinks    Current Outpatient Medications:  .  b complex vitamins tablet, Take 1 tablet by mouth daily., Disp: , Rfl:  .  Biotin 1000 MCG tablet, Take 1,000 mcg by mouth daily., Disp: , Rfl:  .  Calcium Carbonate (CALCIUM 600 PO), Take by mouth daily., Disp: , Rfl:  .  Cetirizine HCl (ZYRTEC PO), Take by mouth as needed., Disp: , Rfl:  .  Cholecalciferol (VITAMIN D3) 1000 units CAPS, , Disp: , Rfl:  .  Coenzyme Q10  (COQ10) 100 MG CAPS, , Disp: , Rfl:  .  fluticasone (FLONASE) 50 MCG/ACT nasal spray, daily as needed. , Disp: , Rfl:  .  Glucosamine 500 MG CAPS, Take by mouth., Disp: , Rfl:  .  Lactobacillus-Inulin (PROBIOTIC DIGESTIVE SUPPORT PO), Take by mouth., Disp: , Rfl:  .  LUTEIN PO, Take 20 mg by mouth daily., Disp: , Rfl:  .  omeprazole (PRILOSEC) 20 MG capsule, Take 1 capsule by mouth once daily, Disp: 90 capsule, Rfl: 1 .  simvastatin (ZOCOR) 40 MG tablet, TAKE 1 TABLET BY MOUTH ONCE DAILY IN THE EVENING, Disp: 90 tablet, Rfl: 1 .  vitamin C (ASCORBIC ACID) 500 MG tablet, Take 500 mg by mouth daily., Disp: , Rfl:  .  vitamin E 400 UNIT capsule, Take 400 Units by mouth daily., Disp: , Rfl:   No Known Allergies  Objective:   BP 135/72   Pulse 67   Temp (!) 97.1 F (36.2 C) (Tympanic)   Ht 4\' 11"  (1.499 m)   Wt 144 lb (65.3 kg)   LMP 03/23/1998   BMI 29.08 kg/m   AAOx3, NAD NCAT, EOMI No obvious CN deficits Coloring WNL Pt is able to speak clearly, coherently without shortness of breath or increased work of breathing.  Thought process is linear.  Mood is appropriate.   Assessment and Plan:   Hyperlipidemia- ongoing  problem for pt.  Tolerating statin w/o difficulty.  Check labs.  Adjust meds prn   Overweight- pt is down 4 lbs.  Applauded her efforts.  Encouraged her to continue healthy diet and regular exercise.  Will follow.  Diarrhea- pt reports this is similar to when she had C Diff a few years ago.  Will do stool studies prior to treating.  Will follow.   Annye Asa, MD 08/25/2018

## 2018-08-25 NOTE — Progress Notes (Signed)
I have discussed the procedure for the virtual visit with the patient who has given consent to proceed with assessment and treatment.   Jessica L Brodmerkel, CMA     

## 2018-08-26 ENCOUNTER — Ambulatory Visit: Payer: Medicare Other

## 2018-08-26 ENCOUNTER — Telehealth: Payer: Self-pay | Admitting: Family Medicine

## 2018-08-26 DIAGNOSIS — R197 Diarrhea, unspecified: Secondary | ICD-10-CM

## 2018-08-26 NOTE — Addendum Note (Signed)
Addended by: Doran Clay A on: 08/26/2018 03:32 PM   Modules accepted: Orders

## 2018-08-26 NOTE — Telephone Encounter (Signed)
LM asking pt to call back to schedule 33mth f/up for chol. With [employer]

## 2018-08-28 ENCOUNTER — Other Ambulatory Visit: Payer: Self-pay | Admitting: Family Medicine

## 2018-08-28 DIAGNOSIS — E785 Hyperlipidemia, unspecified: Secondary | ICD-10-CM

## 2018-08-30 LAB — STOOL CULTURE: E coli, Shiga toxin Assay: NEGATIVE

## 2018-08-30 LAB — CLOSTRIDIUM DIFFICILE BY PCR

## 2018-08-30 LAB — SPECIMEN STATUS REPORT

## 2018-08-31 ENCOUNTER — Encounter: Payer: Self-pay | Admitting: Physician Assistant

## 2018-08-31 ENCOUNTER — Other Ambulatory Visit: Payer: Self-pay

## 2018-08-31 ENCOUNTER — Ambulatory Visit (INDEPENDENT_AMBULATORY_CARE_PROVIDER_SITE_OTHER): Payer: Medicare Other | Admitting: Physician Assistant

## 2018-08-31 ENCOUNTER — Telehealth: Payer: Self-pay | Admitting: Family Medicine

## 2018-08-31 DIAGNOSIS — B9689 Other specified bacterial agents as the cause of diseases classified elsewhere: Secondary | ICD-10-CM

## 2018-08-31 DIAGNOSIS — J019 Acute sinusitis, unspecified: Secondary | ICD-10-CM | POA: Diagnosis not present

## 2018-08-31 MED ORDER — DOXYCYCLINE HYCLATE 100 MG PO CAPS: 100.0000 mg | ORAL_CAPSULE | Freq: Two times a day (BID) | ORAL | 0 refills | Status: DC

## 2018-08-31 NOTE — Telephone Encounter (Signed)
Please advise 

## 2018-08-31 NOTE — Progress Notes (Signed)
I have discussed the procedure for the virtual visit with the patient who has given consent to proceed with assessment and treatment.   Heather Gallegos L Heather Gallegos, CMA     

## 2018-08-31 NOTE — Telephone Encounter (Signed)
Pt will need a virtual visit to arrange testing and ensure we're not missing something else

## 2018-08-31 NOTE — Progress Notes (Signed)
Virtual Visit via Video   I connected with patient on 08/31/18 at  3:30 PM EDT by a video enabled telemedicine application and verified that I am speaking with the correct person using two identifiers.  Location patient: Home Location provider: [facility], Office Persons participating in the virtual visit: Patient, Provider, [place] (Jessica Brodmerkel)  I discussed the limitations of evaluation and management by telemedicine and the availability of in person appointments. The patient expressed understanding and agreed to proceed.  Subjective:   HPI:   Patient presents today via [employer] for assessment of sinus symptoms. Notes 2 week history of scratchy throat, R ear pain, sinus pain and tooth pain. Notes some painful swallowing but mild. Denies fever, chills, chest pain, SOB, GI symptoms, change in smell/taste. Has been working from home until the past week. Denies recent travel or sick contact. Is currently on zyrtec and sudafed with.some improvement  ROS:   See pertinent positives and negatives per HPI.  Patient Active Problem List   Diagnosis Date Noted  . Overweight (BMI 25.0-29.9) 08/25/2018  . Indigestion 02/25/2018  . Physical exam 08/21/2016  . Change of skin color 09/05/2015  . Localized swelling of lower leg 08/28/2015  . Hyperlipidemia   . Uterine prolapse     Social History   Tobacco Use  . Smoking status: Never Smoker  . Smokeless tobacco: Never Used  Substance Use Topics  . Alcohol use: No    Alcohol/week: 0.0 standard drinks    Current Outpatient Medications:  .  b complex vitamins tablet, Take 1 tablet by mouth daily., Disp: , Rfl:  .  Biotin 1000 MCG tablet, Take 1,000 mcg by mouth daily., Disp: , Rfl:  .  Calcium Carbonate (CALCIUM 600 PO), Take by mouth daily., Disp: , Rfl:  .  Cetirizine HCl (ZYRTEC PO), Take by mouth as needed., Disp: , Rfl:  .  Cholecalciferol (VITAMIN D3) 1000 units CAPS, , Disp: , Rfl:  .  Coenzyme Q10 (COQ10) 100 MG  CAPS, , Disp: , Rfl:  .  fluticasone (FLONASE) 50 MCG/ACT nasal spray, daily as needed. , Disp: , Rfl:  .  Glucosamine 500 MG CAPS, Take by mouth., Disp: , Rfl:  .  Lactobacillus-Inulin (PROBIOTIC DIGESTIVE SUPPORT PO), Take by mouth., Disp: , Rfl:  .  LUTEIN PO, Take 20 mg by mouth daily., Disp: , Rfl:  .  omeprazole (PRILOSEC) 20 MG capsule, Take 1 capsule by mouth once daily, Disp: 90 capsule, Rfl: 1 .  simvastatin (ZOCOR) 40 MG tablet, TAKE 1 TABLET BY MOUTH ONCE DAILY IN THE EVENING, Disp: 90 tablet, Rfl: 0 .  vitamin C (ASCORBIC ACID) 500 MG tablet, Take 500 mg by mouth daily., Disp: , Rfl:  .  vitamin E 400 UNIT capsule, Take 400 Units by mouth daily., Disp: , Rfl:   No Known Allergies  Objective:   LMP 03/23/1998   Patient is well-developed, well-nourished in no acute distress.  Resting comfortably at home.  Head is normocephalic, atraumatic.  No labored breathing.  Speech is clear and coherent with logical contest.  Patient is alert and oriented at baseline.  + TTP maxillary sinuses endorsed by pt  Assessment and Plan:        1. Acute bacterial sinusitis Rx Doxycycline.  Increase fluids.  Rest.  Saline nasal spray.  Probiotic.  Mucinex as directed.  Humidifier in bedroom. Continue Flonase.  Call or return to clinic if symptoms are not improving.Leeanne Rio, PA-C 08/31/2018

## 2018-08-31 NOTE — Patient Instructions (Addendum)
Instructions sent to MyChart.  Please take antibiotic as directed.  Increase fluid intake.  Use Saline nasal spray.  Take a daily multivitamin. Continue Flonase as directed.  Place a humidifier in the bedroom.  Please call or return clinic if symptoms are not improving.  Sinusitis Sinusitis is redness, soreness, and swelling (inflammation) of the paranasal sinuses. Paranasal sinuses are air pockets within the bones of your face (beneath the eyes, the middle of the forehead, or above the eyes). In healthy paranasal sinuses, mucus is able to drain out, and air is able to circulate through them by way of your nose. However, when your paranasal sinuses are inflamed, mucus and air can become trapped. This can allow bacteria and other germs to grow and cause infection. Sinusitis can develop quickly and last only a short time (acute) or continue over a long period (chronic). Sinusitis that lasts for more than 12 weeks is considered chronic.  CAUSES  Causes of sinusitis include:  Allergies.  Structural abnormalities, such as displacement of the cartilage that separates your nostrils (deviated septum), which can decrease the air flow through your nose and sinuses and affect sinus drainage.  Functional abnormalities, such as when the small hairs (cilia) that line your sinuses and help remove mucus do not work properly or are not present. SYMPTOMS  Symptoms of acute and chronic sinusitis are the same. The primary symptoms are pain and pressure around the affected sinuses. Other symptoms include:  Upper toothache.  Earache.  Headache.  Bad breath.  Decreased sense of smell and taste.  A cough, which worsens when you are lying flat.  Fatigue.  Fever.  Thick drainage from your nose, which often is green and may contain pus (purulent).  Swelling and warmth over the affected sinuses. DIAGNOSIS  Your caregiver will perform a physical exam. During the exam, your caregiver may:  Look in your  nose for signs of abnormal growths in your nostrils (nasal polyps).  Tap over the affected sinus to check for signs of infection.  View the inside of your sinuses (endoscopy) with a special imaging device with a light attached (endoscope), which is inserted into your sinuses. If your caregiver suspects that you have chronic sinusitis, one or more of the following tests may be recommended:  Allergy tests.  Nasal culture A sample of mucus is taken from your nose and sent to a lab and screened for bacteria.  Nasal cytology A sample of mucus is taken from your nose and examined by your caregiver to determine if your sinusitis is related to an allergy. TREATMENT  Most cases of acute sinusitis are related to a viral infection and will resolve on their own within 10 days. Sometimes medicines are prescribed to help relieve symptoms (pain medicine, decongestants, nasal steroid sprays, or saline sprays).  However, for sinusitis related to a bacterial infection, your caregiver will prescribe antibiotic medicines. These are medicines that will help kill the bacteria causing the infection.  Rarely, sinusitis is caused by a fungal infection. In theses cases, your caregiver will prescribe antifungal medicine. For some cases of chronic sinusitis, surgery is needed. Generally, these are cases in which sinusitis recurs more than 3 times per year, despite other treatments. HOME CARE INSTRUCTIONS   Drink plenty of water. Water helps thin the mucus so your sinuses can drain more easily.  Use a humidifier.  Inhale steam 3 to 4 times a day (for example, sit in the bathroom with the shower running).  Apply a warm, moist washcloth  to your face 3 to 4 times a day, or as directed by your caregiver.  Use saline nasal sprays to help moisten and clean your sinuses.  Take over-the-counter or prescription medicines for pain, discomfort, or fever only as directed by your caregiver. SEEK IMMEDIATE MEDICAL CARE IF:   You have increasing pain or severe headaches.  You have nausea, vomiting, or drowsiness.  You have swelling around your face.  You have vision problems.  You have a stiff neck.  You have difficulty breathing. MAKE SURE YOU:   Understand these instructions.  Will watch your condition.  Will get help right away if you are not doing well or get worse. Document Released: 03/09/2005 Document Revised: 06/01/2011 Document Reviewed: 03/24/2011 [facility] Patient Information 2014 [place], [place].

## 2018-08-31 NOTE — Telephone Encounter (Signed)
Called pt to go over [place] screening pt states for the last 2wks she has had Ear pain, Sinus issues, and sore/scratchy throat. Pt wanted to know if she should be tested for COVID? Pt can be reached at the home #

## 2018-08-31 NOTE — Telephone Encounter (Signed)
Pt was scheduled today for appt with PA.

## 2018-09-01 ENCOUNTER — Ambulatory Visit: Payer: Medicare Other

## 2018-09-03 LAB — CLOSTRIDIUM DIFFICILE BY PCR: Toxigenic C. Difficile by PCR: NEGATIVE

## 2018-09-03 LAB — SPECIMEN STATUS REPORT

## 2018-09-19 ENCOUNTER — Other Ambulatory Visit: Payer: Self-pay

## 2018-09-19 ENCOUNTER — Ambulatory Visit (INDEPENDENT_AMBULATORY_CARE_PROVIDER_SITE_OTHER): Payer: Medicare Other

## 2018-09-19 DIAGNOSIS — E785 Hyperlipidemia, unspecified: Secondary | ICD-10-CM

## 2018-09-19 LAB — HEPATIC FUNCTION PANEL
ALT: 18 U/L (ref 0–35)
AST: 19 U/L (ref 0–37)
Albumin: 4.1 g/dL (ref 3.5–5.2)
Alkaline Phosphatase: 55 U/L (ref 39–117)
Bilirubin, Direct: 0.1 mg/dL (ref 0.0–0.3)
Total Bilirubin: 0.4 mg/dL (ref 0.2–1.2)
Total Protein: 6.5 g/dL (ref 6.0–8.3)

## 2018-09-19 LAB — LIPID PANEL
Cholesterol: 152 mg/dL (ref 0–200)
HDL: 44.5 mg/dL (ref >39.00)
LDL Cholesterol: 80 mg/dL (ref 0–99)
NonHDL: 107.34
Total CHOL/HDL Ratio: 3
Triglycerides: 136 mg/dL (ref 0.0–149.0)
VLDL: 27.2 mg/dL (ref 0.0–40.0)

## 2018-09-19 LAB — BASIC METABOLIC PANEL
BUN: 21 mg/dL (ref 6–23)
CO2: 26 mEq/L (ref 19–32)
Calcium: 8.9 mg/dL (ref 8.4–10.5)
Chloride: 106 mEq/L (ref 96–112)
Creatinine, Ser: 0.89 mg/dL (ref 0.40–1.20)
GFR: 62.15 mL/min (ref >60.00)
Glucose, Bld: 119 mg/dL — ABNORMAL HIGH (ref 70–99)
Potassium: 4.5 mEq/L (ref 3.5–5.1)
Sodium: 140 mEq/L (ref 135–145)

## 2018-10-05 DIAGNOSIS — H04123 Dry eye syndrome of bilateral lacrimal glands: Secondary | ICD-10-CM | POA: Diagnosis not present

## 2018-10-05 DIAGNOSIS — H02831 Dermatochalasis of right upper eyelid: Secondary | ICD-10-CM | POA: Diagnosis not present

## 2018-10-05 DIAGNOSIS — H02834 Dermatochalasis of left upper eyelid: Secondary | ICD-10-CM | POA: Diagnosis not present

## 2018-10-05 DIAGNOSIS — H2513 Age-related nuclear cataract, bilateral: Secondary | ICD-10-CM | POA: Diagnosis not present

## 2018-10-24 ENCOUNTER — Other Ambulatory Visit: Payer: Self-pay

## 2018-10-24 ENCOUNTER — Ambulatory Visit
Admission: RE | Admit: 2018-10-24 | Discharge: 2018-10-24 | Disposition: A | Discharge status: Home / Self Care | Payer: Medicare Other | Source: Ambulatory Visit

## 2018-10-24 DIAGNOSIS — Z1231 Encounter for screening mammogram for malignant neoplasm of breast: Secondary | ICD-10-CM

## 2018-10-25 ENCOUNTER — Other Ambulatory Visit: Payer: Self-pay | Admitting: Family Medicine

## 2018-10-25 ENCOUNTER — Other Ambulatory Visit: Payer: Self-pay

## 2018-10-25 DIAGNOSIS — Z1231 Encounter for screening mammogram for malignant neoplasm of breast: Secondary | ICD-10-CM

## 2018-11-28 ENCOUNTER — Other Ambulatory Visit: Payer: Self-pay | Admitting: Family Medicine

## 2018-11-28 DIAGNOSIS — E785 Hyperlipidemia, unspecified: Secondary | ICD-10-CM

## 2018-12-22 ENCOUNTER — Ambulatory Visit (INDEPENDENT_AMBULATORY_CARE_PROVIDER_SITE_OTHER): Payer: Medicare Other | Admitting: Obstetrics and Gynecology

## 2018-12-22 ENCOUNTER — Encounter: Payer: Self-pay | Admitting: Obstetrics and Gynecology

## 2018-12-22 ENCOUNTER — Other Ambulatory Visit: Payer: Self-pay

## 2018-12-22 VITALS — BP 160/76 | HR 80 | Temp 97.2°F | Ht 59.0 in | Wt 150.4 lb

## 2018-12-22 DIAGNOSIS — Z124 Encounter for screening for malignant neoplasm of cervix: Secondary | ICD-10-CM | POA: Diagnosis not present

## 2018-12-22 DIAGNOSIS — M8580 Other specified disorders of bone density and structure, unspecified site: Secondary | ICD-10-CM | POA: Diagnosis not present

## 2018-12-22 DIAGNOSIS — N3946 Mixed incontinence: Secondary | ICD-10-CM | POA: Diagnosis not present

## 2018-12-22 DIAGNOSIS — N814 Uterovaginal prolapse, unspecified: Secondary | ICD-10-CM

## 2018-12-22 DIAGNOSIS — Z01419 Encounter for gynecological examination (general) (routine) without abnormal findings: Secondary | ICD-10-CM | POA: Diagnosis not present

## 2018-12-22 NOTE — Patient Instructions (Signed)
EXERCISE AND DIET:  We recommended that you start or continue a regular exercise program for good health. Regular exercise means any activity that makes your heart beat faster and makes you sweat.  We recommend exercising at least 30 minutes per day at least 3 days a week, preferably 4 or 5.  We also recommend a diet low in fat and sugar.  Inactivity, poor dietary choices and obesity can cause diabetes, heart attack, stroke, and kidney damage, among others.    ALCOHOL AND SMOKING:  Women should limit their alcohol intake to no more than 7 drinks/beers/glasses of wine (combined, not each!) per week. Moderation of alcohol intake to this level decreases your risk of breast cancer and liver damage. And of course, no recreational drugs are part of a healthy lifestyle.  And absolutely no smoking or even second hand smoke. Most people know smoking can cause heart and lung diseases, but did you know it also contributes to weakening of your bones? Aging of your skin?  Yellowing of your teeth and nails?  CALCIUM AND VITAMIN D:  Adequate intake of calcium and Vitamin D are recommended.  The recommendations for exact amounts of these supplements seem to change often, but generally speaking 1,200 mg of calcium (between diet and supplement) and 800 units of Vitamin D per day seems prudent. Certain women may benefit from higher intake of Vitamin D.  If you are among these women, your doctor will have told you during your visit.    PAP SMEARS:  Pap smears, to check for cervical cancer or precancers,  have traditionally been done yearly, although recent scientific advances have shown that most women can have pap smears less often.  However, every woman still should have a physical exam from her gynecologist every year. It will include a breast check, inspection of the vulva and vagina to check for abnormal growths or skin changes, a visual exam of the cervix, and then an exam to evaluate the size and shape of the uterus and  ovaries.  And after 73 years of age, a rectal exam is indicated to check for rectal cancers. We will also provide age appropriate advice regarding health maintenance, like when you should have certain vaccines, screening for sexually transmitted diseases, bone density testing, colonoscopy, mammograms, etc.   MAMMOGRAMS:  All women over 40 years old should have a yearly mammogram. Many facilities now offer a "3D" mammogram, which may cost around $50 extra out of pocket. If possible,  we recommend you accept the option to have the 3D mammogram performed.  It both reduces the number of women who will be called back for extra views which then turn out to be normal, and it is better than the routine mammogram at detecting truly abnormal areas.    COLON CANCER SCREENING: Now recommend starting at age 45. At this time colonoscopy is not covered for routine screening until 50. There are take home tests that can be done between 45-49.   COLONOSCOPY:  Colonoscopy to screen for colon cancer is recommended for all women at age 50.  We know, you hate the idea of the prep.  We agree, BUT, having colon cancer and not knowing it is worse!!  Colon cancer so often starts as a polyp that can be seen and removed at colonscopy, which can quite literally save your life!  And if your first colonoscopy is normal and you have no family history of colon cancer, most women don't have to have it again for   10 years.  Once every ten years, you can do something that may end up saving your life, right?  We will be happy to help you get it scheduled when you are ready.  Be sure to check your insurance coverage so you understand how much it will cost.  It may be covered as a preventative service at no cost, but you should check your particular policy.      Breast Self-Awareness Breast self-awareness means being familiar with how your breasts look and feel. It involves checking your breasts regularly and reporting any changes to your  health care provider. Practicing breast self-awareness is important. A change in your breasts can be a sign of a serious medical problem. Being familiar with how your breasts look and feel allows you to find any problems early, when treatment is more likely to be successful. All women should practice breast self-awareness, including women who have had breast implants. How to do a breast self-exam One way to learn what is normal for your breasts and whether your breasts are changing is to do a breast self-exam. To do a breast self-exam: Look for Changes  1. Remove all the clothing above your waist. 2. Stand in front of a mirror in a room with good lighting. 3. Put your hands on your hips. 4. Push your hands firmly downward. 5. Compare your breasts in the mirror. Look for differences between them (asymmetry), such as: ? Differences in shape. ? Differences in size. ? Puckers, dips, and bumps in one breast and not the other. 6. Look at each breast for changes in your skin, such as: ? Redness. ? Scaly areas. 7. Look for changes in your nipples, such as: ? Discharge. ? Bleeding. ? Dimpling. ? Redness. ? A change in position. Feel for Changes Carefully feel your breasts for lumps and changes. It is best to do this while lying on your back on the floor and again while sitting or standing in the shower or tub with soapy water on your skin. Feel each breast in the following way:  Place the arm on the side of the breast you are examining above your head.  Feel your breast with the other hand.  Start in the nipple area and make  inch (2 cm) overlapping circles to feel your breast. Use the pads of your three middle fingers to do this. Apply light pressure, then medium pressure, then firm pressure. The light pressure will allow you to feel the tissue closest to the skin. The medium pressure will allow you to feel the tissue that is a little deeper. The firm pressure will allow you to feel the tissue  close to the ribs.  Continue the overlapping circles, moving downward over the breast until you feel your ribs below your breast.  Move one finger-width toward the center of the body. Continue to use the  inch (2 cm) overlapping circles to feel your breast as you move slowly up toward your collarbone.  Continue the up and down exam using all three pressures until you reach your armpit.  Write Down What You Find  Write down what is normal for each breast and any changes that you find. Keep a written record with breast changes or normal findings for each breast. By writing this information down, you do not need to depend only on memory for size, tenderness, or location. Write down where you are in your menstrual cycle, if you are still menstruating. If you are having trouble noticing differences   in your breasts, do not get discouraged. With time you will become more familiar with the variations in your breasts and more comfortable with the exam. How often should I examine my breasts? Examine your breasts every month. If you are breastfeeding, the best time to examine your breasts is after a feeding or after using a breast pump. If you menstruate, the best time to examine your breasts is 5-7 days after your period is over. During your period, your breasts are lumpier, and it may be more difficult to notice changes. When should I see my health care provider? See your health care provider if you notice:  A change in shape or size of your breasts or nipples.  A change in the skin of your breast or nipples, such as a reddened or scaly area.  Unusual discharge from your nipples.  A lump or thick area that was not there before.  Pain in your breasts.  Anything that concerns you.  

## 2018-12-22 NOTE — Progress Notes (Signed)
73 y.o. G42P2002 Married White or Caucasian Not Hispanic or Latino female here for annual exam.  H/O grade 2 uterine prolapse, pessary didn't work. Mainly notices it when she wipes or washes. Doesn't notice it when she is sitting, standing or walking. No dyspareunia.  H/O mixed urinary incontinence, no change, tolerable. She c/o some increase in gas. She had C.diff 2-3 years ago, since then she takes a probiotic, BM are mostly normal.       Patient's last menstrual period was 03/23/1998 (lmp unknown).          Sexually active: Yes.    The current method of family planning is post menopausal status.    Exercising: No.  The patient does not participate in regular exercise at present. Smoker:  no  Health Maintenance: Pap:  12/15/2017 WNL NEG HPV, 10/24/2014 WNL negative HPV History of abnormal Pap:  Yes, colpo, negative biopsy MMG:  10/24/2018  BI-RADS CATEGORY 1: Negative. BMD:   01/06/2016 osteopenic, T score -1.1, FRAX 8.8/1 Colonoscopy: 02/28/2018 polyp TDaP:  2011 Gardasil: n/a   reports that she has never smoked. She has never used smokeless tobacco. She reports that she does not drink alcohol or use drugs.  Past Medical History:  Diagnosis Date  . Chronic sinusitis   . Clostridium difficile infection 11/2015  . Elevated cholesterol   . Environmental allergies   . Fibroid   . Leg edema, left   . Low back pain   . Uterine prolapse 2010    Past Surgical History:  Procedure Laterality Date  . TONSILLECTOMY AND ADENOIDECTOMY      Current Outpatient Medications  Medication Sig Dispense Refill  . b complex vitamins tablet Take 1 tablet by mouth daily.    . Biotin 1000 MCG tablet Take 1,000 mcg by mouth daily.    . Calcium Carbonate (CALCIUM 600 PO) Take by mouth daily.    . Cetirizine HCl (ZYRTEC PO) Take by mouth as needed.    . Cholecalciferol (VITAMIN D3) 1000 units CAPS     . Coenzyme Q10 (COQ10) 100 MG CAPS     . fluticasone (FLONASE) 50 MCG/ACT nasal spray daily as  needed.     . Glucosamine 500 MG CAPS Take by mouth.    . Lactobacillus-Inulin (PROBIOTIC DIGESTIVE SUPPORT PO) Take by mouth.    . LUTEIN PO Take 20 mg by mouth daily.    Marland Kitchen omeprazole (PRILOSEC) 20 MG capsule Take 1 capsule by mouth once daily 90 capsule 1  . simvastatin (ZOCOR) 40 MG tablet TAKE 1 TABLET BY MOUTH ONCE DAILY IN THE EVENING 90 tablet 0  . vitamin C (ASCORBIC ACID) 500 MG tablet Take 500 mg by mouth daily.    . vitamin E 400 UNIT capsule Take 400 Units by mouth daily.     No current facility-administered medications for this visit.     Family History  Problem Relation Age of Onset  . Cancer Mother 62       Stomach cancer  . Hypertension Brother   . Diabetes Brother        Type II  . Osteoporosis Maternal Aunt     Review of Systems  Constitutional: Negative.   HENT: Negative.   Eyes: Negative.   Respiratory: Negative.   Cardiovascular: Negative.   Gastrointestinal: Negative.   Endocrine: Negative.   Genitourinary: Negative.   Musculoskeletal: Negative.   Skin: Negative.   Allergic/Immunologic: Negative.   Neurological: Negative.   Hematological: Negative.   Psychiatric/Behavioral: Negative.  Exam:   BP (!) 142/80 (BP Location: Right Arm, Patient Position: Sitting, Cuff Size: Normal)   Pulse 80   Temp (!) 97.2 F (36.2 C) (Skin)   Ht 4\' 11"  (1.499 m)   Wt 150 lb 6.4 oz (68.2 kg)   LMP 03/23/1998 (LMP Unknown)   BMI 30.38 kg/m   Weight change: @WEIGHTCHANGE @ Height:   Height: 4\' 11"  (149.9 cm)  Ht Readings from Last 3 Encounters:  12/22/18 4\' 11"  (1.499 m)  08/25/18 4\' 11"  (1.499 m)  05/13/18 4' 11.5" (1.511 m)    General appearance: alert, cooperative and appears stated age Head: Normocephalic, without obvious abnormality, atraumatic Neck: no adenopathy, supple, symmetrical, trachea midline and thyroid normal to inspection and palpation Lungs: clear to auscultation bilaterally Cardiovascular: regular rate and rhythm Breasts: normal  appearance, no masses or tenderness Abdomen: soft, non-tender; non distended,  no masses,  no organomegaly Extremities: extremities normal, atraumatic, no cyanosis or edema Skin: Skin color, texture, turgor normal. No rashes or lesions Lymph nodes: Cervical, supraclavicular, and axillary nodes normal. No abnormal inguinal nodes palpated Neurologic: Grossly normal   Pelvic: External genitalia:  no lesions              Urethra:  normal appearing urethra with no masses, tenderness or lesions              Bartholins and Skenes: normal                 Vagina: normal appearing vagina with normal color and discharge, no lesions              Cervix: no lesions, elongated  Her cervix extends ~2 cm out of her introitus with valsalva.                Bimanual Exam:  Uterus:  normal size, contour, position, consistency, mobility, non-tender              Adnexa: no mass, fullness, tenderness               Rectovaginal: Confirms               Anus:  normal sphincter tone, no lesions  Chaperone was present for exam.  A:  Well Woman with normal exam  Uterine prolapse, tolerable. Grade 2 (almost grade 3 with valsalva)  Mixed incontinence, stable  Mildly elevated BP  P:   No pap this year  Mammogram UTD  DEXA in 2 years  Colonoscopy UTD  Labs with primary  Discussed breast self exam  Discussed calcium and vit D intake  Avoid heavy lifting and straining, she will call if the prolapse is bothering her  Repeat BP, if still elevated will have her f/u with her primary

## 2018-12-28 ENCOUNTER — Other Ambulatory Visit: Payer: Self-pay

## 2018-12-28 ENCOUNTER — Encounter: Payer: Self-pay | Admitting: Family Medicine

## 2018-12-28 ENCOUNTER — Ambulatory Visit (INDEPENDENT_AMBULATORY_CARE_PROVIDER_SITE_OTHER): Payer: Medicare Other | Admitting: Family Medicine

## 2018-12-28 VITALS — BP 142/72 | HR 78 | Temp 97.0°F | Resp 16 | Ht 59.0 in | Wt 149.0 lb

## 2018-12-28 DIAGNOSIS — Z23 Encounter for immunization: Secondary | ICD-10-CM | POA: Diagnosis not present

## 2018-12-28 DIAGNOSIS — R030 Elevated blood-pressure reading, without diagnosis of hypertension: Secondary | ICD-10-CM

## 2018-12-28 NOTE — Patient Instructions (Addendum)
Follow up as needed or as scheduled Spot check your BP and let me know if it is running higher than 140/90 I feel that the numbers at GYN are outliers- the rest of the BPs look great!!! Keep up the good work!  You look great!!! Call with any questions or concerns Stay Safe!!!

## 2018-12-28 NOTE — Progress Notes (Signed)
   Subjective:    Patient ID: Heather Gallegos, female    DOB: 1945/10/06, 73 y.o.   MRN: [id number]  HPI Elevated BP- at GYN SBP was 142 and then on recheck 160.  BP was taken manually.  Denies any sxs- HAs, visual changes, dizziness, ringing in the ears, CP, SOB, edema.  Pt thinks father had HTN.  Pt has not had hx of elevated BPs.     Review of Systems For ROS see HPI     Objective:   Physical Exam Vitals signs reviewed.  Constitutional:      General: She is not in acute distress.    Appearance: Normal appearance. She is well-developed.  HENT:     Head: Normocephalic and atraumatic.  Eyes:     Conjunctiva/sclera: Conjunctivae normal.     Pupils: Pupils are equal, round, and reactive to light.  Neck:     Musculoskeletal: Normal range of motion and neck supple.     Thyroid: No thyromegaly.  Cardiovascular:     Rate and Rhythm: Normal rate and regular rhythm.     Heart sounds: Normal heart sounds. No murmur.  Pulmonary:     Effort: Pulmonary effort is normal. No respiratory distress.     Breath sounds: Normal breath sounds.  Abdominal:     General: There is no distension.     Palpations: Abdomen is soft.     Tenderness: There is no abdominal tenderness.  Lymphadenopathy:     Cervical: No cervical adenopathy.  Skin:    General: Skin is warm and dry.  Neurological:     Mental Status: She is alert and oriented to person, place, and time.  Psychiatric:        Behavior: Behavior normal.           Assessment & Plan:  Elevated BP- new.  Reviewed 3 yrs of BPs and the only outliers have been her yearly GYN appts.  Remainder of BP readings were 120s-140.  Pt is asymptomatic.  Given her typically normal readings, I do not want to start her on BP med which will lower her even further and may cause dizziness, fatigue, syncope.  Pt will monitor BP readings at home and notify me if consistently >140/90.  Pt expressed understanding and is in agreement w/ plan.

## 2019-02-17 ENCOUNTER — Other Ambulatory Visit: Payer: Self-pay | Admitting: Family Medicine

## 2019-02-28 ENCOUNTER — Other Ambulatory Visit: Payer: Self-pay | Admitting: Family Medicine

## 2019-02-28 DIAGNOSIS — E785 Hyperlipidemia, unspecified: Secondary | ICD-10-CM

## 2019-03-02 ENCOUNTER — Ambulatory Visit: Payer: Medicare Other | Admitting: Family Medicine

## 2019-05-23 ENCOUNTER — Other Ambulatory Visit: Payer: Self-pay | Admitting: Family Medicine

## 2019-05-23 DIAGNOSIS — E785 Hyperlipidemia, unspecified: Secondary | ICD-10-CM

## 2019-07-24 ENCOUNTER — Telehealth: Payer: Self-pay | Admitting: Family Medicine

## 2019-07-24 NOTE — Telephone Encounter (Signed)
Pt called stating she has been experiencing chest pain since Saturday. Contacted [facility] and got pt triaged.

## 2019-08-28 ENCOUNTER — Other Ambulatory Visit: Payer: Self-pay | Admitting: Family Medicine

## 2019-08-28 DIAGNOSIS — E785 Hyperlipidemia, unspecified: Secondary | ICD-10-CM

## 2019-09-13 ENCOUNTER — Ambulatory Visit (INDEPENDENT_AMBULATORY_CARE_PROVIDER_SITE_OTHER): Payer: Medicare PPO | Admitting: Family Medicine

## 2019-09-13 ENCOUNTER — Other Ambulatory Visit: Payer: Self-pay

## 2019-09-13 ENCOUNTER — Encounter: Payer: Self-pay | Admitting: Family Medicine

## 2019-09-13 VITALS — BP 123/82 | HR 78 | Temp 98.0°F | Resp 16 | Ht 59.0 in | Wt 152.2 lb

## 2019-09-13 DIAGNOSIS — E663 Overweight: Secondary | ICD-10-CM | POA: Insufficient documentation

## 2019-09-13 DIAGNOSIS — Z0000 Encounter for general adult medical examination without abnormal findings: Secondary | ICD-10-CM

## 2019-09-13 DIAGNOSIS — E785 Hyperlipidemia, unspecified: Secondary | ICD-10-CM

## 2019-09-13 DIAGNOSIS — Z8639 Personal history of other endocrine, nutritional and metabolic disease: Secondary | ICD-10-CM

## 2019-09-13 DIAGNOSIS — M8580 Other specified disorders of bone density and structure, unspecified site: Secondary | ICD-10-CM

## 2019-09-13 DIAGNOSIS — E669 Obesity, unspecified: Secondary | ICD-10-CM

## 2019-09-13 DIAGNOSIS — M85852 Other specified disorders of bone density and structure, left thigh: Secondary | ICD-10-CM

## 2019-09-13 DIAGNOSIS — E119 Type 2 diabetes mellitus without complications: Secondary | ICD-10-CM | POA: Insufficient documentation

## 2019-09-13 LAB — BASIC METABOLIC PANEL
BUN: 25 mg/dL — ABNORMAL HIGH (ref 6–23)
CO2: 29 mEq/L (ref 19–32)
Calcium: 9.6 mg/dL (ref 8.4–10.5)
Chloride: 105 mEq/L (ref 96–112)
Creatinine, Ser: 0.95 mg/dL (ref 0.40–1.20)
GFR: 57.48 mL/min — ABNORMAL LOW (ref >60.00)
Glucose, Bld: 94 mg/dL (ref 70–99)
Potassium: 5.2 mEq/L — ABNORMAL HIGH (ref 3.5–5.1)
Sodium: 139 mEq/L (ref 135–145)

## 2019-09-13 LAB — CBC WITH DIFFERENTIAL/PLATELET
Basophils Absolute: 0 10*3/uL (ref 0.0–0.1)
Basophils Relative: 0.6 % (ref 0.0–3.0)
Eosinophils Absolute: 0.2 10*3/uL (ref 0.0–0.7)
Eosinophils Relative: 2.8 % (ref 0.0–5.0)
HCT: 39.6 % (ref 36.0–46.0)
Hemoglobin: 13.5 g/dL (ref 12.0–15.0)
Lymphocytes Relative: 28.3 % (ref 12.0–46.0)
Lymphs Abs: 1.7 10*3/uL (ref 0.7–4.0)
MCHC: 34 g/dL (ref 30.0–36.0)
MCV: 88.5 fl (ref 78.0–100.0)
Monocytes Absolute: 0.5 10*3/uL (ref 0.1–1.0)
Monocytes Relative: 8.2 % (ref 3.0–12.0)
Neutro Abs: 3.7 10*3/uL (ref 1.4–7.7)
Neutrophils Relative %: 60.1 % (ref 43.0–77.0)
Platelets: 251 10*3/uL (ref 150.0–400.0)
RBC: 4.47 Mil/uL (ref 3.87–5.11)
RDW: 13.5 % (ref 11.5–15.5)
WBC: 6.1 10*3/uL (ref 4.0–10.5)

## 2019-09-13 LAB — HEPATIC FUNCTION PANEL
ALT: 18 U/L (ref 0–35)
AST: 18 U/L (ref 0–37)
Albumin: 4.2 g/dL (ref 3.5–5.2)
Alkaline Phosphatase: 57 U/L (ref 39–117)
Bilirubin, Direct: 0.1 mg/dL (ref 0.0–0.3)
Total Bilirubin: 0.3 mg/dL (ref 0.2–1.2)
Total Protein: 6.3 g/dL (ref 6.0–8.3)

## 2019-09-13 LAB — HEMOGLOBIN A1C: Hgb A1c MFr Bld: 6.6 % — ABNORMAL HIGH (ref 4.6–6.5)

## 2019-09-13 LAB — LIPID PANEL
Cholesterol: 137 mg/dL (ref 0–200)
HDL: 39 mg/dL — ABNORMAL LOW (ref >39.00)
NonHDL: 97.73
Total CHOL/HDL Ratio: 4
Triglycerides: 221 mg/dL — ABNORMAL HIGH (ref 0.0–149.0)
VLDL: 44.2 mg/dL — ABNORMAL HIGH (ref 0.0–40.0)

## 2019-09-13 LAB — LDL CHOLESTEROL, DIRECT: Direct LDL: 73 mg/dL

## 2019-09-13 NOTE — Assessment & Plan Note (Signed)
Last A1C was WNL but prior to that had been in the diabetes range.  Has never been on medication and has been able to control w/ lifestyle.  Will continue to follow closely as this can change.

## 2019-09-13 NOTE — Assessment & Plan Note (Signed)
Due for repeat DEXA and Vit D levels.  Both ordred.  Encouraged weight bearing exercise and she is to continue to take her calcium and Vit D.

## 2019-09-13 NOTE — Progress Notes (Signed)
   Subjective:    Patient ID: Heather Gallegos, female    DOB: Oct 10, 1945, 74 y.o.   MRN: [id number]  HPI Here today for MWV.  Risk Factors: Hyperlipidemia- chronic problem, on Simvastatin 40mg  daily.  No CP, SOB, abd pain, N/V. Obesity- pt has gained 3 labs since last visit.  BMI now 30.75.  No regular exercise. Hx of diabetes- last A1C 6.5.  Previously 6.6.  Not on medication.  No numbness/tingling of hands/feet. Osteopenia- due for DEXA and Vit D. Physical Activity: no regular exercise Fall Risk: Low Depression: Denies Hearing: Normal to conversational tones and whispered voice at 6 ft ADL's: Independent Cognitive: Normal linear thought process, memory and attention intact Home Safety: Safe at home Height, Weight, BMI, Visual Acuity: see vitals, vision corrected to 20/20 w/ glasses Counseling: UTD on colonoscopy, mammo, immunizations Labs Ordered: See A&P Care Plan: See A&P   Reviewed past medical, surgical, family and social histories.   Patient Care Team    Relationship Specialty Notifications Start End  Midge Minium, MD PCP - General Family Medicine  08/29/15   Juanita Craver, MD Consulting Physician Gastroenterology  08/25/17   Salvadore Dom, MD Consulting Physician Obstetrics and Gynecology  08/25/17     Health Maintenance  Topic Date Due  . INFLUENZA VACCINE  10/22/2019  . MAMMOGRAM  10/24/2019  . TETANUS/TDAP  12/24/2019  . COLONOSCOPY  03/01/2023  . DEXA SCAN  Completed  . COVID-19 Vaccine  Completed  . Hepatitis C Screening  Completed  . PNA vac Low Risk Adult  Completed      Review of Systems For ROS see HPI   This visit occurred during the SARS-CoV-2 public health emergency.  Safety protocols were in place, including screening questions prior to the visit, additional usage of staff PPE, and extensive cleaning of exam room while observing appropriate contact time as indicated for disinfecting solutions.       Objective:   Physical Exam Vitals  reviewed.  Constitutional:      General: She is not in acute distress.    Appearance: She is well-developed. She is obese.  HENT:     Head: Normocephalic and atraumatic.     Right Ear: Tympanic membrane and external ear normal.     Left Ear: Tympanic membrane and external ear normal.  Eyes:     Extraocular Movements: Extraocular movements intact.     Conjunctiva/sclera: Conjunctivae normal.     Pupils: Pupils are equal, round, and reactive to light.  Neck:     Thyroid: No thyromegaly.  Cardiovascular:     Rate and Rhythm: Normal rate and regular rhythm.     Heart sounds: Normal heart sounds. No murmur heard.   Pulmonary:     Effort: Pulmonary effort is normal. No respiratory distress.     Breath sounds: Normal breath sounds.  Abdominal:     General: There is no distension.     Palpations: Abdomen is soft.     Tenderness: There is no abdominal tenderness.  Musculoskeletal:     Cervical back: Normal range of motion and neck supple.  Lymphadenopathy:     Cervical: No cervical adenopathy.  Skin:    General: Skin is warm and dry.  Neurological:     Mental Status: She is alert and oriented to person, place, and time.  Psychiatric:        Behavior: Behavior normal.           Assessment & Plan:

## 2019-09-13 NOTE — Assessment & Plan Note (Signed)
Ongoing issue for pt.  Stressed need for healthy diet and regular exercise.  Check labs to risk stratify.  Will follow 

## 2019-09-13 NOTE — Patient Instructions (Signed)
Follow up in 6 months to recheck cholesterol We'll notify you of your lab results and make any changes if needed Continue to work on healthy diet and regular exercise- you can do it! Ask your brother whether he had carotid artery screening, aortic aneurysm screening, or other We'll call you with your bone density appt Call with any questions or concerns HAVE A WONDERFUL TRIP!!!   Preventive Care 65 Years and Older, Female Preventive care refers to lifestyle choices and visits with your health care provider that can promote health and wellness. This includes:  A yearly physical exam. This is also called an annual well check.  Regular dental and eye exams.  Immunizations.  Screening for certain conditions.  Healthy lifestyle choices, such as diet and exercise. What can I expect for my preventive care visit? Physical exam Your health care provider will check:  Height and weight. These may be used to calculate body mass index (BMI), which is a measurement that tells if you are at a healthy weight.  Heart rate and blood pressure.  Your skin for abnormal spots. Counseling Your health care provider may ask you questions about:  Alcohol, tobacco, and drug use.  Emotional well-being.  Home and relationship well-being.  Sexual activity.  Eating habits.  History of falls.  Memory and ability to understand (cognition).  Work and work environment.  Pregnancy and menstrual history. What immunizations do I need?  Influenza (flu) vaccine  This is recommended every year. Tetanus, diphtheria, and pertussis (Tdap) vaccine  You may need a Td booster every 10 years. Varicella (chickenpox) vaccine  You may need this vaccine if you have not already been vaccinated. Zoster (shingles) vaccine  You may need this after age 60. Pneumococcal conjugate (PCV13) vaccine  One dose is recommended after age 74. Pneumococcal polysaccharide (PPSV23) vaccine  One dose is recommended  after age 74. Measles, mumps, and rubella (MMR) vaccine  You may need at least one dose of MMR if you were born in 1957 or later. You may also need a second dose. Meningococcal conjugate (MenACWY) vaccine  You may need this if you have certain conditions. Hepatitis A vaccine  You may need this if you have certain conditions or if you travel or work in places where you may be exposed to hepatitis A. Hepatitis B vaccine  You may need this if you have certain conditions or if you travel or work in places where you may be exposed to hepatitis B. Haemophilus influenzae type b (Hib) vaccine  You may need this if you have certain conditions. You may receive vaccines as individual doses or as more than one vaccine together in one shot (combination vaccines). Talk with your health care provider about the risks and benefits of combination vaccines. What tests do I need? Blood tests  Lipid and cholesterol levels. These may be checked every 5 years, or more frequently depending on your overall health.  Hepatitis C test.  Hepatitis B test. Screening  Lung cancer screening. You may have this screening every year starting at age 55 if you have a 30-pack-year history of smoking and currently smoke or have quit within the past 15 years.  Colorectal cancer screening. All adults should have this screening starting at age 50 and continuing until age 75. Your health care provider may recommend screening at age 45 if you are at increased risk. You will have tests every 1-10 years, depending on your results and the type of screening test.  Diabetes screening. This is   done by checking your blood sugar (glucose) after you have not eaten for a while (fasting). You may have this done every 1-3 years.  Mammogram. This may be done every 1-2 years. Talk with your health care provider about how often you should have regular mammograms.  BRCA-related cancer screening. This may be done if you have a family history  of breast, ovarian, tubal, or peritoneal cancers. Other tests  Sexually transmitted disease (STD) testing.  Bone density scan. This is done to screen for osteoporosis. You may have this done starting at age 74. Follow these instructions at home: Eating and drinking  Eat a diet that includes fresh fruits and vegetables, whole grains, lean protein, and low-fat dairy products. Limit your intake of foods with high amounts of sugar, saturated fats, and salt.  Take vitamin and mineral supplements as recommended by your health care provider.  Do not drink alcohol if your health care provider tells you not to drink.  If you drink alcohol: ? Limit how much you have to 0-1 drink a day. ? Be aware of how much alcohol is in your drink. In the [place] one drink equals one 12 oz bottle of beer (355 mL), one 5 oz glass of wine (148 mL), or one 1 oz glass of hard liquor (44 mL). Lifestyle  Take daily care of your teeth and gums.  Stay active. Exercise for at least 30 minutes on 5 or more days each week.  Do not use any products that contain nicotine or tobacco, such as cigarettes, e-cigarettes, and chewing tobacco. If you need help quitting, ask your health care provider.  If you are sexually active, practice safe sex. Use a condom or other form of protection in order to prevent STIs (sexually transmitted infections).  Talk with your health care provider about taking a low-dose aspirin or statin. What's next?  Go to your health care provider once a year for a well check visit.  Ask your health care provider how often you should have your eyes and teeth checked.  Stay up to date on all vaccines. This information is not intended to replace advice given to you by your health care provider. Make sure you discuss any questions you have with your health care provider. Document Revised: 03/03/2018 Document Reviewed: 03/03/2018 Elsevier Patient Education  2020 [employer]  

## 2019-09-13 NOTE — Assessment & Plan Note (Signed)
Chronic problem.  Tolerating statin w/o difficulty.  Encouraged healthy diet and regular exercise.  Check labs.  Adjust meds prn. 

## 2019-09-14 ENCOUNTER — Encounter: Payer: Self-pay | Admitting: General Practice

## 2019-09-14 LAB — VITAMIN D 25 HYDROXY (VIT D DEFICIENCY, FRACTURES): VITD: 96.77 ng/mL (ref 30.00–100.00)

## 2019-09-14 LAB — TSH: TSH: 2.86 u[IU]/mL (ref 0.35–4.50)

## 2019-09-21 ENCOUNTER — Other Ambulatory Visit: Payer: Self-pay | Admitting: Obstetrics and Gynecology

## 2019-09-21 DIAGNOSIS — Z1231 Encounter for screening mammogram for malignant neoplasm of breast: Secondary | ICD-10-CM

## 2019-10-04 ENCOUNTER — Encounter: Payer: Self-pay | Admitting: Family Medicine

## 2019-10-04 ENCOUNTER — Other Ambulatory Visit: Payer: Self-pay

## 2019-10-04 ENCOUNTER — Ambulatory Visit (INDEPENDENT_AMBULATORY_CARE_PROVIDER_SITE_OTHER): Payer: Medicare PPO | Admitting: Family Medicine

## 2019-10-04 VITALS — BP 123/71 | HR 79 | Temp 97.8°F | Resp 16 | Ht 59.0 in | Wt 151.4 lb

## 2019-10-04 DIAGNOSIS — R300 Dysuria: Secondary | ICD-10-CM

## 2019-10-04 LAB — POCT URINALYSIS DIPSTICK
Bilirubin, UA: NEGATIVE
Blood, UA: NEGATIVE
Glucose, UA: NEGATIVE
Ketones, UA: NEGATIVE
Leukocytes, UA: NEGATIVE
Nitrite, UA: NEGATIVE
Protein, UA: NEGATIVE
Spec Grav, UA: 1.02 (ref 1.010–1.025)
Urobilinogen, UA: 0.2 E.U./dL
pH, UA: 6 (ref 5.0–8.0)

## 2019-10-04 MED ORDER — CEPHALEXIN 500 MG PO CAPS: 500.0000 mg | ORAL_CAPSULE | Freq: Two times a day (BID) | ORAL | 0 refills | Status: AC

## 2019-10-04 NOTE — Patient Instructions (Signed)
Follow up as needed or as scheduled We'll notify you of your urine culture results once it's available START the Cephalexin (Keflex) twice daily- take w/ food Drink plenty of fluids Call with any questions or concerns Have a great summer!

## 2019-10-04 NOTE — Progress Notes (Signed)
° °  Subjective:    Patient ID: Heather Gallegos, female    DOB: 1945/10/12, 74 y.o.   MRN: [id number]  HPI Dysuria- 'it felt like a urinary tract thing'.  sxs started on Saturday w/ burning.  sxs are worse in the AM when urine is more concentrated.  Some urgency and frequency.  Some incomplete emptying.  No fevers/chills.  No N/V.  No back pain.  No suprapubic pain.  Burning has not changed in intensity since onset.   Review of Systems For ROS see HPI   This visit occurred during the SARS-CoV-2 public health emergency.  Safety protocols were in place, including screening questions prior to the visit, additional usage of staff PPE, and extensive cleaning of exam room while observing appropriate contact time as indicated for disinfecting solutions.        Objective:   Physical Exam Vitals reviewed.  Constitutional:      General: She is not in acute distress.    Appearance: Normal appearance. She is well-developed.  Abdominal:     General: There is no distension.     Palpations: Abdomen is soft.     Tenderness: There is no abdominal tenderness (no suprapubic or CVA tenderness).  Skin:    General: Skin is warm and dry.  Neurological:     General: No focal deficit present.     Mental Status: She is alert and oriented to person, place, and time.  Psychiatric:        Mood and Affect: Mood normal.        Behavior: Behavior normal.        Thought Content: Thought content normal.           Assessment & Plan:  Dysuria- new.  Pt's sxs are suspicious for UTI.  UA unremarkable but will send for culture and treat empirically for UTI w/ Keflex.  Pt expressed understanding and is in agreement w/ plan.

## 2019-10-06 LAB — URINE CULTURE
MICRO NUMBER:: 10704421
SPECIMEN QUALITY:: ADEQUATE

## 2019-10-26 ENCOUNTER — Ambulatory Visit
Admission: RE | Admit: 2019-10-26 | Discharge: 2019-10-26 | Disposition: A | Discharge status: Home / Self Care | Payer: Medicare PPO | Source: Ambulatory Visit | Attending: Obstetrics and Gynecology | Admitting: Obstetrics and Gynecology

## 2019-10-26 ENCOUNTER — Other Ambulatory Visit: Payer: Self-pay

## 2019-10-26 DIAGNOSIS — Z1231 Encounter for screening mammogram for malignant neoplasm of breast: Secondary | ICD-10-CM | POA: Diagnosis not present

## 2019-11-14 ENCOUNTER — Other Ambulatory Visit: Payer: Self-pay

## 2019-11-14 ENCOUNTER — Encounter: Payer: Self-pay | Admitting: General Practice

## 2019-11-14 ENCOUNTER — Encounter: Payer: Self-pay | Admitting: Family Medicine

## 2019-11-14 ENCOUNTER — Ambulatory Visit
Admission: RE | Admit: 2019-11-14 | Discharge: 2019-11-14 | Disposition: A | Discharge status: Home / Self Care | Payer: Medicare PPO | Source: Ambulatory Visit | Attending: Family Medicine | Admitting: Family Medicine

## 2019-11-14 DIAGNOSIS — M85852 Other specified disorders of bone density and structure, left thigh: Secondary | ICD-10-CM

## 2019-11-14 DIAGNOSIS — Z780 Asymptomatic menopausal state: Secondary | ICD-10-CM | POA: Diagnosis not present

## 2019-11-14 DIAGNOSIS — M8580 Other specified disorders of bone density and structure, unspecified site: Secondary | ICD-10-CM

## 2019-11-25 ENCOUNTER — Other Ambulatory Visit: Payer: Self-pay | Admitting: Family Medicine

## 2019-11-25 DIAGNOSIS — E785 Hyperlipidemia, unspecified: Secondary | ICD-10-CM

## 2019-12-28 ENCOUNTER — Ambulatory Visit: Payer: Medicare Other | Admitting: Obstetrics and Gynecology

## 2020-02-01 ENCOUNTER — Encounter: Payer: Self-pay | Admitting: Family Medicine

## 2020-02-01 ENCOUNTER — Telehealth (INDEPENDENT_AMBULATORY_CARE_PROVIDER_SITE_OTHER): Payer: Medicare PPO | Admitting: Family Medicine

## 2020-02-01 DIAGNOSIS — Z20822 Contact with and (suspected) exposure to covid-19: Secondary | ICD-10-CM

## 2020-02-01 DIAGNOSIS — J329 Chronic sinusitis, unspecified: Secondary | ICD-10-CM | POA: Diagnosis not present

## 2020-02-01 DIAGNOSIS — B9689 Other specified bacterial agents as the cause of diseases classified elsewhere: Secondary | ICD-10-CM | POA: Diagnosis not present

## 2020-02-01 MED ORDER — AMOXICILLIN 875 MG PO TABS: 875.0000 mg | ORAL_TABLET | Freq: Two times a day (BID) | ORAL | 0 refills | Status: DC

## 2020-02-01 NOTE — Progress Notes (Signed)
I connected with  Heather Gallegos on 02/01/20 by a video enabled telemedicine application and verified that I am speaking with the correct person using two identifiers.   I discussed the limitations of evaluation and management by telemedicine. The patient expressed understanding and agreed to proceed.

## 2020-02-01 NOTE — Progress Notes (Signed)
Virtual Visit via Video   I connected with patient on 02/01/20 at  2:30 PM EST by a video enabled telemedicine application and verified that I am speaking with the correct person using two identifiers.  Location patient: Home Location provider: [facility] Persons participating in the virtual visit: Patient, Provider, [place] (Sabrina M)  I discussed the limitations of evaluation and management by telemedicine and the availability of in person appointments. The patient expressed understanding and agreed to proceed.  Subjective:   HPI:   URI- sxs started last week w/ head congestion, sinus pressure, drainage.  Yesterday developed a hacking, dry cough.  Tm 100 last night.  Pt is going to get a home COVID test this afternoon.  + body aches.  Denies chills.  Had diarrhea late last week but this has resolved.  No N/V.  No known sick contacts.  Denies SOB.  HA is frontal.  ROS:   See pertinent positives and negatives per HPI.  Patient Active Problem List   Diagnosis Date Noted  . Obesity (BMI 30-39.9) 09/13/2019  . Osteopenia 09/13/2019  . History of diabetes mellitus, type II 09/13/2019  . Indigestion 02/25/2018  . Physical exam 08/21/2016  . Change of skin color 09/05/2015  . Localized swelling of lower leg 08/28/2015  . Hyperlipidemia   . Uterine prolapse     Social History   Tobacco Use  . Smoking status: Never Smoker  . Smokeless tobacco: Never Used  Substance Use Topics  . Alcohol use: No    Alcohol/week: 0.0 standard drinks    Current Outpatient Medications:  .  b complex vitamins tablet, Take 1 tablet by mouth daily., Disp: , Rfl:  .  Biotin 1000 MCG tablet, Take 1,000 mcg by mouth daily., Disp: , Rfl:  .  Calcium Carbonate (CALCIUM 600 PO), Take by mouth daily., Disp: , Rfl:  .  Cetirizine HCl (ZYRTEC PO), Take by mouth as needed., Disp: , Rfl:  .  Cholecalciferol (VITAMIN D3) 1000 units CAPS, , Disp: , Rfl:  .  Coenzyme Q10 (COQ10) 100 MG CAPS, ,  Disp: , Rfl:  .  fluticasone (FLONASE) 50 MCG/ACT nasal spray, daily as needed. , Disp: , Rfl:  .  Glucosamine 500 MG CAPS, Take by mouth., Disp: , Rfl:  .  Lactobacillus-Inulin (PROBIOTIC DIGESTIVE SUPPORT PO), Take by mouth., Disp: , Rfl:  .  LUTEIN PO, Take 20 mg by mouth daily., Disp: , Rfl:  .  omeprazole (PRILOSEC) 20 MG capsule, Take 1 capsule by mouth once daily, Disp: 90 capsule, Rfl: 0 .  simvastatin (ZOCOR) 40 MG tablet, TAKE 1 TABLET BY MOUTH ONCE DAILY IN THE EVENING, Disp: 90 tablet, Rfl: 0 .  vitamin C (ASCORBIC ACID) 500 MG tablet, Take 500 mg by mouth daily., Disp: , Rfl:  .  vitamin E 400 UNIT capsule, Take 400 Units by mouth daily., Disp: , Rfl:   No Known Allergies  Objective:   LMP 03/23/1998 (LMP Unknown)   AAOx3, NAD NCAT, EOMI No obvious CN deficits Coloring WNL Pt is able to speak clearly, coherently without shortness of breath or increased work of breathing.  Thought process is linear.  Mood is appropriate.   Assessment and Plan:   Bacterial sinusitis- pt has hx of similar.  She states this initially felt like a sinus issue but she has since developed low grade fever, cough, body aches, diarrhea.  Tx possible sinus infxn w/ Amoxicillin and discussed possibility of COVID and need for testing.  Suspected  COVID- pt's sxs are concerning for possible COVID infxn.  Encouraged her to get a test ASAP.  She says the soonest test she can schedule is Sunday but she is going to get a home test to see what it says.  Instructed her that her family needs to avoid gatherings or interacting with people given her sxs.  Encouraged her to let me know her test result ASAP so we can get her antibody infusion if needed.  Pt expressed understanding and is in agreement w/ plan.   Annye Asa, MD 02/01/2020

## 2020-02-07 ENCOUNTER — Telehealth: Payer: Medicare PPO | Admitting: Family Medicine

## 2020-02-19 ENCOUNTER — Other Ambulatory Visit: Payer: Self-pay | Admitting: Family Medicine

## 2020-02-19 DIAGNOSIS — E785 Hyperlipidemia, unspecified: Secondary | ICD-10-CM

## 2020-02-19 MED ORDER — SIMVASTATIN 40 MG PO TABS: 40.0000 mg | ORAL_TABLET | Freq: Every evening | ORAL | 0 refills | Status: DC

## 2020-02-19 MED ORDER — OMEPRAZOLE 20 MG PO CPDR: 20.0000 mg | DELAYED_RELEASE_CAPSULE | Freq: Every day | ORAL | 1 refills | Status: DC

## 2020-04-20 ENCOUNTER — Emergency Department (INDEPENDENT_AMBULATORY_CARE_PROVIDER_SITE_OTHER): Payer: Medicare PPO

## 2020-04-20 ENCOUNTER — Emergency Department (INDEPENDENT_AMBULATORY_CARE_PROVIDER_SITE_OTHER)
Admission: EM | Admit: 2020-04-20 | Discharge: 2020-04-20 | Disposition: A | Discharge status: Home / Self Care | Payer: Medicare PPO | Source: Home / Self Care

## 2020-04-20 ENCOUNTER — Other Ambulatory Visit: Payer: Self-pay

## 2020-04-20 DIAGNOSIS — R319 Hematuria, unspecified: Secondary | ICD-10-CM | POA: Diagnosis not present

## 2020-04-20 DIAGNOSIS — K5732 Diverticulitis of large intestine without perforation or abscess without bleeding: Secondary | ICD-10-CM

## 2020-04-20 DIAGNOSIS — R109 Unspecified abdominal pain: Secondary | ICD-10-CM | POA: Diagnosis not present

## 2020-04-20 DIAGNOSIS — R1032 Left lower quadrant pain: Secondary | ICD-10-CM

## 2020-04-20 HISTORY — DX: Pure hypercholesterolemia, unspecified: E78.00

## 2020-04-20 HISTORY — DX: Gastro-esophageal reflux disease without esophagitis: K21.9

## 2020-04-20 LAB — POCT URINALYSIS DIP (MANUAL ENTRY)
Bilirubin, UA: NEGATIVE
Glucose, UA: NEGATIVE mg/dL
Leukocytes, UA: NEGATIVE
Nitrite, UA: NEGATIVE
Protein Ur, POC: NEGATIVE mg/dL
Spec Grav, UA: 1.02 (ref 1.010–1.025)
Urobilinogen, UA: 0.2 E.U./dL
pH, UA: 5 (ref 5.0–8.0)

## 2020-04-20 MED ORDER — DICYCLOMINE HCL 10 MG PO CAPS: ORAL_CAPSULE | ORAL | 0 refills | Status: DC

## 2020-04-20 MED ORDER — METRONIDAZOLE 500 MG PO TABS: 500.0000 mg | ORAL_TABLET | Freq: Two times a day (BID) | ORAL | 0 refills | Status: AC

## 2020-04-20 NOTE — ED Provider Notes (Signed)
[facility]    CSN: [id number] Arrival date & time: 04/20/20  1000      History   Chief Complaint Chief Complaint  Patient presents with  . Abdominal Pain    LLQ    HPI Heather Gallegos is a 75 y.o. female.   HPI  Patient presents today with 2 days of persistent left upper quadrant abdominal cramping.  She reports the initial onset of pain developed after a meal she had 2 days ago.  She reports some persistent cramping similar to that of a muscle spasm the pain subsided and then abruptly recurred and she took Gas-X which seem to alleviate pain temporarily.  Throughout the night on day 1 she was awakened multiple times due to the degree of the pain.  Yesterday she attempted to eat and although did not experience any nausea felt that food precipitated left lower quadrant pain.  Today she was awakened due to the pain and took a Gas-X prior to arrival and reports that pain has subsided.  Pain at its worse has gotten up to a level 10/10.  She denies any history of diverticulitis, knowledge of diverticulosis, colitis.  She endorses that she is up-to-date with colon cancer screenings. Past Medical History:  Diagnosis Date  . Chronic sinusitis   . Clostridium difficile infection 11/2015  . Elevated cholesterol   . Environmental allergies   . Fibroid   . GERD (gastroesophageal reflux disease)   . Hypercholesteremia   . Leg edema, left   . Low back pain   . Uterine prolapse 2010    Patient Active Problem List   Diagnosis Date Noted  . Obesity (BMI 30-39.9) 09/13/2019  . Osteopenia 09/13/2019  . History of diabetes mellitus, type II 09/13/2019  . Indigestion 02/25/2018  . Physical exam 08/21/2016  . Change of skin color 09/05/2015  . Localized swelling of lower leg 08/28/2015  . Hyperlipidemia   . Uterine prolapse     Past Surgical History:  Procedure Laterality Date  . TONSILLECTOMY AND ADENOIDECTOMY      OB History    Gravida  2   Para  2   Term  2    Preterm  0   AB  0   Living  2     SAB      IAB      Ectopic      Multiple      Live Births  2            Home Medications    Prior to Admission medications   Medication Sig Start Date End Date Taking? Authorizing Provider  amoxicillin (AMOXIL) 875 MG tablet Take 1 tablet (875 mg total) by mouth 2 (two) times daily. 02/01/20   Midge Minium, MD  b complex vitamins tablet Take 1 tablet by mouth daily.    [provider]  Biotin 1000 MCG tablet Take 1,000 mcg by mouth daily.    [provider]  Calcium Carbonate (CALCIUM 600 PO) Take by mouth daily.    [provider]  Cetirizine HCl (ZYRTEC PO) Take by mouth as needed.    [provider]  Cholecalciferol (VITAMIN D3) 1000 units CAPS  07/26/17   [provider]  Coenzyme Q10 (COQ10) 100 MG CAPS  06/21/17   [provider]  fluticasone (FLONASE) 50 MCG/ACT nasal spray daily as needed.  12/23/15   [provider]  Glucosamine 500 MG CAPS Take by mouth.    [provider]  Lactobacillus-Inulin (PROBIOTIC DIGESTIVE SUPPORT PO) Take by mouth.    [provider]  LUTEIN PO Take 20 mg by mouth daily.    [provider]  omeprazole (PRILOSEC) 20 MG capsule Take 1 capsule (20 mg total) by mouth daily. 02/19/20   Midge Minium, MD  simvastatin (ZOCOR) 40 MG tablet Take 1 tablet (40 mg total) by mouth every evening. Please schedule a cholesterol follow up in Dec. 02/19/20   Midge Minium, MD  vitamin C (ASCORBIC ACID) 500 MG tablet Take 500 mg by mouth daily.    [provider]  vitamin E 400 UNIT capsule Take 400 Units by mouth daily.    [provider]    Family History Family History  Problem Relation Age of Onset  . Cancer Mother 27       Stomach cancer  . Hypertension Brother   . Diabetes Brother        Type II  . Osteoporosis Maternal Aunt   . Hypertension Father   . Stroke Father     Social  History Social History   Tobacco Use  . Smoking status: Never Smoker  . Smokeless tobacco: Never Used  Vaping Use  . Vaping Use: Never used  Substance Use Topics  . Alcohol use: No    Alcohol/week: 0.0 standard drinks  . Drug use: No     Allergies   Patient has no known allergies.   Review of Systems Review of Systems Pertinent negatives listed in HPI   Physical Exam Triage Vital Signs ED Triage Vitals  Enc Vitals Group     BP 04/20/20 1016 (!) 172/76     Pulse Rate 04/20/20 1016 88     Resp 04/20/20 1016 18     Temp 04/20/20 1016 98.6 F (37 C)     Temp Source 04/20/20 1016 Oral     SpO2 04/20/20 1016 97 %     Weight 04/20/20 1011 145 lb (65.8 kg)     Height 04/20/20 1011 5' (1.524 m)     Head Circumference --      Peak Flow --      Pain Score 04/20/20 1011 2     Pain Loc --      Pain Edu? --      Excl. in [place]? --    No data found.  Updated Vital Signs BP (!) 167/83 (BP Location: Right Arm)   Pulse 88   Temp 98.6 F (37 C) (Oral)   Resp 18   Ht 5' (1.524 m)   Wt 145 lb (65.8 kg)   LMP 03/23/1998 (LMP Unknown)   SpO2 97%   BMI 28.32 kg/m   Visual Acuity Right Eye Distance:   Left Eye Distance:   Bilateral Distance:    Right Eye Near:   Left Eye Near:    Bilateral Near:     Physical Exam Constitutional:      General: She is not in acute distress.    Appearance: She is not ill-appearing or toxic-appearing.  Abdominal:     General: Abdomen is protuberant. Bowel sounds are decreased. There is no distension.     Palpations: Abdomen is soft.     Tenderness: There is abdominal tenderness in the left lower quadrant. There is no right CVA tenderness, left CVA tenderness or rebound.     Hernia: No hernia is present.  Skin:    General: Skin is warm.     Capillary Refill: Capillary refill takes less than  2 seconds.  Neurological:     General: No focal deficit present.     Mental Status: She is alert.     Sensory: Sensation is intact.     Motor:  Motor function is intact.     Coordination: Coordination is intact.     Gait: Gait is intact.  Psychiatric:        Attention and Perception: Attention and perception normal.        Speech: Speech normal.         UC Treatments / Results  Labs (all labs ordered are listed, but only abnormal results are displayed) Labs Reviewed - No data to display  EKG   Radiology No results found.  Procedures Procedures (including critical care time)  Medications Ordered in UC Medications - No data to display  Initial Impression / Assessment and Plan / UC Course  I have reviewed the triage vital signs and the nursing notes.  Pertinent labs & imaging results that were available during my care of the patient were reviewed by me and considered in my medical decision making (see chart for details).     Patient presented today with left lower quadrant abdominal pain x2 days persistently.  UA positive for hematuria abdominal imaging negative for any calculi or bowel obstruction.  Urine culture pending.Reviewed colonoscopy from 02/28/2018 according to impression patient was found to have diverticulosis involving the sigmoid colon.  Highly suspect patient has had an episode of diverticulitis, however patient also has had previous infection of C. difficile therefore opted to avoid treatment with fluoroquinolone therapy. Patient is afebrile and main symptom is persistent lower quadrant cramping type pain which waxes and wanes.  Will trial a course of metronidazole twice daily for 14 days along with treatment of pain with dicyclomine.  Encourage patient to schedule follow-up with primary care provider in 7 to 10 days for further work-up and evaluation ER if symptoms become severe and do not improve. We discussed diet and to avoid any foods which are rounded and small that could possibly become obstructed in the diverticulum of the colon wall.  Patient verbalized understanding and agreement with plan.  Final  Clinical Impressions(s) / UC Diagnoses   Final diagnoses:  Left lower quadrant abdominal pain  Hematuria, unspecified type  Diverticulitis of colon     Discharge Instructions     Your imaging today is negative for any bowel obstruction.  Given the presentation of your symptoms I suspect you are likely experiencing diverticulitis.  I am treating you today for diverticulitis and would like for you to schedule a follow-up with your primary care provider for the first part of next week.  If your symptoms worsen or become more severe go immediately to the emergency department for further work-up and advanced imaging.    ED Prescriptions    Medication Sig Dispense Auth. Provider   metroNIDAZOLE (FLAGYL) 500 MG tablet Take 1 tablet (500 mg total) by mouth 2 (two) times daily for 14 days. 28 tablet Scot Jun, FNP   dicyclomine (BENTYL) 10 MG capsule Take 1 tablet by mouth 3 times daily with food as needed for abdominal spasm and cramping.  May also take 1 dose prior to bedtime as needed 20 capsule Scot Jun, FNP     PDMP not reviewed this encounter.   Scot Jun, FNP 04/20/20 [phone]

## 2020-04-20 NOTE — ED Triage Notes (Signed)
Pt presents to Urgent Care with c/o LLQ abdominal pain x 2 days. She states the pain is momentarily relieved after taking Gas-x. Denies n/v and dysuria.

## 2020-04-20 NOTE — Discharge Instructions (Addendum)
Your imaging today is negative for any bowel obstruction.  Given the presentation of your symptoms I suspect you are likely experiencing diverticulitis.  I am treating you today for diverticulitis and would like for you to schedule a follow-up with your primary care provider for the first part of next week.  If your symptoms worsen or become more severe go immediately to the emergency department for further work-up and advanced imaging.

## 2020-04-21 LAB — URINE CULTURE
MICRO NUMBER:: 11474396
Result:: NO GROWTH
SPECIMEN QUALITY:: ADEQUATE

## 2020-05-16 ENCOUNTER — Other Ambulatory Visit: Payer: Self-pay | Admitting: Family Medicine

## 2020-05-16 DIAGNOSIS — E785 Hyperlipidemia, unspecified: Secondary | ICD-10-CM

## 2020-05-17 ENCOUNTER — Other Ambulatory Visit: Payer: Self-pay

## 2020-05-17 ENCOUNTER — Ambulatory Visit (INDEPENDENT_AMBULATORY_CARE_PROVIDER_SITE_OTHER): Payer: Medicare PPO | Admitting: Family Medicine

## 2020-05-17 ENCOUNTER — Encounter: Payer: Self-pay | Admitting: Family Medicine

## 2020-05-17 VITALS — BP 138/80 | HR 58 | Temp 97.9°F | Resp 17 | Ht 61.0 in | Wt 149.2 lb

## 2020-05-17 DIAGNOSIS — B9689 Other specified bacterial agents as the cause of diseases classified elsewhere: Secondary | ICD-10-CM | POA: Diagnosis not present

## 2020-05-17 DIAGNOSIS — J019 Acute sinusitis, unspecified: Secondary | ICD-10-CM | POA: Diagnosis not present

## 2020-05-17 DIAGNOSIS — K219 Gastro-esophageal reflux disease without esophagitis: Secondary | ICD-10-CM | POA: Insufficient documentation

## 2020-05-17 DIAGNOSIS — E663 Overweight: Secondary | ICD-10-CM | POA: Diagnosis not present

## 2020-05-17 DIAGNOSIS — Z860100 Personal history of colon polyps, unspecified: Secondary | ICD-10-CM | POA: Insufficient documentation

## 2020-05-17 DIAGNOSIS — K5792 Diverticulitis of intestine, part unspecified, without perforation or abscess without bleeding: Secondary | ICD-10-CM

## 2020-05-17 DIAGNOSIS — E785 Hyperlipidemia, unspecified: Secondary | ICD-10-CM | POA: Diagnosis not present

## 2020-05-17 DIAGNOSIS — K648 Other hemorrhoids: Secondary | ICD-10-CM | POA: Insufficient documentation

## 2020-05-17 DIAGNOSIS — K5730 Diverticulosis of large intestine without perforation or abscess without bleeding: Secondary | ICD-10-CM | POA: Insufficient documentation

## 2020-05-17 LAB — LIPID PANEL
Cholesterol: 162 mg/dL (ref 0–200)
HDL: 43.6 mg/dL (ref >39.00)
LDL Cholesterol: 88 mg/dL (ref 0–99)
NonHDL: 118.62
Total CHOL/HDL Ratio: 4
Triglycerides: 155 mg/dL — ABNORMAL HIGH (ref 0.0–149.0)
VLDL: 31 mg/dL (ref 0.0–40.0)

## 2020-05-17 LAB — CBC WITH DIFFERENTIAL/PLATELET
Basophils Absolute: 0.1 10*3/uL (ref 0.0–0.1)
Basophils Relative: 1 % (ref 0.0–3.0)
Eosinophils Absolute: 0.2 10*3/uL (ref 0.0–0.7)
Eosinophils Relative: 3.4 % (ref 0.0–5.0)
HCT: 38.9 % (ref 36.0–46.0)
Hemoglobin: 13.1 g/dL (ref 12.0–15.0)
Lymphocytes Relative: 29.5 % (ref 12.0–46.0)
Lymphs Abs: 1.8 10*3/uL (ref 0.7–4.0)
MCHC: 33.5 g/dL (ref 30.0–36.0)
MCV: 87.7 fl (ref 78.0–100.0)
Monocytes Absolute: 0.5 10*3/uL (ref 0.1–1.0)
Monocytes Relative: 7.7 % (ref 3.0–12.0)
Neutro Abs: 3.6 10*3/uL (ref 1.4–7.7)
Neutrophils Relative %: 58.4 % (ref 43.0–77.0)
Platelets: 280 10*3/uL (ref 150.0–400.0)
RBC: 4.44 Mil/uL (ref 3.87–5.11)
RDW: 13.9 % (ref 11.5–15.5)
WBC: 6.3 10*3/uL (ref 4.0–10.5)

## 2020-05-17 LAB — HEPATIC FUNCTION PANEL
ALT: 16 U/L (ref 0–35)
AST: 17 U/L (ref 0–37)
Albumin: 3.9 g/dL (ref 3.5–5.2)
Alkaline Phosphatase: 48 U/L (ref 39–117)
Bilirubin, Direct: 0 mg/dL (ref 0.0–0.3)
Total Bilirubin: 0.4 mg/dL (ref 0.2–1.2)
Total Protein: 6.8 g/dL (ref 6.0–8.3)

## 2020-05-17 LAB — BASIC METABOLIC PANEL
BUN: 19 mg/dL (ref 6–23)
CO2: 28 mEq/L (ref 19–32)
Calcium: 9.4 mg/dL (ref 8.4–10.5)
Chloride: 107 mEq/L (ref 96–112)
Creatinine, Ser: 1.02 mg/dL (ref 0.40–1.20)
GFR: 54.08 mL/min — ABNORMAL LOW (ref >60.00)
Glucose, Bld: 118 mg/dL — ABNORMAL HIGH (ref 70–99)
Potassium: 4.1 mEq/L (ref 3.5–5.1)
Sodium: 141 mEq/L (ref 135–145)

## 2020-05-17 LAB — TSH: TSH: 2.42 u[IU]/mL (ref 0.35–4.50)

## 2020-05-17 MED ORDER — AMOXICILLIN 875 MG PO TABS: 875.0000 mg | ORAL_TABLET | Freq: Two times a day (BID) | ORAL | 0 refills | Status: DC

## 2020-05-17 NOTE — Assessment & Plan Note (Signed)
Chronic problem.  On Simvastatin 40mg daily w/o difficulty.  Check labs.  Adjust meds prn  

## 2020-05-17 NOTE — Assessment & Plan Note (Signed)
Pt continues to lose weight.  Applauded her efforts at increased walking and low carb diet.  Will continue to follow.

## 2020-05-17 NOTE — Patient Instructions (Signed)
Schedule your complete physical in 6 months We'll notify you of your lab results and make any changes if needed START the Amoxicillin twice daily- take w/ food Continue your daily allergy medication- the season is starting Call with any questions or concerns Stay Safe!  Stay Healthy!!

## 2020-05-17 NOTE — Progress Notes (Signed)
Subjective:    Patient ID: Heather Gallegos, female    DOB: 1945/11/11, 75 y.o.   MRN: [id number]  HPI Hyperlipidemia- chronic problem, on Simvastatin 40mg  daily.  No CP, SOB, abd pain, N/V.  Overweight- pt is down 3 lbs since August visit.  BMI is now 28.19.  Pt is trying to walk more regularly.  Has decreased carb intake.  Diverticulitis- pt had 1st episode in late January.  Sxs responded well to abx.  Has not had issues since.  Has increased fiber in diet.  'sinus thing'- pt previously saw Dr Wilburn Cornelia (ENT).  Is trying to get an appt but was told she would need to see the PA.  sxs started ~2 weeks ago.  Started as a cold- granddaughter was also sick.  COVID home tests were negative.  Now w/ frontal facial pain/pressure, HA, nasal congestion w/ PND.  Now having 'green, bloody nasal drainage'.  + tooth pain.   Review of Systems For ROS see HPI   This visit occurred during the SARS-CoV-2 public health emergency.  Safety protocols were in place, including screening questions prior to the visit, additional usage of staff PPE, and extensive cleaning of exam room while observing appropriate contact time as indicated for disinfecting solutions.       Objective:   Physical Exam Vitals reviewed.  Constitutional:      General: She is not in acute distress.    Appearance: Normal appearance. She is well-developed and well-nourished.  HENT:     Head: Normocephalic and atraumatic.     Right Ear: Tympanic membrane normal.     Left Ear: Tympanic membrane normal.     Nose: Mucosal edema and rhinorrhea present.     Right Sinus: Maxillary sinus tenderness and frontal sinus tenderness present.     Left Sinus: Maxillary sinus tenderness and frontal sinus tenderness present.     Mouth/Throat:     Mouth: Mucous membranes are normal.  Eyes:     Extraocular Movements: EOM normal.     Conjunctiva/sclera: Conjunctivae normal.     Pupils: Pupils are equal, round, and reactive to light.  Cardiovascular:      Rate and Rhythm: Normal rate and regular rhythm.     Pulses: Normal pulses.     Heart sounds: Normal heart sounds.  Pulmonary:     Effort: Pulmonary effort is normal. No respiratory distress.     Breath sounds: Normal breath sounds. No wheezing.  Abdominal:     General: Abdomen is flat.     Palpations: Abdomen is soft.     Tenderness: There is no abdominal tenderness. There is no guarding.  Musculoskeletal:     Cervical back: Normal range of motion and neck supple.  Lymphadenopathy:     Cervical: No cervical adenopathy.  Neurological:     General: No focal deficit present.     Mental Status: She is alert and oriented to person, place, and time.  Psychiatric:        Mood and Affect: Mood normal.        Behavior: Behavior normal.        Thought Content: Thought content normal.           Assessment & Plan:  Bacterial sinusitis- pt has hx of similar.  sxs started 2 weeks ago w/ viral sxs but since then has developed facial pain/pressure, tooth pain, and bloody/green nasal drainage.  Start Amoxicillin.  Reviewed supportive care and red flags that should prompt return.  Pt expressed understanding  and is in agreement w/ plan.

## 2020-05-17 NOTE — Assessment & Plan Note (Signed)
New.  Pt had 1st episode in late January.  Treated at [facility] w/ abx.  sxs have resolved and she has not had an issue since.

## 2020-05-20 ENCOUNTER — Other Ambulatory Visit: Payer: Self-pay

## 2020-05-20 ENCOUNTER — Other Ambulatory Visit (INDEPENDENT_AMBULATORY_CARE_PROVIDER_SITE_OTHER): Payer: Medicare PPO

## 2020-05-20 DIAGNOSIS — R7309 Other abnormal glucose: Secondary | ICD-10-CM | POA: Diagnosis not present

## 2020-05-20 LAB — HEMOGLOBIN A1C: Hgb A1c MFr Bld: 7.1 % — ABNORMAL HIGH (ref 4.6–6.5)

## 2020-05-31 DIAGNOSIS — J329 Chronic sinusitis, unspecified: Secondary | ICD-10-CM | POA: Diagnosis not present

## 2020-06-21 DIAGNOSIS — J329 Chronic sinusitis, unspecified: Secondary | ICD-10-CM | POA: Diagnosis not present

## 2020-06-24 ENCOUNTER — Other Ambulatory Visit: Payer: Self-pay

## 2020-06-24 ENCOUNTER — Ambulatory Visit (INDEPENDENT_AMBULATORY_CARE_PROVIDER_SITE_OTHER): Payer: Medicare PPO | Admitting: Obstetrics and Gynecology

## 2020-06-24 ENCOUNTER — Encounter: Payer: Self-pay | Admitting: Obstetrics and Gynecology

## 2020-06-24 VITALS — BP 132/78 | HR 72 | Ht 60.0 in | Wt 150.0 lb

## 2020-06-24 DIAGNOSIS — N644 Mastodynia: Secondary | ICD-10-CM | POA: Diagnosis not present

## 2020-06-24 DIAGNOSIS — N814 Uterovaginal prolapse, unspecified: Secondary | ICD-10-CM

## 2020-06-24 DIAGNOSIS — Z01419 Encounter for gynecological examination (general) (routine) without abnormal findings: Secondary | ICD-10-CM | POA: Diagnosis not present

## 2020-06-24 DIAGNOSIS — N3946 Mixed incontinence: Secondary | ICD-10-CM

## 2020-06-24 NOTE — Patient Instructions (Signed)
Breast Tenderness Breast tenderness is a common problem for women of all ages, but may also occur in men. Breast tenderness may range from mild discomfort to severe pain. In women, the pain usually comes and goes with the menstrual cycle, but it can also be constant. Breast tenderness has many possible causes, including hormone changes, infections, and taking certain medicines. You may have tests, such as a mammogram or an ultrasound, to check for any unusual findings. Having breast tenderness usually does not mean that you have breast cancer. Follow these instructions at home: Managing pain and discomfort  If directed, put ice to the painful area. To do this: ? Put ice in a plastic bag. ? Place a towel between your skin and the bag. ? Leave the ice on for 20 minutes, 2-3 times a day.  Wear a supportive bra, especially during exercise. You may also want to wear a supportive bra while sleeping if your breasts are very tender.   Medicines  Take over-the-counter and prescription medicines only as told by your health care provider. If the cause of your pain is infection, you may be prescribed an antibiotic medicine.  If you were prescribed an antibiotic, take it as told by your health care provider. Do not stop taking the antibiotic even if you start to feel better. Eating and drinking  Your health care provider may recommend that you lessen the amount of fat in your diet. You can do this by: ? Limiting fried foods. ? Cooking foods using methods such as baking, boiling, grilling, and broiling.  Decrease the amount of caffeine in your diet. Instead, drink more water and choose caffeine-free drinks. General instructions  Keep a log of the days and times when your breasts are most tender.  Ask your health care provider how to do breast exams at home. This will help you notice if you have an unusual growth or lump.  Keep all follow-up visits as told by your health care provider. This is  important.   Contact a health care provider if:  Any part of your breast is hard, red, and hot to the touch. This may be a sign of infection.  You are a woman and: ? Not breastfeeding and you have fluid, especially blood or pus, coming out of your nipples. ? Have a new or painful lump in your breast that remains after your menstrual period ends.  You have a fever.  Your pain does not improve or it gets worse.  Your pain is interfering with your daily activities. Summary  Breast tenderness may range from mild discomfort to severe pain.  Breast tenderness has many possible causes, including hormone changes, infections, and taking certain medicines.  It can be treated with ice, wearing a supportive bra, and medicines.  Make changes to your diet if told to by your health care provider. This information is not intended to replace advice given to you by your health care provider. Make sure you discuss any questions you have with your health care provider. Document Revised: 08/01/2018 Document Reviewed: 08/01/2018 Elsevier Patient Education  2021 [place]   We recommended that you start or continue a regular exercise program for good health. Physical activity is anything that gets your body moving, some is better than none. The CDC recommends 150 minutes per week of Moderate-Intensity Aerobic Activity and 2 or more days of Muscle Strengthening Activity.  Benefits of exercise are limitless: helps weight loss/weight maintenance, improves mood and energy, helps with  depression and anxiety, improves sleep, tones and strengthens muscles, improves balance, improves bone density, protects from chronic conditions such as heart disease, high blood pressure and diabetes and so much more. To learn more visit: WhyNotPoker.uy  DIET: Good nutrition starts with a healthy diet of fruits, vegetables, whole grains, and lean protein sources. Drink plenty of  water for hydration. Minimize empty calories, sodium, sweets. For more information about dietary recommendations visit: GeekRegister.com.ee and http://schaefer-mitchell.com/  ALCOHOL:  Women should limit their alcohol intake to no more than 7 drinks/beers/glasses of wine (combined, not each!) per week. Moderation of alcohol intake to this level decreases your risk of breast cancer and liver damage.  If you are concerned that you may have a problem, or your friends have told you they are concerned about your drinking, there are many resources to help. A well-known program that is free, effective, and available to all people all over the nation is Alcoholics Anonymous.  Check out this site to learn more: BlockTaxes.se   CALCIUM AND VITAMIN D:  Adequate intake of calcium and Vitamin D are recommended for bone health.  You should be getting between 1000-1200 mg of calcium and 800 units of Vitamin D daily between diet and supplements  PAP SMEARS:  Pap smears, to check for cervical cancer or precancers,  have traditionally been done yearly, scientific advances have shown that most women can have pap smears less often.  However, every woman still should have a physical exam from her gynecologist every year. It will include a breast check, inspection of the vulva and vagina to check for abnormal growths or skin changes, a visual exam of the cervix, and then an exam to evaluate the size and shape of the uterus and ovaries. We will also provide age appropriate advice regarding health maintenance, like when you should have certain vaccines, screening for sexually transmitted diseases, bone density testing, colonoscopy, mammograms, etc.   MAMMOGRAMS:  All women over 69 years old should have a routine mammogram.   COLON CANCER SCREENING: Now recommend starting at age 72. At this time colonoscopy is not covered for routine screening until 50. There are take home  tests that can be done between 45-49.   COLONOSCOPY:  Colonoscopy to screen for colon cancer is recommended for all women at age 20.  We know, you hate the idea of the prep.  We agree, BUT, having colon cancer and not knowing it is worse!!  Colon cancer so often starts as a polyp that can be seen and removed at colonscopy, which can quite literally save your life!  And if your first colonoscopy is normal and you have no family history of colon cancer, most women don't have to have it again for 10 years.  Once every ten years, you can do something that may end up saving your life, right?  We will be happy to help you get it scheduled when you are ready.  Be sure to check your insurance coverage so you understand how much it will cost.  It may be covered as a preventative service at no cost, but you should check your particular policy.      Breast Self-Awareness Breast self-awareness means being familiar with how your breasts look and feel. It involves checking your breasts regularly and reporting any changes to your health care provider. Practicing breast self-awareness is important. A change in your breasts can be a sign of a serious medical problem. Being familiar with how your breasts look and feel allows  you to find any problems early, when treatment is more likely to be successful. All women should practice breast self-awareness, including women who have had breast implants. How to do a breast self-exam One way to learn what is normal for your breasts and whether your breasts are changing is to do a breast self-exam. To do a breast self-exam: Look for Changes  1. Remove all the clothing above your waist. 2. Stand in front of a mirror in a room with good lighting. 3. Put your hands on your hips. 4. Push your hands firmly downward. 5. Compare your breasts in the mirror. Look for differences between them (asymmetry), such as: ? Differences in shape. ? Differences in size. ? Puckers, dips, and  bumps in one breast and not the other. 6. Look at each breast for changes in your skin, such as: ? Redness. ? Scaly areas. 7. Look for changes in your nipples, such as: ? Discharge. ? Bleeding. ? Dimpling. ? Redness. ? A change in position. Feel for Changes Carefully feel your breasts for lumps and changes. It is best to do this while lying on your back on the floor and again while sitting or standing in the shower or tub with soapy water on your skin. Feel each breast in the following way:  Place the arm on the side of the breast you are examining above your head.  Feel your breast with the other hand.  Start in the nipple area and make  inch (2 cm) overlapping circles to feel your breast. Use the pads of your three middle fingers to do this. Apply light pressure, then medium pressure, then firm pressure. The light pressure will allow you to feel the tissue closest to the skin. The medium pressure will allow you to feel the tissue that is a little deeper. The firm pressure will allow you to feel the tissue close to the ribs.  Continue the overlapping circles, moving downward over the breast until you feel your ribs below your breast.  Move one finger-width toward the center of the body. Continue to use the  inch (2 cm) overlapping circles to feel your breast as you move slowly up toward your collarbone.  Continue the up and down exam using all three pressures until you reach your armpit.  Write Down What You Find  Write down what is normal for each breast and any changes that you find. Keep a written record with breast changes or normal findings for each breast. By writing this information down, you do not need to depend only on memory for size, tenderness, or location. Write down where you are in your menstrual cycle, if you are still menstruating. If you are having trouble noticing differences in your breasts, do not get discouraged. With time you will become more familiar with the  variations in your breasts and more comfortable with the exam. How often should I examine my breasts? Examine your breasts every month. If you are breastfeeding, the best time to examine your breasts is after a feeding or after using a breast pump. If you menstruate, the best time to examine your breasts is 5-7 days after your period is over. During your period, your breasts are lumpier, and it may be more difficult to notice changes. When should I see my health care provider? See your health care provider if you notice:  A change in shape or size of your breasts or nipples.  A change in the skin of your breast or nipples,  such as a reddened or scaly area.  Unusual discharge from your nipples.  A lump or thick area that was not there before.  Pain in your breasts.  Anything that concerns you.

## 2020-06-24 NOTE — Progress Notes (Signed)
75 y.o. G31P2002 Married White or Caucasian Not Hispanic or Latino female here for annual exam.  Left breast soreness.  She c/o a 1 month h/o intermittent discomfort left lateral breast. The pain is achy, 2/10 in severity. Notices it a couple of times a week. Feels like it is improving.     H/O grade 2 uterine prolapse, pessary didn't work. She notices it in the shower and when she wipes, occasionally notices it at other times. Overall tolerable. No vaginal bleeding. Sexually active, no dyspareunia.   H/O mixed incontinence, unchanged. Wears a mini-pad. Mostly GSI and small amounts, leaks a couple of times a week.   Patient's last menstrual period was 03/23/1998 (lmp unknown).          Sexually active: Yes.    The current method of family planning is post menopausal status.    Exercising: No.  The patient does not participate in regular exercise at present. Smoker:  no  Health Maintenance: Pap:  12/15/2017 WNL NEG HPV, 10/24/2014 WNL negative HPV History of abnormal Pap:  Yes, colpo, negative biopsy MMG: 10/27/19 Density B Bi-rads 1 neg  BMD:   11/14/19 osteopenic, T score -1.2, FRAX 9.6/1.5% (with primary) Colonoscopy: 02/28/18 polyp f/u 5 years TDaP:  12/23/09  Gardasil: none    reports that she has never smoked. She has never used smokeless tobacco. She reports that she does not drink alcohol and does not use drugs. 2 sons, 5 grandchildren. One son and 2 grand kids live here, the others are in [place].   Past Medical History:  Diagnosis Date  . Chronic sinusitis   . Clostridium difficile infection 11/2015  . Elevated cholesterol   . Environmental allergies   . Fibroid   . GERD (gastroesophageal reflux disease)   . Hypercholesteremia   . Leg edema, left   . Low back pain   . Uterine prolapse 2010    Past Surgical History:  Procedure Laterality Date  . TONSILLECTOMY AND ADENOIDECTOMY      Current Outpatient Medications  Medication Sig Dispense Refill  . amoxicillin (AMOXIL) 875  MG tablet Take 1 tablet (875 mg total) by mouth 2 (two) times daily. 20 tablet 0  . b complex vitamins tablet Take 1 tablet by mouth daily.    . Biotin 1000 MCG tablet Take 1,000 mcg by mouth daily.    . Calcium Carbonate (CALCIUM 600 PO) Take by mouth daily.    . Cetirizine HCl (ZYRTEC PO) Take by mouth as needed.    . Cholecalciferol (VITAMIN D3) 1000 units CAPS     . Coenzyme Q10 (COQ10) 100 MG CAPS     . COVID-19 mRNA vaccine, Moderna, 100 MCG/0.5ML injection     . fluticasone (FLONASE) 50 MCG/ACT nasal spray daily as needed.     . Glucosamine 500 MG CAPS Take by mouth.    . Lactobacillus-Inulin (PROBIOTIC DIGESTIVE SUPPORT PO) Take by mouth.    . LUTEIN PO Take 20 mg by mouth daily.    Marland Kitchen omeprazole (PRILOSEC) 20 MG capsule Take 1 capsule (20 mg total) by mouth daily. 90 capsule 1  . simvastatin (ZOCOR) 40 MG tablet TAKE 1 TABLET(40 MG) BY MOUTH EVERY EVENING. FOLLOW UP IN DEC 90 tablet 0  . vitamin C (ASCORBIC ACID) 500 MG tablet Take 500 mg by mouth daily.    . vitamin E 400 UNIT capsule Take 400 Units by mouth daily.     No current facility-administered medications for this visit.    Family History  Problem Relation Age of Onset  . Cancer Mother 6       Stomach cancer  . Hypertension Brother   . Diabetes Brother        Type II  . Osteoporosis Maternal Aunt   . Hypertension Father   . Stroke Father     Review of Systems  All other systems reviewed and are negative.   Exam:   LMP 03/23/1998 (LMP Unknown)   Weight change: @WEIGHTCHANGE @ Height:      Ht Readings from Last 3 Encounters:  05/17/20 5\' 1"  (1.549 m)  04/20/20 5' (1.524 m)  10/04/19 4\' 11"  (1.499 m)    General appearance: alert, cooperative and appears stated age Head: Normocephalic, without obvious abnormality, atraumatic Neck: no adenopathy, supple, symmetrical, trachea midline and thyroid normal to inspection and palpation Breasts: no masses, mildly tender in the lateral aspect of the left breast. No  skin changes.  Abdomen: soft, non-tender; non distended,  no masses,  no organomegaly Extremities: extremities normal, atraumatic, no cyanosis or edema Skin: Skin color, texture, turgor normal. No rashes or lesions Lymph nodes: Cervical, supraclavicular, and axillary nodes normal. No abnormal inguinal nodes palpated Neurologic: Grossly normal   Pelvic: External genitalia:  no lesions              Urethra:  normal appearing urethra with no masses, tenderness or lesions              Bartholins and Skenes: normal                 Vagina: normal appearing vagina with normal color and discharge, no lesions              Cervix: no lesions. Grade 3 uterine prolapse. The cervix comes at least 3 cm outside of the vagina with valsalva.                Bimanual Exam:  Uterus:  normal size, contour, position, consistency, mobility, non-tender and retroverted              Adnexa: no mass, fullness, tenderness               Rectovaginal: Confirms               Anus:  normal sphincter tone, no lesions  Gae Dry chaperoned for the exam.  1. Encounter for gynecological examination without abnormal finding Discussed breast self exam Discussed calcium and vit D intake No pap this year Mammogram is due in August Colonoscopy and DEXA are UTD Labs with primary  2. Mastalgia Discussed avoiding caffeine, making sure her bra is fitting appropriately Can use ice or heat Ibuprofen or tylenol as needed If the pain doesn't continue to improve and resolve in the next month she will call back to set up diagnostic imaging  3. Uterine prolapse Slightly worsened, not bothersome  4. Mixed incontinence Tolerable.

## 2020-08-12 ENCOUNTER — Other Ambulatory Visit: Payer: Self-pay | Admitting: Family Medicine

## 2020-08-12 DIAGNOSIS — E785 Hyperlipidemia, unspecified: Secondary | ICD-10-CM

## 2020-09-04 ENCOUNTER — Encounter: Payer: Self-pay | Admitting: Family Medicine

## 2020-09-04 ENCOUNTER — Other Ambulatory Visit: Payer: Self-pay

## 2020-09-04 ENCOUNTER — Ambulatory Visit (INDEPENDENT_AMBULATORY_CARE_PROVIDER_SITE_OTHER): Payer: Medicare PPO | Admitting: Family Medicine

## 2020-09-04 VITALS — BP 124/80 | HR 79 | Temp 97.8°F | Resp 18 | Ht 60.0 in | Wt 149.6 lb

## 2020-09-04 DIAGNOSIS — E119 Type 2 diabetes mellitus without complications: Secondary | ICD-10-CM

## 2020-09-04 LAB — BASIC METABOLIC PANEL
BUN: 19 mg/dL (ref 6–23)
CO2: 25 mEq/L (ref 19–32)
Calcium: 9.3 mg/dL (ref 8.4–10.5)
Chloride: 106 mEq/L (ref 96–112)
Creatinine, Ser: 1.01 mg/dL (ref 0.40–1.20)
GFR: 54.61 mL/min — ABNORMAL LOW (ref >60.00)
Glucose, Bld: 123 mg/dL — ABNORMAL HIGH (ref 70–99)
Potassium: 4.6 mEq/L (ref 3.5–5.1)
Sodium: 140 mEq/L (ref 135–145)

## 2020-09-04 LAB — MICROALBUMIN / CREATININE URINE RATIO
Creatinine,U: 107.9 mg/dL
Microalb Creat Ratio: 0.7 mg/g (ref 0.0–30.0)
Microalb, Ur: 0.7 mg/dL (ref 0.0–1.9)

## 2020-09-04 LAB — HEMOGLOBIN A1C: Hgb A1c MFr Bld: 6.9 % — ABNORMAL HIGH (ref 4.6–6.5)

## 2020-09-04 NOTE — Progress Notes (Signed)
   Subjective:    Patient ID: Heather Gallegos, female    DOB: 30-Oct-1945, 75 y.o.   MRN: [id number]  HPI DM- currently diet controlled for pt.  Last A1C 7.1%.  Due for foot exam, microalbumin.  Pt states yearly eye exams w/ Dr Katy Fitch.  Pt reports feeling good.  Denies CP, SOB, HAs, visual changes, edema.  No abd pain, N/V.  No numbness/tingling of hands/feet.   Review of Systems For ROS see HPI   This visit occurred during the SARS-CoV-2 public health emergency.  Safety protocols were in place, including screening questions prior to the visit, additional usage of staff PPE, and extensive cleaning of exam room while observing appropriate contact time as indicated for disinfecting solutions.      Objective:   Physical Exam Vitals reviewed.  Constitutional:      General: She is not in acute distress.    Appearance: Normal appearance. She is well-developed. She is not ill-appearing.  HENT:     Head: Normocephalic and atraumatic.  Eyes:     Conjunctiva/sclera: Conjunctivae normal.     Pupils: Pupils are equal, round, and reactive to light.  Neck:     Thyroid: No thyromegaly.  Cardiovascular:     Rate and Rhythm: Normal rate and regular rhythm.     Pulses: Normal pulses.     Heart sounds: Normal heart sounds. No murmur heard. Pulmonary:     Effort: Pulmonary effort is normal. No respiratory distress.     Breath sounds: Normal breath sounds.  Abdominal:     General: There is no distension.     Palpations: Abdomen is soft.     Tenderness: There is no abdominal tenderness.  Musculoskeletal:     Cervical back: Normal range of motion and neck supple.     Right lower leg: No edema.     Left lower leg: No edema.  Lymphadenopathy:     Cervical: No cervical adenopathy.  Skin:    General: Skin is warm and dry.  Neurological:     Mental Status: She is alert and oriented to person, place, and time.  Psychiatric:        Behavior: Behavior normal.          Assessment & Plan:

## 2020-09-04 NOTE — Patient Instructions (Signed)
Follow up in 3-4 months to recheck sugar and cholesterol We'll notify you of your lab results and make any changes if needed Continue to work on healthy diet and regular exercise- you can do it! Have Dr Katy Fitch send me a copy of your next eye exam

## 2020-09-04 NOTE — Assessment & Plan Note (Signed)
New at last visit.  UTD on eye exam.  Foot exam done today.  Will get microalbumin.  Currently asymptomatic.  Discussed need for low carb diet and regular exercise.  Discussed yearly eye exams- pt to call and schedule for this year.  Check labs.  Start meds if needed.

## 2020-09-05 NOTE — Progress Notes (Signed)
Left vm message with results per patient's dpr.

## 2020-09-16 ENCOUNTER — Ambulatory Visit (INDEPENDENT_AMBULATORY_CARE_PROVIDER_SITE_OTHER): Payer: Medicare PPO | Admitting: *Deleted

## 2020-09-16 DIAGNOSIS — Z0000 Encounter for general adult medical examination without abnormal findings: Secondary | ICD-10-CM | POA: Diagnosis not present

## 2020-09-16 NOTE — Progress Notes (Signed)
Subjective:   Heather Gallegos is a 75 y.o. female who presents for Medicare Annual (Subsequent) preventive examination.  I connected with  Heather Gallegos on 09/16/20 by a video enabled telemedicine application and verified that I am speaking with the correct person using two identifiers.   I discussed the limitations of evaluation and management by telemedicine. The patient expressed understanding and agreed to proceed.  Patient location: home  Provider location:  Video Visit    Review of Systems    NA Cardiac Risk Factors include: advanced age (>71men, >15 women);dyslipidemia;family history of premature cardiovascular disease;obesity (BMI >30kg/m2)     Objective:    There were no vitals filed for this visit. There is no height or weight on file to calculate BMI.  Advanced Directives 09/16/2020 08/25/2017 06/11/2015  Does Patient Have a Medical Advance Directive? No No No  Would patient like information on creating a medical advance directive? No - Patient declined No - Patient declined No - patient declined information    Current Medications (verified) Outpatient Encounter Medications as of 09/16/2020  Medication Sig   b complex vitamins tablet Take 1 tablet by mouth daily.   Biotin 1000 MCG tablet Take 1,000 mcg by mouth daily.   Calcium Carbonate (CALCIUM 600 PO) Take by mouth daily.   Cetirizine HCl (ZYRTEC PO) Take by mouth as needed.   Cholecalciferol (VITAMIN D3) 1000 units CAPS    Coenzyme Q10 (COQ10) 100 MG CAPS    fluticasone (FLONASE) 50 MCG/ACT nasal spray daily as needed.    Glucosamine 500 MG CAPS Take by mouth.   Lactobacillus-Inulin (PROBIOTIC DIGESTIVE SUPPORT PO) Take by mouth.   LUTEIN PO Take 20 mg by mouth daily.   omeprazole (PRILOSEC) 20 MG capsule TAKE 1 CAPSULE(20 MG) BY MOUTH DAILY   simvastatin (ZOCOR) 40 MG tablet TAKE 1 TABLET(40 MG) BY MOUTH EVERY EVENING. FOLLOW UP IN DEC   vitamin C (ASCORBIC ACID) 500 MG tablet Take 500 mg by mouth daily.    vitamin E 400 UNIT capsule Take 400 Units by mouth daily.   No facility-administered encounter medications on file as of 09/16/2020.    Allergies (verified) Patient has no known allergies.   History: Past Medical History:  Diagnosis Date   Chronic sinusitis    Clostridium difficile infection 11/2015   Elevated cholesterol    Environmental allergies    Fibroid    GERD (gastroesophageal reflux disease)    Hypercholesteremia    Leg edema, left    Low back pain    Uterine prolapse 2010   Past Surgical History:  Procedure Laterality Date   TONSILLECTOMY AND ADENOIDECTOMY     Family History  Problem Relation Age of Onset   Cancer Mother 40       Stomach cancer   Hypertension Brother    Diabetes Brother        Type II   Osteoporosis Maternal Aunt    Hypertension Father    Stroke Father    Social History   Socioeconomic History   Marital status: Married    Spouse name: Not on file   Number of children: Not on file   Years of education: Not on file   Highest education level: Not on file  Occupational History   Not on file  Tobacco Use   Smoking status: Never   Smokeless tobacco: Never  Vaping Use   Vaping Use: Never used  Substance and Sexual Activity   Alcohol use: No    Alcohol/week: 0.0 standard  drinks   Drug use: No   Sexual activity: Yes    Partners: Male    Birth control/protection: Post-menopausal  Other Topics Concern   Not on file  Social History Narrative   Not on file   Social Determinants of Health   Financial Resource Strain: Low Risk    Difficulty of Paying Living Expenses: Not hard at all  Food Insecurity: No Food Insecurity   Worried About Charity fundraiser in the Last Year: Never true   [place] in the Last Year: Never true  Transportation Needs: No Transportation Needs   Lack of Transportation (Medical): No   Lack of Transportation (Non-Medical): No  Physical Activity: Insufficiently Active   Days of Exercise per Week: 3 days    Minutes of Exercise per Session: 30 min  Stress: No Stress Concern Present   Feeling of Stress : Not at all  Social Connections: Socially Integrated   Frequency of Communication with Friends and Family: Three times a week   Frequency of Social Gatherings with Friends and Family: Three times a week   Attends Religious Services: More than 4 times per year   Active Member of Clubs or Organizations: Yes   Attends Music therapist: More than 4 times per year   Marital Status: Married    Tobacco Counseling Counseling given: Not Answered   Clinical Intake:  Pre-visit preparation completed: Yes  Pain : No/denies pain     Nutritional Risks: None Diabetes: No     Diabetic?   NO  Interpreter Needed?: No  Information entered by :: Leroy Kennedy LPN   Activities of Daily Living In your present state of health, do you have any difficulty performing the following activities: 09/16/2020 09/04/2020  Hearing? N N  Vision? N N  Difficulty concentrating or making decisions? N N  Walking or climbing stairs? N N  Dressing or bathing? N N  Doing errands, shopping? N N  Preparing Food and eating ? N -  Using the Toilet? N -  In the past six months, have you accidently leaked urine? N -  Do you have problems with loss of bowel control? N -  Managing your Medications? N -  Managing your Finances? N -  Housekeeping or managing your Housekeeping? N -  Some recent data might be hidden    Patient Care Team: Midge Minium, MD as PCP - General (Family Medicine) Juanita Craver, MD as Consulting Physician (Gastroenterology) Salvadore Dom, MD as Consulting Physician (Obstetrics and Gynecology) [facility] as Consulting Physician  Indicate any recent Medical Services you may have received from other than [facility] providers in the past year (date may be approximate).     Assessment:   This is a routine wellness examination for Heather Gallegos.  Hearing/Vision  screen Hearing Screening - Comments:: No trouble hearing  Vision Screening - Comments:: Dr. Katy Fitch Up to Date  Dietary issues and exercise activities discussed: Current Exercise Habits: Home exercise routine, Type of exercise: walking, Time (Minutes): 30, Frequency (Times/Week): 3, Weekly Exercise (Minutes/Week): 90, Intensity: Mild   Goals Addressed             This Visit's Progress    Increase physical activity   On track    Increase activity by walking more.       Patient Stated       Increase physical activity with more walking        Depression Screen PHQ 2/9 Scores 09/04/2020  05/17/2020 02/01/2020 10/04/2019 09/13/2019 12/28/2018 08/31/2018  PHQ - 2 Score 0 0 0 0 0 0 0  PHQ- 9 Score 0 0 0 - 0 0 -    Fall Risk Fall Risk  09/16/2020 09/04/2020 05/17/2020 02/01/2020 09/13/2019  Falls in the past year? 0 0 0 0 0  Number falls in past yr: 0 0 0 0 0  Injury with Fall? 0 0 0 0 0  Risk for fall due to : - No Fall Risks No Fall Risks No Fall Risks -  Follow up Falls evaluation completed;Falls prevention discussed - - - Falls evaluation completed    FALL RISK PREVENTION PERTAINING TO THE HOME:  Any stairs in or around the home? Yes  If so, are there any without handrails? Yes  Home free of loose throw rugs in walkways, pet beds, electrical cords, etc? Yes  Adequate lighting in your home to reduce risk of falls? Yes   ASSISTIVE DEVICES UTILIZED TO PREVENT FALLS:  Life alert? No  Use of a cane, walker or w/c? Yes  Grab bars in the bathroom? Yes  Shower chair or bench in shower? No  Elevated toilet seat or a handicapped toilet? No   TIMED UP AND GO:  Was the test performed? No .   Tele-Health visit    Cognitive Function: MMSE - Mini Mental State Exam 08/25/2017  Orientation to time 5  Orientation to Place 5  Registration 3  Attention/ Calculation 5  Recall 3  Language- name 2 objects 2  Language- repeat 1  Language- follow 3 step command 3  Language- read & follow  direction 1  Write a sentence 1  Copy design 1  Total score 30        Immunizations Immunization History  Administered Date(s) Administered   Fluad Quad(high Dose 65+) 12/28/2018   Influenza Split 01/23/2020   Influenza,inj,Quad PF,6+ Mos 02/26/2015, 12/05/2015, 02/16/2017, 11/25/2017   Moderna Sars-Covid-2 Vaccination 05/06/2019, 05/07/2019, 06/04/2019, 04/05/2020   Pneumococcal Conjugate-13 08/21/2016   Pneumococcal Polysaccharide-23 10/29/2010   Tdap 12/23/2009   Zoster Recombinat (Shingrix) 08/21/2017, 11/25/2017   Zoster, Live 12/23/2009    TDAP status: Due, Education has been provided regarding the importance of this vaccine. Advised may receive this vaccine at local pharmacy or Health Dept. Aware to provide a copy of the vaccination record if obtained from local pharmacy or Health Dept. Verbalized acceptance and understanding.  Flu Vaccine status: Up to date  Pneumococcal vaccine status: Up to date  Covid-19 vaccine status: Completed vaccines  Qualifies for Shingles Vaccine? Yes   Zostavax completed Yes   Shingrix Completed?: No.    Education has been provided regarding the importance of this vaccine. Patient has been advised to call insurance company to determine out of pocket expense if they have not yet received this vaccine. Advised may also receive vaccine at local pharmacy or Health Dept. Verbalized acceptance and understanding.  Screening Tests Health Maintenance  Topic Date Due   TETANUS/TDAP  12/24/2019   OPHTHALMOLOGY EXAM  10/10/2020   INFLUENZA VACCINE  10/21/2020   MAMMOGRAM  10/25/2020   HEMOGLOBIN A1C  03/06/2021   FOOT EXAM  09/04/2021   URINE MICROALBUMIN  09/04/2021   COLONOSCOPY (Pts 45-51yrs Insurance coverage will need to be confirmed)  03/01/2023   DEXA SCAN  Completed   COVID-19 Vaccine  Completed   Hepatitis C Screening  Completed   PNA vac Low Risk Adult  Completed   Zoster Vaccines- Shingrix  Completed   HPV VACCINES  Aged  Out     Health Maintenance  Health Maintenance Due  Topic Date Due   TETANUS/TDAP  12/24/2019    Colorectal cancer screening: Type of screening: Colonoscopy. Completed 02-28-2018. Repeat every 5 years  Mammogram status: Completed 10-26-2019. Repeat every year  Bone Density status: Completed 11-14-2019. Results reflect: Bone density results: OSTEOPOROSIS. Repeat every 2 years.  Lung Cancer Screening: (Low Dose CT Chest recommended if Age 47-80 years, 30 pack-year currently smoking OR have quit w/in 15years.) does not qualify.   Lung Cancer Screening Referral: 2  Additional Screening:  Hepatitis C Screening: does not qualify; Completed 01-15-2018  Vision Screening: Recommended annual ophthalmology exams for early detection of glaucoma and other disorders of the eye. Is the patient up to date with their annual eye exam?   Who is the provider or what is the name of the office in which the patient attends annual eye exams? Dr. Katy Fitch If pt is not established with a provider, would they like to be referred to a provider to establish care?  Established .   Dental Screening: Recommended annual dental exams for proper oral hygiene  Community Resource Referral / Chronic Care Management: CRR required this visit?  No   CCM required this visit?  No      Plan:     I have personally reviewed and noted the following in the patient's chart:   Medical and social history Use of alcohol, tobacco or illicit drugs  Current medications and supplements including opioid prescriptions.  Functional ability and status Nutritional status Physical activity Advanced directives List of other physicians Hospitalizations, surgeries, and ER visits in previous 12 months Vitals Screenings to include cognitive, depression, and falls Referrals and appointments  In addition, I have reviewed and discussed with patient certain preventive protocols, quality metrics, and best practice recommendations. A written  personalized care plan for preventive services as well as general preventive health recommendations were provided to patient.     Leroy Kennedy, LPN   [id number]/[id number]   Nurse Notes: NA

## 2020-09-16 NOTE — Patient Instructions (Signed)
Heather Gallegos , Thank you for taking time to come for your Medicare Wellness Visit. I appreciate your ongoing commitment to your health goals. Please review the following plan we discussed and let me know if I can assist you in the future.   Screening recommendations/referrals: Colonoscopy: up to date Mammogram: up to date Bone Density: up to date Recommended yearly ophthalmology/optometry visit for glaucoma screening and checkup Recommended yearly dental visit for hygiene and checkup  Vaccinations: Influenza vaccine: up to date Pneumococcal vaccine: up to date Tdap vaccine: Education provide Shingles vaccine: up to date    Advanced directives: Education provided  Conditions/risks identified: na  Next appointment: 12-13-2020 @ 7:30 am Dr. Birdie Riddle   Preventive Care 5 Years and Older, Female Preventive care refers to lifestyle choices and visits with your health care provider that can promote health and wellness. What does preventive care include? A yearly physical exam. This is also called an annual well check. Dental exams once or twice a year. Routine eye exams. Ask your health care provider how often you should have your eyes checked. Personal lifestyle choices, including: Daily care of your teeth and gums. Regular physical activity. Eating a healthy diet. Avoiding tobacco and drug use. Limiting alcohol use. Practicing safe sex. Taking low-dose aspirin every day. Taking vitamin and mineral supplements as recommended by your health care provider. What happens during an annual well check? The services and screenings done by your health care provider during your annual well check will depend on your age, overall health, lifestyle risk factors, and family history of disease. Counseling  Your health care provider may ask you questions about your: Alcohol use. Tobacco use. Drug use. Emotional well-being. Home and relationship well-being. Sexual activity. Eating  habits. History of falls. Memory and ability to understand (cognition). Work and work Statistician. Reproductive health. Screening  You may have the following tests or measurements: Height, weight, and BMI. Blood pressure. Lipid and cholesterol levels. These may be checked every 5 years, or more frequently if you are over 31 years old. Skin check. Lung cancer screening. You may have this screening every year starting at age 71 if you have a 30-pack-year history of smoking and currently smoke or have quit within the past 15 years. Fecal occult blood test (FOBT) of the stool. You may have this test every year starting at age 48. Flexible sigmoidoscopy or colonoscopy. You may have a sigmoidoscopy every 5 years or a colonoscopy every 10 years starting at age 28. Hepatitis C blood test. Hepatitis B blood test. Sexually transmitted disease (STD) testing. Diabetes screening. This is done by checking your blood sugar (glucose) after you have not eaten for a while (fasting). You may have this done every 1-3 years. Bone density scan. This is done to screen for osteoporosis. You may have this done starting at age 35. Mammogram. This may be done every 1-2 years. Talk to your health care provider about how often you should have regular mammograms. Talk with your health care provider about your test results, treatment options, and if necessary, the need for more tests. Vaccines  Your health care provider may recommend certain vaccines, such as: Influenza vaccine. This is recommended every year. Tetanus, diphtheria, and acellular pertussis (Tdap, Td) vaccine. You may need a Td booster every 10 years. Zoster vaccine. You may need this after age 73. Pneumococcal 13-valent conjugate (PCV13) vaccine. One dose is recommended after age 81. Pneumococcal polysaccharide (PPSV23) vaccine. One dose is recommended after age 31. Talk to your health  care provider about which screenings and vaccines you need and how  often you need them. This information is not intended to replace advice given to you by your health care provider. Make sure you discuss any questions you have with your health care provider. Document Released: 04/05/2015 Document Revised: 11/27/2015 Document Reviewed: 01/08/2015 Elsevier Interactive Patient Education  2017 [place] Prevention in the Home Falls can cause injuries. They can happen to people of all ages. There are many things you can do to make your home safe and to help prevent falls. What can I do on the outside of my home? Regularly fix the edges of walkways and driveways and fix any cracks. Remove anything that might make you trip as you walk through a door, such as a raised step or threshold. Trim any bushes or trees on the path to your home. Use bright outdoor lighting. Clear any walking paths of anything that might make someone trip, such as rocks or tools. Regularly check to see if handrails are loose or broken. Make sure that both sides of any steps have handrails. Any raised decks and porches should have guardrails on the edges. Have any leaves, snow, or ice cleared regularly. Use sand or salt on walking paths during winter. Clean up any spills in your garage right away. This includes oil or grease spills. What can I do in the bathroom? Use night lights. Install grab bars by the toilet and in the tub and shower. Do not use towel bars as grab bars. Use non-skid mats or decals in the tub or shower. If you need to sit down in the shower, use a plastic, non-slip stool. Keep the floor dry. Clean up any water that spills on the floor as soon as it happens. Remove soap buildup in the tub or shower regularly. Attach bath mats securely with double-sided non-slip rug tape. Do not have throw rugs and other things on the floor that can make you trip. What can I do in the bedroom? Use night lights. Make sure that you have a light by your bed that is easy to  reach. Do not use any sheets or blankets that are too big for your bed. They should not hang down onto the floor. Have a firm chair that has side arms. You can use this for support while you get dressed. Do not have throw rugs and other things on the floor that can make you trip. What can I do in the kitchen? Clean up any spills right away. Avoid walking on wet floors. Keep items that you use a lot in easy-to-reach places. If you need to reach something above you, use a strong step stool that has a grab bar. Keep electrical cords out of the way. Do not use floor polish or wax that makes floors slippery. If you must use wax, use non-skid floor wax. Do not have throw rugs and other things on the floor that can make you trip. What can I do with my stairs? Do not leave any items on the stairs. Make sure that there are handrails on both sides of the stairs and use them. Fix handrails that are broken or loose. Make sure that handrails are as long as the stairways. Check any carpeting to make sure that it is firmly attached to the stairs. Fix any carpet that is loose or worn. Avoid having throw rugs at the top or bottom of the stairs. If you do have throw rugs, attach them to  the floor with carpet tape. Make sure that you have a light switch at the top of the stairs and the bottom of the stairs. If you do not have them, ask someone to add them for you. What else can I do to help prevent falls? Wear shoes that: Do not have high heels. Have rubber bottoms. Are comfortable and fit you well. Are closed at the toe. Do not wear sandals. If you use a stepladder: Make sure that it is fully opened. Do not climb a closed stepladder. Make sure that both sides of the stepladder are locked into place. Ask someone to hold it for you, if possible. Clearly mark and make sure that you can see: Any grab bars or handrails. First and last steps. Where the edge of each step is. Use tools that help you move  around (mobility aids) if they are needed. These include: Canes. Walkers. Scooters. Crutches. Turn on the lights when you go into a dark area. Replace any light bulbs as soon as they burn out. Set up your furniture so you have a clear path. Avoid moving your furniture around. If any of your floors are uneven, fix them. If there are any pets around you, be aware of where they are. Review your medicines with your doctor. Some medicines can make you feel dizzy. This can increase your chance of falling. Ask your doctor what other things that you can do to help prevent falls. This information is not intended to replace advice given to you by your health care provider. Make sure you discuss any questions you have with your health care provider. Document Released: 01/03/2009 Document Revised: 08/15/2015 Document Reviewed: 04/13/2014 Elsevier Interactive Patient Education  2017 [employer].

## 2020-09-18 ENCOUNTER — Encounter: Payer: Self-pay | Admitting: *Deleted

## 2020-09-28 DIAGNOSIS — L03031 Cellulitis of right toe: Secondary | ICD-10-CM | POA: Diagnosis not present

## 2020-11-07 ENCOUNTER — Other Ambulatory Visit: Payer: Self-pay | Admitting: Family Medicine

## 2020-11-07 DIAGNOSIS — E785 Hyperlipidemia, unspecified: Secondary | ICD-10-CM

## 2020-11-09 ENCOUNTER — Other Ambulatory Visit: Payer: Self-pay | Admitting: Family Medicine

## 2020-11-09 DIAGNOSIS — E785 Hyperlipidemia, unspecified: Secondary | ICD-10-CM

## 2020-11-13 ENCOUNTER — Ambulatory Visit (INDEPENDENT_AMBULATORY_CARE_PROVIDER_SITE_OTHER): Payer: Medicare PPO | Admitting: Registered Nurse

## 2020-11-13 ENCOUNTER — Other Ambulatory Visit: Payer: Self-pay

## 2020-11-13 ENCOUNTER — Encounter: Payer: Self-pay | Admitting: Registered Nurse

## 2020-11-13 VITALS — BP 138/70 | HR 69 | Temp 98.1°F | Resp 16 | Wt 149.4 lb

## 2020-11-13 DIAGNOSIS — J019 Acute sinusitis, unspecified: Secondary | ICD-10-CM

## 2020-11-13 DIAGNOSIS — B9689 Other specified bacterial agents as the cause of diseases classified elsewhere: Secondary | ICD-10-CM | POA: Diagnosis not present

## 2020-11-13 MED ORDER — BENZONATATE 200 MG PO CAPS: 200.0000 mg | ORAL_CAPSULE | Freq: Two times a day (BID) | ORAL | 0 refills | Status: DC | PRN

## 2020-11-13 MED ORDER — AMOXICILLIN-POT CLAVULANATE 875-125 MG PO TABS: 1.0000 | ORAL_TABLET | Freq: Two times a day (BID) | ORAL | 0 refills | Status: DC

## 2020-11-13 NOTE — Progress Notes (Signed)
Acute Office Visit  Subjective:    Patient ID: Heather Gallegos, female    DOB: 1945-08-03, 75 y.o.   MRN: [id number]  Chief Complaint  Patient presents with   Cough    Pt states she has had some drainage (yellowish), headache and head feels stuffy    HPI Patient is in today for cough  Onset about 3 weeks ago Initial as upper respiratory, then to sinus, now cough Productive cough, feels from pnd, yellow mucus Some mild sinus headache. No shob, pain with breathing, chest pain, fevers, nvd, or other changes  Multiple negative covid tests  Vaccinated x 2, boosted x 2  Sick contacts: 2 grandchildren in her home who had viral illnesses recently that resolved in a few days to a week  Past Medical History:  Diagnosis Date   Chronic sinusitis    Clostridium difficile infection 11/2015   Elevated cholesterol    Environmental allergies    Fibroid    GERD (gastroesophageal reflux disease)    Hypercholesteremia    Leg edema, left    Low back pain    Uterine prolapse 2010    Past Surgical History:  Procedure Laterality Date   TONSILLECTOMY AND ADENOIDECTOMY      Family History  Problem Relation Age of Onset   Cancer Mother 24       Stomach cancer   Hypertension Brother    Diabetes Brother        Type II   Osteoporosis Maternal Aunt    Hypertension Father    Stroke Father     Social History   Socioeconomic History   Marital status: Married    Spouse name: Not on file   Number of children: Not on file   Years of education: Not on file   Highest education level: Not on file  Occupational History   Not on file  Tobacco Use   Smoking status: Never   Smokeless tobacco: Never  Vaping Use   Vaping Use: Never used  Substance and Sexual Activity   Alcohol use: No    Alcohol/week: 0.0 standard drinks   Drug use: No   Sexual activity: Yes    Partners: Male    Birth control/protection: Post-menopausal  Other Topics Concern   Not on file  Social History Narrative    Not on file   Social Determinants of Health   Financial Resource Strain: Low Risk    Difficulty of Paying Living Expenses: Not hard at all  Food Insecurity: No Food Insecurity   Worried About Charity fundraiser in the Last Year: Never true   Ran Out of Food in the Last Year: Never true  Transportation Needs: No Transportation Needs   Lack of Transportation (Medical): No   Lack of Transportation (Non-Medical): No  Physical Activity: Insufficiently Active   Days of Exercise per Week: 3 days   Minutes of Exercise per Session: 30 min  Stress: No Stress Concern Present   Feeling of Stress : Not at all  Social Connections: Socially Integrated   Frequency of Communication with Friends and Family: Three times a week   Frequency of Social Gatherings with Friends and Family: Three times a week   Attends Religious Services: More than 4 times per year   Active Member of Clubs or Organizations: Yes   Attends Music therapist: More than 4 times per year   Marital Status: Married  Human resources officer Violence: Not At Risk   Fear of Current or Ex-Partner:  No   Emotionally Abused: No   Physically Abused: No   Sexually Abused: No    Outpatient Medications Prior to Visit  Medication Sig Dispense Refill   b complex vitamins tablet Take 1 tablet by mouth daily.     Biotin 1000 MCG tablet Take 1,000 mcg by mouth daily.     Calcium Carbonate (CALCIUM 600 PO) Take by mouth daily.     Cetirizine HCl (ZYRTEC PO) Take by mouth as needed.     Cholecalciferol (VITAMIN D3) 1000 units CAPS      Coenzyme Q10 (COQ10) 100 MG CAPS      fluticasone (FLONASE) 50 MCG/ACT nasal spray daily as needed.      Glucosamine 500 MG CAPS Take by mouth.     Lactobacillus-Inulin (PROBIOTIC DIGESTIVE SUPPORT PO) Take by mouth.     LUTEIN PO Take 20 mg by mouth daily.     omeprazole (PRILOSEC) 20 MG capsule TAKE 1 CAPSULE(20 MG) BY MOUTH DAILY 90 capsule 1   simvastatin (ZOCOR) 40 MG tablet TAKE 1 TABLET(40  MG) BY MOUTH EVERY EVENING. FOLLOW UP IN DEC 90 tablet 0   vitamin C (ASCORBIC ACID) 500 MG tablet Take 500 mg by mouth daily.     vitamin E 400 UNIT capsule Take 400 Units by mouth daily.     No facility-administered medications prior to visit.    No Known Allergies  Review of Systems Per hpi      Objective:    Physical Exam Vitals and nursing note reviewed.  Constitutional:      General: She is not in acute distress.    Appearance: Normal appearance. She is not ill-appearing, toxic-appearing or diaphoretic.  HENT:     Nose: Congestion and rhinorrhea present.     Right Sinus: Maxillary sinus tenderness and frontal sinus tenderness present.     Left Sinus: Maxillary sinus tenderness and frontal sinus tenderness present.     Mouth/Throat:     Mouth: Mucous membranes are moist.     Pharynx: Oropharynx is clear. Posterior oropharyngeal erythema present. No oropharyngeal exudate.  Cardiovascular:     Rate and Rhythm: Normal rate and regular rhythm.     Pulses: Normal pulses.     Heart sounds: Normal heart sounds. No murmur heard.   No friction rub. No gallop.  Pulmonary:     Effort: Pulmonary effort is normal. No respiratory distress.     Breath sounds: Normal breath sounds. No stridor. No wheezing, rhonchi or rales.  Chest:     Chest wall: No tenderness.  Skin:    General: Skin is warm and dry.     Capillary Refill: Capillary refill takes less than 2 seconds.  Neurological:     General: No focal deficit present.     Mental Status: She is alert and oriented to person, place, and time. Mental status is at baseline.  Psychiatric:        Mood and Affect: Mood normal.        Behavior: Behavior normal.        Thought Content: Thought content normal.        Judgment: Judgment normal.    BP 138/70   Pulse 69   Temp 98.1 F (36.7 C) (Temporal)   Resp 16   Wt 149 lb 6.4 oz (67.8 kg)   LMP 03/23/1998 (LMP Unknown)   SpO2 97%   BMI 29.18 kg/m  Wt Readings from Last 3  Encounters:  11/13/20 149 lb 6.4 oz (67.8 kg)  09/04/20 149 lb 9.6 oz (67.9 kg)  06/24/20 150 lb (68 kg)    Health Maintenance Due  Topic Date Due   INFLUENZA VACCINE  10/21/2020    There are no preventive care reminders to display for this patient.   Lab Results  Component Value Date   TSH 2.42 05/17/2020   Lab Results  Component Value Date   WBC 6.3 05/17/2020   HGB 13.1 05/17/2020   HCT 38.9 05/17/2020   MCV 87.7 05/17/2020   PLT 280.0 05/17/2020   Lab Results  Component Value Date   NA 140 09/04/2020   K 4.6 09/04/2020   CO2 25 09/04/2020   GLUCOSE 123 (H) 09/04/2020   BUN 19 09/04/2020   CREATININE 1.01 09/04/2020   BILITOT 0.4 05/17/2020   ALKPHOS 48 05/17/2020   AST 17 05/17/2020   ALT 16 05/17/2020   PROT 6.8 05/17/2020   ALBUMIN 3.9 05/17/2020   CALCIUM 9.3 09/04/2020   GFR 54.61 (L) 09/04/2020   Lab Results  Component Value Date   CHOL 162 05/17/2020   Lab Results  Component Value Date   HDL 43.60 05/17/2020   Lab Results  Component Value Date   LDLCALC 88 05/17/2020   Lab Results  Component Value Date   TRIG 155.0 (H) 05/17/2020   Lab Results  Component Value Date   CHOLHDL 4 05/17/2020   Lab Results  Component Value Date   HGBA1C 6.9 (H) 09/04/2020       Assessment & Plan:   Problem List Items Addressed This Visit   None Visit Diagnoses     Acute bacterial sinusitis    -  Primary   Relevant Medications   amoxicillin-clavulanate (AUGMENTIN) 875-125 MG tablet   benzonatate (TESSALON) 200 MG capsule        Meds ordered this encounter  Medications   amoxicillin-clavulanate (AUGMENTIN) 875-125 MG tablet    Sig: Take 1 tablet by mouth 2 (two) times daily.    Dispense:  20 tablet    Refill:  0    Order Specific Question:   Supervising Provider    Answer:   Carlota Raspberry, JEFFREY R [[id number]]   benzonatate (TESSALON) 200 MG capsule    Sig: Take 1 capsule (200 mg total) by mouth 2 (two) times daily as needed for cough.    Dispense:   20 capsule    Refill:  0    Order Specific Question:   Supervising Provider    Answer:   Carlota Raspberry, JEFFREY R [[id number]]    PLAN Given 3 week course without improvement, suspect bacterial etiology. Treat with augmentin po bid x 10 days.  Tessalon for cough Encouraged to continue flonase and mucinex Return if worsening or failing to improve Patient encouraged to call clinic with any questions, comments, or concerns.  Maximiano Coss, NP

## 2020-11-13 NOTE — Patient Instructions (Signed)
Ms. Heather Gallegos to meet you  Augmentin twice daily for ten days - finish whole course unless side effects too severe! Benzonatate '200mg'$  twice daily as needed for cough  Continue fluticasone Continue mucinex  Return if symptoms are worsening or failing to improve  Thank you  Denice Paradise

## 2020-11-28 ENCOUNTER — Other Ambulatory Visit: Payer: Self-pay | Admitting: Obstetrics and Gynecology

## 2020-11-28 DIAGNOSIS — Z1231 Encounter for screening mammogram for malignant neoplasm of breast: Secondary | ICD-10-CM

## 2020-11-28 DIAGNOSIS — R300 Dysuria: Secondary | ICD-10-CM | POA: Diagnosis not present

## 2020-11-28 DIAGNOSIS — N390 Urinary tract infection, site not specified: Secondary | ICD-10-CM | POA: Diagnosis not present

## 2020-12-13 ENCOUNTER — Ambulatory Visit: Payer: Medicare PPO | Admitting: Family Medicine

## 2021-01-06 ENCOUNTER — Ambulatory Visit
Admission: RE | Admit: 2021-01-06 | Discharge: 2021-01-06 | Disposition: A | Discharge status: Home / Self Care | Payer: Medicare PPO | Source: Ambulatory Visit | Attending: Obstetrics and Gynecology | Admitting: Obstetrics and Gynecology

## 2021-01-06 ENCOUNTER — Other Ambulatory Visit: Payer: Self-pay

## 2021-01-06 DIAGNOSIS — Z1231 Encounter for screening mammogram for malignant neoplasm of breast: Secondary | ICD-10-CM

## 2021-01-13 ENCOUNTER — Other Ambulatory Visit: Payer: Self-pay | Admitting: Obstetrics and Gynecology

## 2021-01-13 DIAGNOSIS — R928 Other abnormal and inconclusive findings on diagnostic imaging of breast: Secondary | ICD-10-CM

## 2021-01-29 ENCOUNTER — Ambulatory Visit: Payer: Medicare PPO

## 2021-01-29 ENCOUNTER — Ambulatory Visit
Admission: RE | Admit: 2021-01-29 | Discharge: 2021-01-29 | Disposition: A | Discharge status: Home / Self Care | Payer: Medicare PPO | Source: Ambulatory Visit | Attending: Obstetrics and Gynecology | Admitting: Obstetrics and Gynecology

## 2021-01-29 DIAGNOSIS — R922 Inconclusive mammogram: Secondary | ICD-10-CM | POA: Diagnosis not present

## 2021-01-29 DIAGNOSIS — R928 Other abnormal and inconclusive findings on diagnostic imaging of breast: Secondary | ICD-10-CM

## 2021-02-20 ENCOUNTER — Telehealth (INDEPENDENT_AMBULATORY_CARE_PROVIDER_SITE_OTHER): Payer: Medicare PPO | Admitting: Family Medicine

## 2021-02-20 DIAGNOSIS — R0981 Nasal congestion: Secondary | ICD-10-CM | POA: Diagnosis not present

## 2021-02-20 MED ORDER — BENZONATATE 100 MG PO CAPS: ORAL_CAPSULE | ORAL | 0 refills | Status: DC

## 2021-02-20 MED ORDER — AMOXICILLIN-POT CLAVULANATE 875-125 MG PO TABS: 1.0000 | ORAL_TABLET | Freq: Two times a day (BID) | ORAL | 0 refills | Status: DC

## 2021-02-20 NOTE — Patient Instructions (Addendum)
-  I sent the medication(s) we discussed to your pharmacy: Meds ordered this encounter  Medications   amoxicillin-clavulanate (AUGMENTIN) 875-125 MG tablet    Sig: Take 1 tablet by mouth 2 (two) times daily.    Dispense:  20 tablet    Refill:  0   benzonatate (TESSALON PERLES) 100 MG capsule    Sig: 1-2 capsule up to twice daily    Dispense:  30 capsule    Refill:  0    I hope you are feeling better soon!  Seek in person care promptly if your symptoms worsen, new concerns arise or you are not improving with treatment.  It was nice to meet you today. I help [facility] out with telemedicine visits on Tuesdays and Thursdays and am available for visits on those days. If you have any concerns or questions following this visit please schedule a follow up visit with your Primary Care doctor or seek care at a local urgent care clinic to avoid delays in care.

## 2021-02-20 NOTE — Progress Notes (Signed)
Virtual Visit via Video Note  I connected with Heather Gallegos  on 02/20/21 at  3:20 PM EST by a video enabled telemedicine application and verified that I am speaking with the correct person using two identifiers.  Location patient: home, [place] Location provider:work or home office Persons participating in the virtual visit: patient, provider  I discussed the limitations of evaluation and management by telemedicine and the availability of in person appointments. The patient expressed understanding and agreed to proceed.   HPI:  Acute telemedicine visit for sinus issues: -Onset: about 2 weeks ago after her grand kids were sick -Symptoms include: started out with a cold, now developing green and sometimes bloody nasal congestion and a bad cough, having some R maxillary sinus pain -Denies:fever, CP, SOB -Pertinent past medical history: see below -Pertinent medication allergies: No Known Allergies -COVID-19 vaccine status:  Immunization History  Administered Date(s) Administered   Fluad Quad(high Dose 65+) 12/28/2018   Influenza Split 01/23/2020   Influenza,inj,Quad PF,6+ Mos 02/26/2015, 12/05/2015, 02/16/2017, 11/25/2017   Moderna Sars-Covid-2 Vaccination 05/06/2019, 05/07/2019, 06/04/2019   Pneumococcal Conjugate-13 08/21/2016   Pneumococcal Polysaccharide-23 10/29/2010   Tdap 12/23/2009   Zoster Recombinat (Shingrix) 08/21/2017, 11/25/2017   Zoster, Live 12/23/2009    ROS: See pertinent positives and negatives per HPI.  Past Medical History:  Diagnosis Date   Chronic sinusitis    Clostridium difficile infection 11/2015   Elevated cholesterol    Environmental allergies    Fibroid    GERD (gastroesophageal reflux disease)    Hypercholesteremia    Leg edema, left    Low back pain    Uterine prolapse 2010    Past Surgical History:  Procedure Laterality Date   TONSILLECTOMY AND ADENOIDECTOMY       Current Outpatient Medications:    amoxicillin-clavulanate (AUGMENTIN) 875-125 MG  tablet, Take 1 tablet by mouth 2 (two) times daily., Disp: 20 tablet, Rfl: 0   benzonatate (TESSALON PERLES) 100 MG capsule, 1-2 capsule up to twice daily, Disp: 30 capsule, Rfl: 0   b complex vitamins tablet, Take 1 tablet by mouth daily., Disp: , Rfl:    Biotin 1000 MCG tablet, Take 1,000 mcg by mouth daily., Disp: , Rfl:    Calcium Carbonate (CALCIUM 600 PO), Take by mouth daily., Disp: , Rfl:    Cetirizine HCl (ZYRTEC PO), Take by mouth as needed., Disp: , Rfl:    Cholecalciferol (VITAMIN D3) 1000 units CAPS, , Disp: , Rfl:    Coenzyme Q10 (COQ10) 100 MG CAPS, , Disp: , Rfl:    fluticasone (FLONASE) 50 MCG/ACT nasal spray, daily as needed. , Disp: , Rfl:    Glucosamine 500 MG CAPS, Take by mouth., Disp: , Rfl:    Lactobacillus-Inulin (PROBIOTIC DIGESTIVE SUPPORT PO), Take by mouth., Disp: , Rfl:    LUTEIN PO, Take 20 mg by mouth daily., Disp: , Rfl:    omeprazole (PRILOSEC) 20 MG capsule, TAKE 1 CAPSULE(20 MG) BY MOUTH DAILY, Disp: 90 capsule, Rfl: 1   simvastatin (ZOCOR) 40 MG tablet, TAKE 1 TABLET(40 MG) BY MOUTH EVERY EVENING. FOLLOW UP IN DEC, Disp: 90 tablet, Rfl: 0   vitamin C (ASCORBIC ACID) 500 MG tablet, Take 500 mg by mouth daily., Disp: , Rfl:    vitamin E 400 UNIT capsule, Take 400 Units by mouth daily., Disp: , Rfl:   EXAM:  VITALS per patient if applicable:  GENERAL: alert, oriented, appears well and in no acute distress  HEENT: atraumatic, conjunttiva clear, no obvious abnormalities on inspection of external nose  and ears  NECK: normal movements of the head and neck  LUNGS: on inspection no signs of respiratory distress, breathing rate appears normal, no obvious gross SOB, gasping or wheezing  CV: no obvious cyanosis  MS: moves all visible extremities without noticeable abnormality  PSYCH/NEURO: pleasant and cooperative, no obvious depression or anxiety, speech and thought processing grossly intact  ASSESSMENT AND PLAN:  Discussed the following assessment and  plan:  Sinus congestion  -we discussed possible serious and likely etiologies, options for evaluation and workup, limitations of telemedicine visit vs in person visit, treatment, treatment risks and precautions. Pt is agreeable to treatment via telemedicine at this moment. Given duration of symptoms with worsening, discolored drainage and pain query bacterial sinusitis vs other. She has opted for treatment with Augmentin 875 bid x 10 days.  Advised to seek prompt follow up care if worsening, new symptoms arise, or if is not improving with treatment. I discussed the assessment and treatment plan with the patient. The patient was provided an opportunity to ask questions and all were answered. The patient agreed with the plan and demonstrated an understanding of the instructions.     Lucretia Kern, DO

## 2021-02-26 DIAGNOSIS — H35363 Drusen (degenerative) of macula, bilateral: Secondary | ICD-10-CM | POA: Diagnosis not present

## 2021-02-26 DIAGNOSIS — H2513 Age-related nuclear cataract, bilateral: Secondary | ICD-10-CM | POA: Diagnosis not present

## 2021-02-26 DIAGNOSIS — H02834 Dermatochalasis of left upper eyelid: Secondary | ICD-10-CM | POA: Diagnosis not present

## 2021-02-26 DIAGNOSIS — H04123 Dry eye syndrome of bilateral lacrimal glands: Secondary | ICD-10-CM | POA: Diagnosis not present

## 2021-02-26 DIAGNOSIS — H02831 Dermatochalasis of right upper eyelid: Secondary | ICD-10-CM | POA: Diagnosis not present

## 2021-02-26 LAB — HM DIABETES EYE EXAM

## 2021-03-04 ENCOUNTER — Telehealth: Payer: Self-pay | Admitting: Family Medicine

## 2021-03-04 DIAGNOSIS — E785 Hyperlipidemia, unspecified: Secondary | ICD-10-CM

## 2021-03-06 ENCOUNTER — Other Ambulatory Visit: Payer: Self-pay

## 2021-03-06 DIAGNOSIS — E785 Hyperlipidemia, unspecified: Secondary | ICD-10-CM

## 2021-03-06 MED ORDER — SIMVASTATIN 40 MG PO TABS: ORAL_TABLET | ORAL | 0 refills | Status: DC

## 2021-03-06 NOTE — Telephone Encounter (Signed)
A courtesy refill has been sent to the pharmacy.

## 2021-03-06 NOTE — Telephone Encounter (Signed)
Pt has an upcoming appt with Dr. Birdie Riddle on 03/20/21 but is out of medications. Can we please send in a short term supply to get her to her appt.   Pt uses [facility] [place]

## 2021-03-20 ENCOUNTER — Encounter: Payer: Self-pay | Admitting: Family Medicine

## 2021-03-20 ENCOUNTER — Ambulatory Visit (INDEPENDENT_AMBULATORY_CARE_PROVIDER_SITE_OTHER): Payer: Medicare PPO | Admitting: Family Medicine

## 2021-03-20 VITALS — BP 140/82 | HR 76 | Temp 97.7°F | Ht 60.0 in | Wt 149.0 lb

## 2021-03-20 DIAGNOSIS — J329 Chronic sinusitis, unspecified: Secondary | ICD-10-CM | POA: Diagnosis not present

## 2021-03-20 DIAGNOSIS — M542 Cervicalgia: Secondary | ICD-10-CM | POA: Diagnosis not present

## 2021-03-20 DIAGNOSIS — Z23 Encounter for immunization: Secondary | ICD-10-CM

## 2021-03-20 DIAGNOSIS — E785 Hyperlipidemia, unspecified: Secondary | ICD-10-CM

## 2021-03-20 DIAGNOSIS — E119 Type 2 diabetes mellitus without complications: Secondary | ICD-10-CM

## 2021-03-20 LAB — BASIC METABOLIC PANEL
BUN: 21 mg/dL (ref 6–23)
CO2: 25 mEq/L (ref 19–32)
Calcium: 9.6 mg/dL (ref 8.4–10.5)
Chloride: 105 mEq/L (ref 96–112)
Creatinine, Ser: 0.89 mg/dL (ref 0.40–1.20)
GFR: 63.32 mL/min (ref >60.00)
Glucose, Bld: 103 mg/dL — ABNORMAL HIGH (ref 70–99)
Potassium: 4.5 mEq/L (ref 3.5–5.1)
Sodium: 138 mEq/L (ref 135–145)

## 2021-03-20 LAB — CBC WITH DIFFERENTIAL/PLATELET
Basophils Absolute: 0 10*3/uL (ref 0.0–0.1)
Basophils Relative: 0.5 % (ref 0.0–3.0)
Eosinophils Absolute: 0.2 10*3/uL (ref 0.0–0.7)
Eosinophils Relative: 3.7 % (ref 0.0–5.0)
HCT: 41.9 % (ref 36.0–46.0)
Hemoglobin: 14 g/dL (ref 12.0–15.0)
Lymphocytes Relative: 27.3 % (ref 12.0–46.0)
Lymphs Abs: 1.7 10*3/uL (ref 0.7–4.0)
MCHC: 33.3 g/dL (ref 30.0–36.0)
MCV: 89.3 fl (ref 78.0–100.0)
Monocytes Absolute: 0.5 10*3/uL (ref 0.1–1.0)
Monocytes Relative: 8.5 % (ref 3.0–12.0)
Neutro Abs: 3.7 10*3/uL (ref 1.4–7.7)
Neutrophils Relative %: 60 % (ref 43.0–77.0)
Platelets: 278 10*3/uL (ref 150.0–400.0)
RBC: 4.7 Mil/uL (ref 3.87–5.11)
RDW: 13.8 % (ref 11.5–15.5)
WBC: 6.1 10*3/uL (ref 4.0–10.5)

## 2021-03-20 LAB — LIPID PANEL
Cholesterol: 132 mg/dL (ref 0–200)
HDL: 44.4 mg/dL (ref >39.00)
LDL Cholesterol: 55 mg/dL (ref 0–99)
NonHDL: 87.16
Total CHOL/HDL Ratio: 3
Triglycerides: 161 mg/dL — ABNORMAL HIGH (ref 0.0–149.0)
VLDL: 32.2 mg/dL (ref 0.0–40.0)

## 2021-03-20 LAB — HEPATIC FUNCTION PANEL
ALT: 20 U/L (ref 0–35)
AST: 20 U/L (ref 0–37)
Albumin: 4.2 g/dL (ref 3.5–5.2)
Alkaline Phosphatase: 55 U/L (ref 39–117)
Bilirubin, Direct: 0.1 mg/dL (ref 0.0–0.3)
Total Bilirubin: 0.3 mg/dL (ref 0.2–1.2)
Total Protein: 6.6 g/dL (ref 6.0–8.3)

## 2021-03-20 LAB — HEMOGLOBIN A1C: Hgb A1c MFr Bld: 6.9 % — ABNORMAL HIGH (ref 4.6–6.5)

## 2021-03-20 LAB — TSH: TSH: 3.72 u[IU]/mL (ref 0.35–5.50)

## 2021-03-20 MED ORDER — OMEPRAZOLE 20 MG PO CPDR: DELAYED_RELEASE_CAPSULE | ORAL | 1 refills | Status: DC

## 2021-03-20 MED ORDER — SIMVASTATIN 40 MG PO TABS: ORAL_TABLET | ORAL | 0 refills | Status: DC

## 2021-03-20 MED ORDER — TIZANIDINE HCL 4 MG PO TABS: 4.0000 mg | ORAL_TABLET | Freq: Three times a day (TID) | ORAL | 0 refills | Status: DC | PRN

## 2021-03-20 NOTE — Patient Instructions (Addendum)
Schedule your complete physical in 3-4 months We'll notify you of your lab results and make any changes if needed Start ibuprofen as needed for neck pain HEAT will improve the spasm Use the Tizanidine (muscle relaxer) as needed- may cause some drowsiness Continue to work on healthy diet and regular exercise- you look great! We'll call you with your ENT referral Call with any questions or concerns Stay Safe!  Stay Healthy! Happy New Year!!!

## 2021-03-20 NOTE — Progress Notes (Signed)
° °  Subjective:    Patient ID: Heather Gallegos, female    DOB: March 26, 1945, 75 y.o.   MRN: [id number]  HPI DM- ongoing issue for pt.  Attempting to control w/ diet and exercise.  UTD on foot exam, microalbumin.  UTD on eye exam- Dr Katy Fitch, 12/22.  No CP, SOB, HAs, visual changes.  No numbness/tingling of hands/feet.  Hyperlipidemia- chronic problem, on Simvastatin 40mg  daily.  Denies abd pain, N/V  Recurrent sinusitis- pt has completed abx but continues to have green and bloody sinus drainage.  Thankfully pain has resolved.  Would like to see Dr Wilburn Cornelia for re-eval.  Neck pain- occurs on both sides.  Painful to turn head in either direction.  Sxs started ~6 weeks ago.  Changed pillow but that didn't improve things.  No injury, no change in physical activity   Review of Systems For ROS see HPI   This visit occurred during the SARS-CoV-2 public health emergency.  Safety protocols were in place, including screening questions prior to the visit, additional usage of staff PPE, and extensive cleaning of exam room while observing appropriate contact time as indicated for disinfecting solutions.      Objective:   Physical Exam Vitals reviewed.  Constitutional:      General: She is not in acute distress.    Appearance: Normal appearance. She is well-developed. She is not ill-appearing.  HENT:     Head: Normocephalic and atraumatic.  Eyes:     Conjunctiva/sclera: Conjunctivae normal.     Pupils: Pupils are equal, round, and reactive to light.  Neck:     Thyroid: No thyromegaly.  Cardiovascular:     Rate and Rhythm: Normal rate and regular rhythm.     Pulses: Normal pulses.     Heart sounds: Normal heart sounds. No murmur heard. Pulmonary:     Effort: Pulmonary effort is normal. No respiratory distress.     Breath sounds: Normal breath sounds.  Abdominal:     General: There is no distension.     Palpations: Abdomen is soft.     Tenderness: There is no abdominal tenderness.   Musculoskeletal:     Cervical back: Normal range of motion and neck supple.     Right lower leg: No edema.     Left lower leg: No edema.  Lymphadenopathy:     Cervical: No cervical adenopathy.  Skin:    General: Skin is warm and dry.  Neurological:     Mental Status: She is alert and oriented to person, place, and time.  Psychiatric:        Behavior: Behavior normal.          Assessment & Plan:   Neck pain- new to provider, ongoing for pt x6 weeks.  No apparent cause/trigger.  Will start low dose Tizanidine and see if sxs improve.  If no improvement will start PT.  Pt expressed understanding and is in agreement w/ plan.

## 2021-03-26 ENCOUNTER — Telehealth: Payer: Self-pay

## 2021-03-26 NOTE — Telephone Encounter (Signed)
-----   Message from Midge Minium, MD sent at 03/24/2021  9:16 PM EST ----- Labs look great!  No changes at this time

## 2021-03-26 NOTE — Telephone Encounter (Signed)
Pt aware of labs  

## 2021-04-03 ENCOUNTER — Other Ambulatory Visit: Payer: Self-pay | Admitting: Family Medicine

## 2021-04-03 DIAGNOSIS — E785 Hyperlipidemia, unspecified: Secondary | ICD-10-CM

## 2021-04-06 NOTE — Assessment & Plan Note (Signed)
Chronic problem.  On Simvastatin 40mg daily w/o difficulty.  Check labs.  Adjust meds prn  

## 2021-04-06 NOTE — Assessment & Plan Note (Signed)
Chronic problem.  Attempting to control w/ diet and exercise.  UTD on foot exam, eye exam, and microalbumin.  Check labs and determine if medication needed.

## 2021-04-06 NOTE — Assessment & Plan Note (Signed)
Ongoing issue for pt.  Thankfully pain has improved w/ her antibiotics but she continues to have green and bloody drainage.  Refer back to ENT for re-eval.

## 2021-04-17 ENCOUNTER — Other Ambulatory Visit: Payer: Self-pay | Admitting: Family Medicine

## 2021-04-30 ENCOUNTER — Other Ambulatory Visit: Payer: Self-pay | Admitting: Family Medicine

## 2021-05-05 ENCOUNTER — Other Ambulatory Visit: Payer: Self-pay

## 2021-05-05 DIAGNOSIS — E785 Hyperlipidemia, unspecified: Secondary | ICD-10-CM

## 2021-05-05 MED ORDER — SIMVASTATIN 40 MG PO TABS: ORAL_TABLET | ORAL | 0 refills | Status: DC

## 2021-06-02 DIAGNOSIS — Z9889 Other specified postprocedural states: Secondary | ICD-10-CM | POA: Diagnosis not present

## 2021-06-02 DIAGNOSIS — Z9089 Acquired absence of other organs: Secondary | ICD-10-CM | POA: Diagnosis not present

## 2021-06-02 DIAGNOSIS — J343 Hypertrophy of nasal turbinates: Secondary | ICD-10-CM | POA: Diagnosis not present

## 2021-06-02 DIAGNOSIS — J342 Deviated nasal septum: Secondary | ICD-10-CM | POA: Diagnosis not present

## 2021-06-02 DIAGNOSIS — J329 Chronic sinusitis, unspecified: Secondary | ICD-10-CM | POA: Diagnosis not present

## 2021-06-18 ENCOUNTER — Telehealth: Payer: Self-pay

## 2021-06-18 ENCOUNTER — Encounter: Payer: Self-pay | Admitting: Family Medicine

## 2021-06-18 ENCOUNTER — Ambulatory Visit (INDEPENDENT_AMBULATORY_CARE_PROVIDER_SITE_OTHER): Payer: Medicare PPO | Admitting: Family Medicine

## 2021-06-18 VITALS — BP 110/82 | HR 75 | Temp 98.0°F | Resp 16 | Ht 59.0 in | Wt 150.4 lb

## 2021-06-18 DIAGNOSIS — E119 Type 2 diabetes mellitus without complications: Secondary | ICD-10-CM

## 2021-06-18 DIAGNOSIS — M8580 Other specified disorders of bone density and structure, unspecified site: Secondary | ICD-10-CM

## 2021-06-18 DIAGNOSIS — Z0000 Encounter for general adult medical examination without abnormal findings: Secondary | ICD-10-CM

## 2021-06-18 LAB — CBC WITH DIFFERENTIAL/PLATELET
Basophils Absolute: 0 10*3/uL (ref 0.0–0.1)
Basophils Relative: 0.7 % (ref 0.0–3.0)
Eosinophils Absolute: 0.3 10*3/uL (ref 0.0–0.7)
Eosinophils Relative: 5.4 % — ABNORMAL HIGH (ref 0.0–5.0)
HCT: 39.6 % (ref 36.0–46.0)
Hemoglobin: 13.6 g/dL (ref 12.0–15.0)
Lymphocytes Relative: 30.8 % (ref 12.0–46.0)
Lymphs Abs: 1.7 10*3/uL (ref 0.7–4.0)
MCHC: 34.3 g/dL (ref 30.0–36.0)
MCV: 88.7 fl (ref 78.0–100.0)
Monocytes Absolute: 0.5 10*3/uL (ref 0.1–1.0)
Monocytes Relative: 9.8 % (ref 3.0–12.0)
Neutro Abs: 2.9 10*3/uL (ref 1.4–7.7)
Neutrophils Relative %: 53.3 % (ref 43.0–77.0)
Platelets: 270 10*3/uL (ref 150.0–400.0)
RBC: 4.46 Mil/uL (ref 3.87–5.11)
RDW: 13.3 % (ref 11.5–15.5)
WBC: 5.4 10*3/uL (ref 4.0–10.5)

## 2021-06-18 LAB — HEPATIC FUNCTION PANEL
ALT: 21 U/L (ref 0–35)
AST: 21 U/L (ref 0–37)
Albumin: 4.2 g/dL (ref 3.5–5.2)
Alkaline Phosphatase: 53 U/L (ref 39–117)
Bilirubin, Direct: 0.1 mg/dL (ref 0.0–0.3)
Total Bilirubin: 0.4 mg/dL (ref 0.2–1.2)
Total Protein: 6.5 g/dL (ref 6.0–8.3)

## 2021-06-18 LAB — HEMOGLOBIN A1C: Hgb A1c MFr Bld: 7.2 % — ABNORMAL HIGH (ref 4.6–6.5)

## 2021-06-18 LAB — LIPID PANEL
Cholesterol: 144 mg/dL (ref 0–200)
HDL: 42.8 mg/dL (ref >39.00)
LDL Cholesterol: 74 mg/dL (ref 0–99)
NonHDL: 101.2
Total CHOL/HDL Ratio: 3
Triglycerides: 134 mg/dL (ref 0.0–149.0)
VLDL: 26.8 mg/dL (ref 0.0–40.0)

## 2021-06-18 LAB — BASIC METABOLIC PANEL
BUN: 23 mg/dL (ref 6–23)
CO2: 24 mEq/L (ref 19–32)
Calcium: 9.3 mg/dL (ref 8.4–10.5)
Chloride: 105 mEq/L (ref 96–112)
Creatinine, Ser: 1 mg/dL (ref 0.40–1.20)
GFR: 54.96 mL/min — ABNORMAL LOW (ref >60.00)
Glucose, Bld: 136 mg/dL — ABNORMAL HIGH (ref 70–99)
Potassium: 4.4 mEq/L (ref 3.5–5.1)
Sodium: 138 mEq/L (ref 135–145)

## 2021-06-18 LAB — VITAMIN D 25 HYDROXY (VIT D DEFICIENCY, FRACTURES): VITD: 51.18 ng/mL (ref 30.00–100.00)

## 2021-06-18 LAB — TSH: TSH: 4.3 u[IU]/mL (ref 0.35–5.50)

## 2021-06-18 NOTE — Assessment & Plan Note (Signed)
Pt's PE WNL.  UTD on mammo, colonoscopy, PNA, shingles, flu.  Check labs.  Anticipatory guidance provided.  ?

## 2021-06-18 NOTE — Patient Instructions (Signed)
Follow up in 3-4 months to recheck diabetes ?We'll notify you of your lab results and make any changes if needed ?Continue to work on healthy diet and regular exercise- you can do it!! ?[place]!!!! ?Call with any questions or concerns ?Happy Spring!!! ?

## 2021-06-18 NOTE — Progress Notes (Signed)
? ?  Subjective:  ? ? Patient ID: Heather Gallegos, female    DOB: Jun 04, 1945, 76 y.o.   MRN: [id number] ? ?HPI ?CPE- UTD on foot exam, microalbumin, mammo, colonoscopy, PNA vaccines.  UTD on eye exam ? ?Patient Care Team  ?  Relationship Specialty Notifications Start End  ?Midge Minium, MD PCP - General Family Medicine  08/29/15   ?Juanita Craver, MD Consulting Physician Gastroenterology  08/25/17   ?Salvadore Dom, MD Consulting Physician Obstetrics and Gynecology  08/25/17   ?[employer] Consulting Physician   09/04/20   ?  ?Health Maintenance  ?Topic Date Due  ? COVID-19 Vaccine (5 - Booster) 05/31/2020  ? OPHTHALMOLOGY EXAM  10/10/2020  ? TETANUS/TDAP  11/13/2021 (Originally 12/24/2019)  ? FOOT EXAM  09/04/2021  ? URINE MICROALBUMIN  09/04/2021  ? HEMOGLOBIN A1C  09/18/2021  ? MAMMOGRAM  01/06/2022  ? COLONOSCOPY (Pts 45-78yr Insurance coverage will need to be confirmed)  03/01/2023  ? Pneumonia Vaccine 76 Years old  Completed  ? INFLUENZA VACCINE  Completed  ? DEXA SCAN  Completed  ? Hepatitis C Screening  Completed  ? Zoster Vaccines- Shingrix  Completed  ? HPV VACCINES  Aged Out  ?  ? ? ?Review of Systems ?Patient reports no vision/ hearing changes, adenopathy,fever, weight change,  persistant/recurrent hoarseness , swallowing issues, chest pain, palpitations, edema, persistant/recurrent cough, hemoptysis, dyspnea (rest/exertional/paroxysmal nocturnal), gastrointestinal bleeding (melena, rectal bleeding), abdominal pain, significant heartburn, bowel changes, GU symptoms (dysuria, hematuria, incontinence), Gyn symptoms (abnormal  bleeding, pain),  syncope, focal weakness, memory loss, numbness & tingling, skin/hair/nail changes, abnormal bruising or bleeding, anxiety, or depression.  ?   ?Objective:  ? Physical Exam ?General Appearance:    Alert, cooperative, no distress, appears stated age  ?Head:    Normocephalic, without obvious abnormality, atraumatic  ?Eyes:    PERRL, conjunctiva/corneas  clear, EOM's intact, fundi  ?  benign, both eyes  ?Ears:    Normal TM's and external ear canals, both ears  ?Nose:   Nares normal, septum midline, mucosa normal, no drainage  ?  or sinus tenderness  ?Throat:   Lips, mucosa, and tongue normal; teeth and gums normal  ?Neck:   Supple, symmetrical, trachea midline, no adenopathy;  ?  Thyroid: no enlargement/tenderness/nodules  ?Back:     Symmetric, no curvature, ROM normal, no CVA tenderness  ?Lungs:     Clear to auscultation bilaterally, respirations unlabored  ?Chest Wall:    No tenderness or deformity  ? Heart:    Regular rate and rhythm, S1 and S2 normal, no murmur, rub ?  or gallop  ?Breast Exam:    Deferred to mammo  ?Abdomen:     Soft, non-tender, bowel sounds active all four quadrants,  ?  no masses, no organomegaly  ?Genitalia:    Deferred to GYN  ?Rectal:    ?Extremities:   Extremities normal, atraumatic, no cyanosis or edema  ?Pulses:   2+ and symmetric all extremities  ?Skin:   Skin color, texture, turgor normal, no rashes or lesions  ?Lymph nodes:   Cervical, supraclavicular, and axillary nodes normal  ?Neurologic:   CNII-XII intact, normal strength, sensation and reflexes  ?  throughout  ?  ? ? ? ?   ?Assessment & Plan:  ? ? ?

## 2021-06-18 NOTE — Assessment & Plan Note (Signed)
Ongoing issue for pt.  UTD on foot exam, eye exam, and microalbumin.  Has been able to control w/ diet and has not needed medication in the past.  Check labs and determine if changes are needed ?

## 2021-06-18 NOTE — Telephone Encounter (Signed)
-----   Message from Midge Minium, MD sent at 06/18/2021 11:23 AM EDT ----- ?Labs look good but A1C has increased to 7.2% (up from 6.9%)  This will improve w/ low carb diet and regular physical activity.  I don't want to add medicine just yet but we will continue to follow closely. ?

## 2021-06-18 NOTE — Assessment & Plan Note (Signed)
UTD on DEXA.  Check labs and replete prn. ?

## 2021-06-18 NOTE — Telephone Encounter (Signed)
Spoke with patient she is aware of labs ?

## 2021-07-07 ENCOUNTER — Other Ambulatory Visit: Payer: Self-pay

## 2021-07-07 ENCOUNTER — Telehealth: Payer: Self-pay

## 2021-07-07 DIAGNOSIS — E785 Hyperlipidemia, unspecified: Secondary | ICD-10-CM

## 2021-07-07 MED ORDER — SIMVASTATIN 40 MG PO TABS: ORAL_TABLET | ORAL | 0 refills | Status: DC

## 2021-07-07 NOTE — Telephone Encounter (Signed)
MEDICATION:simvastatin (ZOCOR) 40 MG tablet ? ?PHARMACY: [facility] [phone] - [place], [place] - [address] ? ?Comments: Patient is completely out. Asking if we can do a 90 day supply vs. 30.  ? ?**Let patient know to contact pharmacy at the end of the day to make sure medication is ready. ** ? ?** Please notify patient to allow 48-72 hours to process** ? ?**Encourage patient to contact the pharmacy for refills or they can request refills through [facility]** ? ? ?

## 2021-07-07 NOTE — Telephone Encounter (Signed)
Rx sent to pharmacy for 90 days ?

## 2021-08-13 DIAGNOSIS — J322 Chronic ethmoidal sinusitis: Secondary | ICD-10-CM | POA: Diagnosis not present

## 2021-08-13 DIAGNOSIS — J342 Deviated nasal septum: Secondary | ICD-10-CM | POA: Diagnosis not present

## 2021-08-13 DIAGNOSIS — J329 Chronic sinusitis, unspecified: Secondary | ICD-10-CM | POA: Diagnosis not present

## 2021-08-13 DIAGNOSIS — J343 Hypertrophy of nasal turbinates: Secondary | ICD-10-CM | POA: Diagnosis not present

## 2021-08-13 DIAGNOSIS — J323 Chronic sphenoidal sinusitis: Secondary | ICD-10-CM | POA: Diagnosis not present

## 2021-08-29 ENCOUNTER — Encounter: Payer: Self-pay | Admitting: Family Medicine

## 2021-08-29 ENCOUNTER — Ambulatory Visit (INDEPENDENT_AMBULATORY_CARE_PROVIDER_SITE_OTHER): Payer: Medicare PPO | Admitting: Family Medicine

## 2021-08-29 VITALS — BP 134/70 | HR 84 | Temp 98.9°F | Resp 16 | Ht 59.0 in | Wt 151.5 lb

## 2021-08-29 DIAGNOSIS — J019 Acute sinusitis, unspecified: Secondary | ICD-10-CM | POA: Diagnosis not present

## 2021-08-29 DIAGNOSIS — B9689 Other specified bacterial agents as the cause of diseases classified elsewhere: Secondary | ICD-10-CM | POA: Diagnosis not present

## 2021-08-29 DIAGNOSIS — M25532 Pain in left wrist: Secondary | ICD-10-CM

## 2021-08-29 MED ORDER — DICLOFENAC SODIUM 1 % EX GEL: 2.0000 g | Freq: Four times a day (QID) | CUTANEOUS | 0 refills | Status: DC

## 2021-08-29 MED ORDER — AMOXICILLIN 875 MG PO TABS: 875.0000 mg | ORAL_TABLET | Freq: Two times a day (BID) | ORAL | 0 refills | Status: AC

## 2021-08-29 NOTE — Patient Instructions (Signed)
Follow up as needed or as scheduled START the Amoxicillin twice daily- take w/ food Apply the Voltaren gel as directed for wrist pain ICE!!! Go to the new [facility] office on [address] for complete evaluation Call with any questions or concerns Hang in there!!!

## 2021-08-29 NOTE — Progress Notes (Signed)
Subjective:    Patient ID: Heather Gallegos, female    DOB: May 12, 1945, 76 y.o.   MRN: [id number]  HPI URI- pt reports 'it seems to be settling in my sinuses and my chest'.  Cough is loose, intermittently productive.  Sxs started ~10 days ago.  + sinus pain/pressure- swelling noted under eyes.  No ear pain.  No fevers.  'i just feel really crummy'.  + sick contacts- granddaughters are sick.  Home COVID test (-).  Mild wheezing.  No SOB.  + HA.  No tooth pain.  L wrist pain- pt is unaware of injury.  Area is swollen.  Certain movements are more painful.  Sxs started ~3 weeks ago.  Will have some intermittent redness of CMC joint.  Pt is R hand dominant.  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   Review of Systems For ROS see HPI     Objective:   Physical Exam Vitals reviewed.  Constitutional:      General: She is not in acute distress.    Appearance: Normal appearance. She is well-developed. She is not ill-appearing.  HENT:     Head: Normocephalic and atraumatic.     Right Ear: Tympanic membrane normal.     Left Ear: Tympanic membrane normal.     Nose: Mucosal edema and rhinorrhea present.     Right Sinus: Maxillary sinus tenderness and frontal sinus tenderness present.     Left Sinus: Maxillary sinus tenderness and frontal sinus tenderness present.     Mouth/Throat:     Pharynx: Uvula midline. Posterior oropharyngeal erythema present. No oropharyngeal exudate.  Eyes:     Conjunctiva/sclera: Conjunctivae normal.     Pupils: Pupils are equal, round, and reactive to light.  Cardiovascular:     Rate and Rhythm: Normal rate and regular rhythm.     Heart sounds: Normal heart sounds.  Pulmonary:     Effort: Pulmonary effort is normal. No respiratory distress.     Breath sounds: Normal breath sounds. No wheezing.  Musculoskeletal:        General: Swelling (L wrist) and tenderness (TTP over L CMC joint) present. No deformity.     Cervical back:  Normal range of motion and neck supple.     Comments: Pain w/ L wrist extension>flexion  Lymphadenopathy:     Cervical: No cervical adenopathy.  Skin:    General: Skin is warm and dry.  Neurological:     General: No focal deficit present.     Mental Status: She is alert and oriented to person, place, and time.     Cranial Nerves: No cranial nerve deficit.     Motor: No weakness.     Coordination: Coordination normal.  Psychiatric:        Mood and Affect: Mood normal.        Behavior: Behavior normal.        Thought Content: Thought content normal.           Assessment & Plan:  Bacterial sinusitis- new.  Pt's sxs and PE consistent w/ infxn and after 10 days, this has likely evolved from viral illness to bacterial infxn.  Start abx.  Reviewed supportive care and red flags that should prompt return.  Pt expressed understanding and is in agreement w/ plan.   L wrist pain- new.  Pt has mild swelling of L wrist.  Pain w/ extension>flexion.  TTP over [facility] joint.  Will start Voltaren gel and she  is to see ortho for complete evaluation and tx.  Pt expressed understanding and is in agreement w/ plan.

## 2021-09-15 DIAGNOSIS — M25532 Pain in left wrist: Secondary | ICD-10-CM | POA: Diagnosis not present

## 2021-09-25 ENCOUNTER — Other Ambulatory Visit: Payer: Self-pay | Admitting: Family Medicine

## 2021-09-25 DIAGNOSIS — E785 Hyperlipidemia, unspecified: Secondary | ICD-10-CM

## 2021-09-29 ENCOUNTER — Telehealth: Payer: Self-pay | Admitting: Family Medicine

## 2021-09-29 NOTE — Telephone Encounter (Signed)
Left message for patient to call back and schedule Medicare Annual Wellness Visit (AWV.   Please offer to do virtually or by telephone.  Left office number and my jabber [phone].  Last AWV:09/16/2020  Please schedule at anytime with Nurse Health Advisor.

## 2021-10-13 ENCOUNTER — Ambulatory Visit: Payer: Medicare PPO | Admitting: Family Medicine

## 2021-10-23 ENCOUNTER — Encounter: Payer: Self-pay | Admitting: Family Medicine

## 2021-10-23 ENCOUNTER — Ambulatory Visit (INDEPENDENT_AMBULATORY_CARE_PROVIDER_SITE_OTHER): Payer: Medicare PPO | Admitting: Family Medicine

## 2021-10-23 ENCOUNTER — Other Ambulatory Visit: Payer: Self-pay

## 2021-10-23 VITALS — BP 124/70 | HR 80 | Temp 97.8°F | Resp 18 | Ht 59.0 in | Wt 151.8 lb

## 2021-10-23 DIAGNOSIS — E119 Type 2 diabetes mellitus without complications: Secondary | ICD-10-CM | POA: Diagnosis not present

## 2021-10-23 LAB — BASIC METABOLIC PANEL
BUN: 20 mg/dL (ref 6–23)
CO2: 26 mEq/L (ref 19–32)
Calcium: 9.2 mg/dL (ref 8.4–10.5)
Chloride: 105 mEq/L (ref 96–112)
Creatinine, Ser: 0.95 mg/dL (ref 0.40–1.20)
GFR: 58.31 mL/min — ABNORMAL LOW (ref >60.00)
Glucose, Bld: 151 mg/dL — ABNORMAL HIGH (ref 70–99)
Potassium: 4.2 mEq/L (ref 3.5–5.1)
Sodium: 139 mEq/L (ref 135–145)

## 2021-10-23 LAB — HEMOGLOBIN A1C: Hgb A1c MFr Bld: 8 % — ABNORMAL HIGH (ref 4.6–6.5)

## 2021-10-23 MED ORDER — METFORMIN HCL 500 MG PO TABS: 500.0000 mg | ORAL_TABLET | Freq: Two times a day (BID) | ORAL | 3 refills | Status: DC

## 2021-10-23 NOTE — Assessment & Plan Note (Signed)
Chronic problem.  She has been able to manage w/ diet and exercise.  Foot exam done today.  UTD on eye exam.  Pt reports feeling good and she is asymptomatic at this time.  Check labs and determine if medications are needed.  Will continue to follow.

## 2021-10-23 NOTE — Progress Notes (Signed)
   Subjective:    Patient ID: Heather Gallegos, female    DOB: [birth year], 76 y.o.   MRN: [id number]  HPI DM- chronic problem, currently diet controlled.  Due for foot exam, UTD on eye exam.  Due for microalbumin.  Last A1C 7.2%  Pt reports feeling good.  No CP, SOB, HAs, visual changes, abd pain, N/V.  No numbness/tingling of hands/feet.  No sores/blisters/lesions on feet.   Review of Systems For ROS see HPI     Objective:   Physical Exam Vitals reviewed.  Constitutional:      General: She is not in acute distress.    Appearance: She is well-developed.  HENT:     Head: Normocephalic and atraumatic.  Eyes:     Conjunctiva/sclera: Conjunctivae normal.     Pupils: Pupils are equal, round, and reactive to light.  Neck:     Thyroid: No thyromegaly.  Cardiovascular:     Rate and Rhythm: Normal rate and regular rhythm.     Pulses: Normal pulses.     Heart sounds: Normal heart sounds. No murmur heard. Pulmonary:     Effort: Pulmonary effort is normal. No respiratory distress.     Breath sounds: Normal breath sounds.  Abdominal:     General: There is no distension.     Palpations: Abdomen is soft.     Tenderness: There is no abdominal tenderness.  Musculoskeletal:     Cervical back: Normal range of motion and neck supple.     Right lower leg: No edema.     Left lower leg: No edema.  Lymphadenopathy:     Cervical: No cervical adenopathy.  Skin:    General: Skin is warm and dry.  Neurological:     Mental Status: She is alert and oriented to person, place, and time.  Psychiatric:        Behavior: Behavior normal.           Assessment & Plan:

## 2021-10-23 NOTE — Patient Instructions (Addendum)
Follow up in 3-4 months to recheck sugars We'll notify you of your lab results and make any changes if needed Continue to work on healthy diet and regular exercise- you can do it!! Call with any questions or concerns Stay Safe!  Stay Healthy! Enjoy the rest of your summer!!!

## 2021-10-23 NOTE — Progress Notes (Signed)
Spoke w/ pt and advised of lab results . Metformin 500 mg #60 has been sent to the pharmacy

## 2021-11-06 ENCOUNTER — Telehealth (INDEPENDENT_AMBULATORY_CARE_PROVIDER_SITE_OTHER): Payer: Medicare PPO | Admitting: Family Medicine

## 2021-11-06 ENCOUNTER — Encounter: Payer: Self-pay | Admitting: Family Medicine

## 2021-11-06 DIAGNOSIS — U071 COVID-19: Secondary | ICD-10-CM

## 2021-11-06 MED ORDER — MOLNUPIRAVIR EUA 200MG CAPSULE: 4.0000 | ORAL_CAPSULE | Freq: Two times a day (BID) | ORAL | 0 refills | Status: AC

## 2021-11-06 NOTE — Progress Notes (Signed)
Virtual Visit via Video   I connected with patient on 11/06/21 at 11:20 AM EDT by a video enabled telemedicine application and verified that I am speaking with the correct person using two identifiers.  Location patient: Home Location provider: [facility] Persons participating in the virtual visit: Patient, Provider, [place] Marcille Blanco C)  I discussed the limitations of evaluation and management by telemedicine and the availability of in person appointments. The patient expressed understanding and agreed to proceed.  Subjective:   HPI:   COVID- pt started feeling badly Tuesday night.  Yesterday's COVID test was negative.  Was + this morning.  + body aches, chest congestion, low grade fever, sinus pain/pressure.  + sick contact at work.  Denies N/V, SOB.  ROS:   See pertinent positives and negatives per HPI.  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   Gastroesophageal reflux disease 05/17/2020   Internal hemorrhoids 05/17/2020   Personal history of colonic polyps 05/17/2020   Diverticulitis 05/17/2020   Overweight (BMI 25.0-29.9) 09/13/2019   Osteopenia 09/13/2019   Diet-controlled diabetes mellitus ([place]) 09/13/2019   Indigestion 02/25/2018   Physical exam 08/21/2016   Change of skin color 09/05/2015   Localized swelling of lower leg 08/28/2015   Hyperlipidemia    Uterine prolapse     Social History   Tobacco Use   Smoking status: Never   Smokeless tobacco: Never  Substance Use Topics   Alcohol use: No    Alcohol/week: 0.0 standard drinks of alcohol    Current Outpatient Medications:    b complex vitamins tablet, Take 1 tablet by mouth daily., Disp: , Rfl:    benzonatate (TESSALON PERLES) 100 MG capsule, 1-2 capsule up to twice daily, Disp: 30 capsule, Rfl: 0   Biotin 1000 MCG tablet, Take 1,000 mcg by mouth daily., Disp: , Rfl:    Calcium Carbonate (CALCIUM 600  PO), Take by mouth daily., Disp: , Rfl:    Cetirizine HCl (ZYRTEC PO), Take by mouth as needed., Disp: , Rfl:    Cholecalciferol (VITAMIN D3) 1000 units CAPS, , Disp: , Rfl:    Coenzyme Q10 (COQ10) 100 MG CAPS, , Disp: , Rfl:    diclofenac Sodium (VOLTAREN) 1 % GEL, Apply 2 g topically 4 (four) times daily., Disp: 100 g, Rfl: 0   fluticasone (FLONASE) 50 MCG/ACT nasal spray, daily as needed. , Disp: , Rfl:    Glucosamine 500 MG CAPS, Take by mouth., Disp: , Rfl:    Lactobacillus-Inulin (PROBIOTIC DIGESTIVE SUPPORT PO), Take by mouth., Disp: , Rfl:    LUTEIN PO, Take 20 mg by mouth daily., Disp: , Rfl:    metFORMIN (GLUCOPHAGE) 500 MG tablet, Take 1 tablet (500 mg total) by mouth 2 (two) times daily with a meal., Disp: 60 tablet, Rfl: 3   omeprazole (PRILOSEC) 20 MG capsule, TAKE 1 CAPSULE(20 MG) BY MOUTH DAILY, Disp: 90 capsule, Rfl: 1   simvastatin (ZOCOR) 40 MG tablet, TAKE 1 TABLET(40 MG) BY MOUTH EVERY EVENING. FOLLOW UP IN DEC, Disp: 90 tablet, Rfl: 0   vitamin C (ASCORBIC ACID) 500 MG tablet, Take 500 mg by mouth daily., Disp: , Rfl:    vitamin E 400 UNIT capsule, Take 400 Units by mouth daily., Disp: , Rfl:   No Known Allergies  Objective:   LMP 03/23/1998 (LMP Unknown)  AAOx3, NAD NCAT, EOMI No obvious CN deficits Coloring WNL Pt is able to speak clearly, coherently  without shortness of breath or increased work of breathing.  Thought process is linear.  Mood is appropriate.   Assessment and Plan:   COVID- new.  Pt tested + this morning after developing sxs on Tuesday night.  She has known exposure at work.  Start Molnupiravir, encouraged fluids, rest, tylenol/ibuprofen, and Vit D, Vit C, and Zinc to boost immune system.  Reviewed red flags that should prompt return.  Pt expressed understanding and is in agreement w/ plan.    Heather Asa, MD 11/06/2021

## 2021-11-13 ENCOUNTER — Ambulatory Visit (INDEPENDENT_AMBULATORY_CARE_PROVIDER_SITE_OTHER): Payer: Medicare PPO

## 2021-11-13 DIAGNOSIS — Z0000 Encounter for general adult medical examination without abnormal findings: Secondary | ICD-10-CM | POA: Diagnosis not present

## 2021-11-13 NOTE — Progress Notes (Signed)
Subjective:   Iyanni Hepp is a 76 y.o. female who presents for Medicare Annual (Subsequent) preventive examination.   I connected with Somara Frymire  today by telephone and verified that I am speaking with the correct person using two identifiers. Location patient: home Location provider: work Persons participating in the virtual visit: patient, provider.   I discussed the limitations, risks, security and privacy concerns of performing an evaluation and management service by telephone and the availability of in person appointments. I also discussed with the patient that there may be a patient responsible charge related to this service. The patient expressed understanding and verbally consented to this telephonic visit.    Interactive audio and video telecommunications were attempted between this provider and patient, however failed, due to patient having technical difficulties OR patient did not have access to video capability.  We continued and completed visit with audio only.      Review of Systems     Cardiac Risk Factors include: advanced age (>25mn, >>71women);diabetes mellitus     Objective:    Today's Vitals   There is no height or weight on file to calculate BMI.     11/13/2021    9:14 AM 09/16/2020    1:35 PM 08/25/2017    8:18 AM 06/11/2015   10:16 AM  Advanced Directives  Does Patient Have a Medical Advance Directive? Yes No No No  Type of Advance Directive Living will     Would patient like information on creating a medical advance directive?  No - Patient declined No - Patient declined No - patient declined information    Current Medications (verified) Outpatient Encounter Medications as of 11/13/2021  Medication Sig   b complex vitamins tablet Take 1 tablet by mouth daily.   benzonatate (TESSALON PERLES) 100 MG capsule 1-2 capsule up to twice daily   Biotin 1000 MCG tablet Take 1,000 mcg by mouth daily.   Calcium Carbonate (CALCIUM 600 PO) Take by mouth  daily.   Cetirizine HCl (ZYRTEC PO) Take by mouth as needed.   Cholecalciferol (VITAMIN D3) 1000 units CAPS    Coenzyme Q10 (COQ10) 100 MG CAPS    diclofenac Sodium (VOLTAREN) 1 % GEL Apply 2 g topically 4 (four) times daily.   fluticasone (FLONASE) 50 MCG/ACT nasal spray daily as needed.    Glucosamine 500 MG CAPS Take by mouth.   Lactobacillus-Inulin (PROBIOTIC DIGESTIVE SUPPORT PO) Take by mouth.   LUTEIN PO Take 20 mg by mouth daily.   metFORMIN (GLUCOPHAGE) 500 MG tablet Take 1 tablet (500 mg total) by mouth 2 (two) times daily with a meal.   omeprazole (PRILOSEC) 20 MG capsule TAKE 1 CAPSULE(20 MG) BY MOUTH DAILY   simvastatin (ZOCOR) 40 MG tablet TAKE 1 TABLET(40 MG) BY MOUTH EVERY EVENING. FOLLOW UP IN DEC   vitamin C (ASCORBIC ACID) 500 MG tablet Take 500 mg by mouth daily.   vitamin E 400 UNIT capsule Take 400 Units by mouth daily.   No facility-administered encounter medications on file as of 11/13/2021.    Allergies (verified) Patient has no known allergies.   History: Past Medical History:  Diagnosis Date   Chronic sinusitis    Clostridium difficile infection 11/2015   Elevated cholesterol    Environmental allergies    Fibroid    GERD (gastroesophageal reflux disease)    Hypercholesteremia    Leg edema, left    Low back pain    Uterine prolapse 2010   Past Surgical History:  Procedure  Laterality Date   TONSILLECTOMY AND ADENOIDECTOMY     Family History  Problem Relation Age of Onset   Cancer Mother 62       Stomach cancer   Hypertension Brother    Diabetes Brother        Type II   Osteoporosis Maternal Aunt    Hypertension Father    Stroke Father    Social History   Socioeconomic History   Marital status: Married    Spouse name: Not on file   Number of children: Not on file   Years of education: Not on file   Highest education level: Not on file  Occupational History   Not on file  Tobacco Use   Smoking status: Never   Smokeless tobacco: Never   Vaping Use   Vaping Use: Never used  Substance and Sexual Activity   Alcohol use: No    Alcohol/week: 0.0 standard drinks of alcohol   Drug use: No   Sexual activity: Yes    Partners: Male    Birth control/protection: Post-menopausal  Other Topics Concern   Not on file  Social History Narrative   Not on file   Social Determinants of Health   Financial Resource Strain: Low Risk  (11/13/2021)   Overall Financial Resource Strain (CARDIA)    Difficulty of Paying Living Expenses: Not hard at all  Food Insecurity: No Food Insecurity (11/13/2021)   Hunger Vital Sign    Worried About Running Out of Food in the Last Year: Never true    [place] in the Last Year: Never true  Transportation Needs: No Transportation Needs (11/13/2021)   PRAPARE - Hydrologist (Medical): No    Lack of Transportation (Non-Medical): No  Physical Activity: Insufficiently Active (11/13/2021)   Exercise Vital Sign    Days of Exercise per Week: 2 days    Minutes of Exercise per Session: 60 min  Stress: No Stress Concern Present (11/13/2021)   [place]    Feeling of Stress : Not at all  Social Connections: [place] (11/13/2021)   Social Connection and Isolation Panel [NHANES]    Frequency of Communication with Friends and Family: Three times a week    Frequency of Social Gatherings with Friends and Family: Three times a week    Attends Religious Services: More than 4 times per year    Active Member of Clubs or Organizations: Yes    Attends Music therapist: Not on file    Marital Status: Married    Tobacco Counseling Counseling given: Not Answered   Clinical Intake:  Pre-visit preparation completed: Yes  Pain : No/denies pain     Nutritional Risks: None Diabetes: Yes CBG done?: No Did pt. bring in CBG monitor from home?: No  How often do you need to have someone help you  when you read instructions, pamphlets, or other written materials from your doctor or pharmacy?: 1 - Never What is the last grade level you completed in school?: college  Diabetic?yes  Nutrition Risk Assessment:  Has the patient had any N/V/D within the last 2 months?  No  Does the patient have any non-healing wounds?  No  Has the patient had any unintentional weight loss or weight gain?  No   Diabetes:  Is the patient diabetic?  Yes  If diabetic, was a CBG obtained today?  No  Did the patient bring in their glucometer from  home?  No  How often do you monitor your CBG's? Only in office .   Financial Strains and Diabetes Management:  Are you having any financial strains with the device, your supplies or your medication? No .  Does the patient want to be seen by Chronic Care Management for management of their diabetes?  No  Would the patient like to be referred to a Nutritionist or for Diabetic Management?  No   Diabetic Exams:  Diabetic Eye Exam: Completed 05/2021 Diabetic Foot Exam: Overdue, Pt has been advised about the importance in completing this exam. Pt is scheduled for diabetic foot exam on next office visit .   Interpreter Needed?: No  Information entered by :: L.Wilson,LPN   Activities of Daily Living    11/13/2021    9:19 AM 06/18/2021    7:43 AM  In your present state of health, do you have any difficulty performing the following activities:  Hearing? 0 0  Vision? 0 0  Difficulty concentrating or making decisions? 0 0  Walking or climbing stairs? 0 0  Dressing or bathing? 0 0  Doing errands, shopping? 0 0  Preparing Food and eating ? N   Using the Toilet? N   In the past six months, have you accidently leaked urine? N   Do you have problems with loss of bowel control? N   Managing your Medications? N   Managing your Finances? N   Housekeeping or managing your Housekeeping? N     Patient Care Team: Midge Minium, MD as PCP - General (Family  Medicine) Juanita Craver, MD as Consulting Physician (Gastroenterology) Salvadore Dom, MD as Consulting Physician (Obstetrics and Gynecology) [facility] as Consulting Physician  Indicate any recent Medical Services you may have received from other than [facility] providers in the past year (date may be approximate).     Assessment:   This is a routine wellness examination for Julietta.  Hearing/Vision screen Vision Screening - Comments:: Annual eye exams   Dietary issues and exercise activities discussed: Current Exercise Habits: Home exercise routine, Type of exercise: walking;strength training/weights, Time (Minutes): 60, Frequency (Times/Week): 4, Weekly Exercise (Minutes/Week): 240, Intensity: Mild, Exercise limited by: None identified   Goals Addressed             This Visit's Progress    Increase physical activity   On track    Increase activity by walking more.        Depression Screen    11/13/2021    9:15 AM 11/13/2021    9:13 AM 10/23/2021    8:26 AM 08/29/2021   12:35 PM 06/18/2021    7:47 AM 03/20/2021   10:34 AM 11/13/2020    9:57 AM  PHQ 2/9 Scores  PHQ - 2 Score 0 0 0 0 0 0 0  PHQ- 9 Score   0 0 0 0 0    Fall Risk    11/13/2021    9:15 AM 10/23/2021    8:26 AM 08/29/2021   12:36 PM 06/18/2021    7:47 AM 03/20/2021   10:34 AM  Fall Risk   Falls in the past year? 0 0 0 0 0  Number falls in past yr: 0 0 0    Injury with Fall? 0 0 0    Risk for fall due to :  No Fall Risks No Fall Risks No Fall Risks   Follow up Falls evaluation completed;Education provided Falls evaluation completed Falls evaluation completed Falls evaluation completed  FALL RISK PREVENTION PERTAINING TO THE HOME:  Any stairs in or around the home? Yes  If so, are there any without handrails? No  Home free of loose throw rugs in walkways, pet beds, electrical cords, etc? Yes  Adequate lighting in your home to reduce risk of falls? Yes   ASSISTIVE DEVICES UTILIZED TO  PREVENT FALLS:  Life alert? No  Use of a cane, walker or w/c? No  Grab bars in the bathroom? Yes  Shower chair or bench in shower? No  Elevated toilet seat or a handicapped toilet? No   Cognitive Function:  Normal cognitive status assessed by telephone conversation  by this Nurse Health Advisor. No abnormalities found.      08/25/2017    8:19 AM  MMSE - Mini Mental State Exam  Orientation to time 5  Orientation to Place 5  Registration 3  Attention/ Calculation 5  Recall 3  Language- name 2 objects 2  Language- repeat 1  Language- follow 3 step command 3  Language- read & follow direction 1  Write a sentence 1  Copy design 1  Total score 30        11/13/2021    9:18 AM  6CIT Screen  What Year? 0 points  What month? 0 points  What time? 0 points  Count back from 20 0 points  Months in reverse 0 points  Repeat phrase 0 points  Total Score 0 points    Immunizations Immunization History  Administered Date(s) Administered   Fluad Quad(high Dose 65+) 12/28/2018, 03/20/2021   Influenza Split 01/23/2020   Influenza,inj,Quad PF,6+ Mos 02/26/2015, 12/05/2015, 02/16/2017, 11/25/2017   MODERNA COVID-19 SARS-COV-2 PEDS BIVALENT BOOSTER 6Y-11Y 08/29/2020   Moderna SARS-COV2 Booster Vaccination 08/29/2020   Moderna Sars-Covid-2 Vaccination 05/07/2019, 06/04/2019, 04/05/2020   Pneumococcal Conjugate-13 08/21/2016   Pneumococcal Polysaccharide-23 10/29/2010   Tdap 12/23/2009   Zoster Recombinat (Shingrix) 08/21/2017, 11/25/2017   Zoster, Live 12/23/2009    TDAP status: Due, Education has been provided regarding the importance of this vaccine. Advised may receive this vaccine at local pharmacy or Health Dept. Aware to provide a copy of the vaccination record if obtained from local pharmacy or Health Dept. Verbalized acceptance and understanding.  Flu Vaccine status: Up to date  Pneumococcal vaccine status: Up to date  Covid-19 vaccine status: Completed  vaccines  Qualifies for Shingles Vaccine? Yes   Zostavax completed No   Shingrix Completed?: Yes  Screening Tests Health Maintenance  Topic Date Due   COVID-19 Vaccine (5 - Moderna series) 12/29/2020   INFLUENZA VACCINE  10/21/2021   TETANUS/TDAP  11/13/2021 (Originally 12/24/2019)   URINE MICROALBUMIN  11/23/2021 (Originally 09/04/2021)   MAMMOGRAM  01/06/2022   OPHTHALMOLOGY EXAM  02/26/2022   HEMOGLOBIN A1C  04/25/2022   FOOT EXAM  10/24/2022   COLONOSCOPY (Pts 45-17yr Insurance coverage will need to be confirmed)  03/01/2023   Pneumonia Vaccine 76 Years old  Completed   DEXA SCAN  Completed   Hepatitis C Screening  Completed   Zoster Vaccines- Shingrix  Completed   HPV VACCINES  Aged Out    Health Maintenance  Health Maintenance Due  Topic Date Due   COVID-19 Vaccine (5 - Moderna series) 12/29/2020   INFLUENZA VACCINE  10/21/2021    Colorectal cancer screening: Type of screening: Colonoscopy. Completed 02/28/2018. Repeat every 5 years  Mammogram status: Completed 01/06/2021. Repeat every year  Bone Density status: Completed 11/14/2019. Results reflect: Bone density results: OSTEOPENIA. Repeat every 5 years.  Lung Cancer Screening: (  Low Dose CT Chest recommended if Age 87-80 years, 30 pack-year currently smoking OR have quit w/in 15years.) does not qualify.   Lung Cancer Screening Referral: n/a  Additional Screening:  Hepatitis C Screening: does not qualify;   Vision Screening: Recommended annual ophthalmology exams for early detection of glaucoma and other disorders of the eye. Is the patient up to date with their annual eye exam?  Yes  Who is the provider or what is the name of the office in which the patient attends annual eye exams? Dr.Groat  If pt is not established with a provider, would they like to be referred to a provider to establish care? No .   Dental Screening: Recommended annual dental exams for proper oral hygiene  Community Resource Referral /  Chronic Care Management: CRR required this visit?  No   CCM required this visit?  No      Plan:     I have personally reviewed and noted the following in the patient's chart:   Medical and social history Use of alcohol, tobacco or illicit drugs  Current medications and supplements including opioid prescriptions. Patient is not currently taking opioid prescriptions. Functional ability and status Nutritional status Physical activity Advanced directives List of other physicians Hospitalizations, surgeries, and ER visits in previous 12 months Vitals Screenings to include cognitive, depression, and falls Referrals and appointments  In addition, I have reviewed and discussed with patient certain preventive protocols, quality metrics, and best practice recommendations. A written personalized care plan for preventive services as well as general preventive health recommendations were provided to patient.     Daphane Shepherd, LPN   9/38/1829   Nurse Notes: none        Subjective:   Frankee Gritz is a 76 y.o. female who presents for Medicare Annual (Subsequent) preventive examination.  Review of Systems     Cardiac Risk Factors include: advanced age (>46mn, >>83women);diabetes mellitus     Objective:    Today's Vitals   There is no height or weight on file to calculate BMI.     11/13/2021    9:14 AM 09/16/2020    1:35 PM 08/25/2017    8:18 AM 06/11/2015   10:16 AM  Advanced Directives  Does Patient Have a Medical Advance Directive? Yes No No No  Type of Advance Directive Living will     Would patient like information on creating a medical advance directive?  No - Patient declined No - Patient declined No - patient declined information    Current Medications (verified) Outpatient Encounter Medications as of 11/13/2021  Medication Sig   b complex vitamins tablet Take 1 tablet by mouth daily.   benzonatate (TESSALON PERLES) 100 MG capsule 1-2 capsule up to twice daily    Biotin 1000 MCG tablet Take 1,000 mcg by mouth daily.   Calcium Carbonate (CALCIUM 600 PO) Take by mouth daily.   Cetirizine HCl (ZYRTEC PO) Take by mouth as needed.   Cholecalciferol (VITAMIN D3) 1000 units CAPS    Coenzyme Q10 (COQ10) 100 MG CAPS    diclofenac Sodium (VOLTAREN) 1 % GEL Apply 2 g topically 4 (four) times daily.   fluticasone (FLONASE) 50 MCG/ACT nasal spray daily as needed.    Glucosamine 500 MG CAPS Take by mouth.   Lactobacillus-Inulin (PROBIOTIC DIGESTIVE SUPPORT PO) Take by mouth.   LUTEIN PO Take 20 mg by mouth daily.   metFORMIN (GLUCOPHAGE) 500 MG tablet Take 1 tablet (500 mg total) by mouth 2 (two) times  daily with a meal.   omeprazole (PRILOSEC) 20 MG capsule TAKE 1 CAPSULE(20 MG) BY MOUTH DAILY   simvastatin (ZOCOR) 40 MG tablet TAKE 1 TABLET(40 MG) BY MOUTH EVERY EVENING. FOLLOW UP IN DEC   vitamin C (ASCORBIC ACID) 500 MG tablet Take 500 mg by mouth daily.   vitamin E 400 UNIT capsule Take 400 Units by mouth daily.   No facility-administered encounter medications on file as of 11/13/2021.    Allergies (verified) Patient has no known allergies.   History: Past Medical History:  Diagnosis Date   Chronic sinusitis    Clostridium difficile infection 11/2015   Elevated cholesterol    Environmental allergies    Fibroid    GERD (gastroesophageal reflux disease)    Hypercholesteremia    Leg edema, left    Low back pain    Uterine prolapse 2010   Past Surgical History:  Procedure Laterality Date   TONSILLECTOMY AND ADENOIDECTOMY     Family History  Problem Relation Age of Onset   Cancer Mother 11       Stomach cancer   Hypertension Brother    Diabetes Brother        Type II   Osteoporosis Maternal Aunt    Hypertension Father    Stroke Father    Social History   Socioeconomic History   Marital status: Married    Spouse name: Not on file   Number of children: Not on file   Years of education: Not on file   Highest education level: Not on  file  Occupational History   Not on file  Tobacco Use   Smoking status: Never   Smokeless tobacco: Never  Vaping Use   Vaping Use: Never used  Substance and Sexual Activity   Alcohol use: No    Alcohol/week: 0.0 standard drinks of alcohol   Drug use: No   Sexual activity: Yes    Partners: Male    Birth control/protection: Post-menopausal  Other Topics Concern   Not on file  Social History Narrative   Not on file   Social Determinants of Health   Financial Resource Strain: Low Risk  (11/13/2021)   Overall Financial Resource Strain (CARDIA)    Difficulty of Paying Living Expenses: Not hard at all  Food Insecurity: No Food Insecurity (11/13/2021)   Hunger Vital Sign    Worried About Running Out of Food in the Last Year: Never true    [place] in the Last Year: Never true  Transportation Needs: No Transportation Needs (11/13/2021)   PRAPARE - Hydrologist (Medical): No    Lack of Transportation (Non-Medical): No  Physical Activity: Insufficiently Active (11/13/2021)   Exercise Vital Sign    Days of Exercise per Week: 2 days    Minutes of Exercise per Session: 60 min  Stress: No Stress Concern Present (11/13/2021)   [place]    Feeling of Stress : Not at all  Social Connections: [place] (11/13/2021)   Social Connection and Isolation Panel [NHANES]    Frequency of Communication with Friends and Family: Three times a week    Frequency of Social Gatherings with Friends and Family: Three times a week    Attends Religious Services: More than 4 times per year    Active Member of Clubs or Organizations: Yes    Attends Archivist Meetings: Not on file    Marital Status: Married  Tobacco Counseling Counseling given: Not Answered   Clinical Intake:  Pre-visit preparation completed: Yes  Pain : No/denies pain     Nutritional Risks: None Diabetes:  Yes CBG done?: No Did pt. bring in CBG monitor from home?: No  How often do you need to have someone help you when you read instructions, pamphlets, or other written materials from your doctor or pharmacy?: 1 - Never What is the last grade level you completed in school?: college  Diabetic?yes  Interpreter Needed?: No  Information entered by :: L.Wilson,LPN   Activities of Daily Living    11/13/2021    9:19 AM 06/18/2021    7:43 AM  In your present state of health, do you have any difficulty performing the following activities:  Hearing? 0 0  Vision? 0 0  Difficulty concentrating or making decisions? 0 0  Walking or climbing stairs? 0 0  Dressing or bathing? 0 0  Doing errands, shopping? 0 0  Preparing Food and eating ? N   Using the Toilet? N   In the past six months, have you accidently leaked urine? N   Do you have problems with loss of bowel control? N   Managing your Medications? N   Managing your Finances? N   Housekeeping or managing your Housekeeping? N     Patient Care Team: Midge Minium, MD as PCP - General (Family Medicine) Juanita Craver, MD as Consulting Physician (Gastroenterology) Salvadore Dom, MD as Consulting Physician (Obstetrics and Gynecology) [facility] as Consulting Physician  Indicate any recent Medical Services you may have received from other than [facility] providers in the past year (date may be approximate).     Assessment:   This is a routine wellness examination for Roslyn.  Hearing/Vision screen Vision Screening - Comments:: Annual eye exams   Dietary issues and exercise activities discussed: Current Exercise Habits: Home exercise routine, Type of exercise: walking;strength training/weights, Time (Minutes): 60, Frequency (Times/Week): 4, Weekly Exercise (Minutes/Week): 240, Intensity: Mild, Exercise limited by: None identified   Goals Addressed             This Visit's Progress    Increase physical  activity   On track    Increase activity by walking more.        Depression Screen    11/13/2021    9:15 AM 11/13/2021    9:13 AM 10/23/2021    8:26 AM 08/29/2021   12:35 PM 06/18/2021    7:47 AM 03/20/2021   10:34 AM 11/13/2020    9:57 AM  PHQ 2/9 Scores  PHQ - 2 Score 0 0 0 0 0 0 0  PHQ- 9 Score   0 0 0 0 0    Fall Risk    11/13/2021    9:15 AM 10/23/2021    8:26 AM 08/29/2021   12:36 PM 06/18/2021    7:47 AM 03/20/2021   10:34 AM  Fall Risk   Falls in the past year? 0 0 0 0 0  Number falls in past yr: 0 0 0    Injury with Fall? 0 0 0    Risk for fall due to :  No Fall Risks No Fall Risks No Fall Risks   Follow up Falls evaluation completed;Education provided Falls evaluation completed Falls evaluation completed Falls evaluation completed     [place]:  Any stairs in or around the home? Yes  If so, are there any without handrails? No  Home free of loose throw rugs in walkways, pet beds, electrical cords, etc? Yes  Adequate lighting in your home to reduce risk of falls? Yes   ASSISTIVE DEVICES UTILIZED TO PREVENT FALLS:  Life alert? No  Use of a cane, walker or w/c? No  Grab bars in the bathroom? Yes  Shower chair or bench in shower? No  Elevated toilet seat or a handicapped toilet? No   Cognitive Function:    08/25/2017    8:19 AM  MMSE - Mini Mental State Exam  Orientation to time 5  Orientation to Place 5  Registration 3  Attention/ Calculation 5  Recall 3  Language- name 2 objects 2  Language- repeat 1  Language- follow 3 step command 3  Language- read & follow direction 1  Write a sentence 1  Copy design 1  Total score 30        11/13/2021    9:18 AM  6CIT Screen  What Year? 0 points  What month? 0 points  What time? 0 points  Count back from 20 0 points  Months in reverse 0 points  Repeat phrase 0 points  Total Score 0 points    Immunizations Immunization History  Administered Date(s) Administered   Fluad  Quad(high Dose 65+) 12/28/2018, 03/20/2021   Influenza Split 01/23/2020   Influenza,inj,Quad PF,6+ Mos 02/26/2015, 12/05/2015, 02/16/2017, 11/25/2017   MODERNA COVID-19 SARS-COV-2 PEDS BIVALENT BOOSTER 6Y-11Y 08/29/2020   Moderna SARS-COV2 Booster Vaccination 08/29/2020   Moderna Sars-Covid-2 Vaccination 05/07/2019, 06/04/2019, 04/05/2020   Pneumococcal Conjugate-13 08/21/2016   Pneumococcal Polysaccharide-23 10/29/2010   Tdap 12/23/2009   Zoster Recombinat (Shingrix) 08/21/2017, 11/25/2017   Zoster, Live 12/23/2009    TDAP status: Due, Education has been provided regarding the importance of this vaccine. Advised may receive this vaccine at local pharmacy or Health Dept. Aware to provide a copy of the vaccination record if obtained from local pharmacy or Health Dept. Verbalized acceptance and understanding.  Flu Vaccine status: Up to date  Pneumococcal vaccine status: Up to date  Covid-19 vaccine status: Information provided on how to obtain vaccines.   Qualifies for Shingles Vaccine? Yes   Zostavax completed Yes   Shingrix Completed?: Yes  Screening Tests Health Maintenance  Topic Date Due   COVID-19 Vaccine (5 - Moderna series) 12/29/2020   INFLUENZA VACCINE  10/21/2021   TETANUS/TDAP  11/13/2021 (Originally 12/24/2019)   URINE MICROALBUMIN  11/23/2021 (Originally 09/04/2021)   MAMMOGRAM  01/06/2022   OPHTHALMOLOGY EXAM  02/26/2022   HEMOGLOBIN A1C  04/25/2022   FOOT EXAM  10/24/2022   COLONOSCOPY (Pts 45-77yr Insurance coverage will need to be confirmed)  03/01/2023   Pneumonia Vaccine 76 Years old  Completed   DEXA SCAN  Completed   Hepatitis C Screening  Completed   Zoster Vaccines- Shingrix  Completed   HPV VACCINES  Aged Out    Health Maintenance  Health Maintenance Due  Topic Date Due   COVID-19 Vaccine (5 - Moderna series) 12/29/2020   INFLUENZA VACCINE  10/21/2021    Colorectal cancer screening: Type of screening: Colonoscopy. Completed 12//11/2017. Repeat  every 5 years  Mammogram status: Completed 01/06/2021. Repeat every year  Bone Density status: Completed 11/14/2019. Results reflect: Bone density results: OSTEOPENIA. Repeat every 5 years.  Lung Cancer Screening: (Low Dose CT Chest recommended if Age 76-80years, 30 pack-year currently smoking OR have quit w/in 15years.) does not qualify.   Lung Cancer Screening Referral: n/a  Additional Screening:  Hepatitis C Screening: does not qualify;  Vision Screening: Recommended annual ophthalmology exams for early detection of glaucoma and other disorders of the eye. Is the patient up to date with their annual eye exam?  Yes  Who is the provider or what is the name of the office in which the patient attends annual eye exams? Dr.Groat  If pt is not established with a provider, would they like to be referred to a provider to establish care? No .   Dental Screening: Recommended annual dental exams for proper oral hygiene  Community Resource Referral / Chronic Care Management: CRR required this visit?  No   CCM required this visit?  No      Plan:     I have personally reviewed and noted the following in the patient's chart:   Medical and social history Use of alcohol, tobacco or illicit drugs  Current medications and supplements including opioid prescriptions. Patient is not currently taking opioid prescriptions. Functional ability and status Nutritional status Physical activity Advanced directives List of other physicians Hospitalizations, surgeries, and ER visits in previous 12 months Vitals Screenings to include cognitive, depression, and falls Referrals and appointments  In addition, I have reviewed and discussed with patient certain preventive protocols, quality metrics, and best practice recommendations. A written personalized care plan for preventive services as well as general preventive health recommendations were provided to patient.     Daphane Shepherd,  LPN   [id number]   Nurse Notes: none

## 2021-11-13 NOTE — Patient Instructions (Signed)
Ms. Heather Gallegos , Thank you for taking time to come for your Medicare Wellness Visit. I appreciate your ongoing commitment to your health goals. Please review the following plan we discussed and let me know if I can assist you in the future.   Screening recommendations/referrals: Colonoscopy: 02/28/2018  due 2024 Mammogram: 01/06/2021 Bone Density: 11/14/2019 Recommended yearly ophthalmology/optometry visit for glaucoma screening and checkup Recommended yearly dental visit for hygiene and checkup  Vaccinations: Influenza vaccine: completed  Pneumococcal vaccine: completed  Tdap vaccine: due  Shingles vaccine: completed     Advanced directives: yes   Conditions/risks identified: none   Next appointment: none    Preventive Care 76 Years and Older, Female Preventive care refers to lifestyle choices and visits with your health care provider that can promote health and wellness. What does preventive care include? A yearly physical exam. This is also called an annual well check. Dental exams once or twice a year. Routine eye exams. Ask your health care provider how often you should have your eyes checked. Personal lifestyle choices, including: Daily care of your teeth and gums. Regular physical activity. Eating a healthy diet. Avoiding tobacco and drug use. Limiting alcohol use. Practicing safe sex. Taking low-dose aspirin every day. Taking vitamin and mineral supplements as recommended by your health care provider. What happens during an annual well check? The services and screenings done by your health care provider during your annual well check will depend on your age, overall health, lifestyle risk factors, and family history of disease. Counseling  Your health care provider may ask you questions about your: Alcohol use. Tobacco use. Drug use. Emotional well-being. Home and relationship well-being. Sexual activity. Eating habits. History of falls. Memory and ability to  understand (cognition). Work and work Statistician. Reproductive health. Screening  You may have the following tests or measurements: Height, weight, and BMI. Blood pressure. Lipid and cholesterol levels. These may be checked every 5 years, or more frequently if you are over 27 years old. Skin check. Lung cancer screening. You may have this screening every year starting at age 76 if you have a 30-pack-year history of smoking and currently smoke or have quit within the past 15 years. Fecal occult blood test (FOBT) of the stool. You may have this test every year starting at age 23. Flexible sigmoidoscopy or colonoscopy. You may have a sigmoidoscopy every 5 years or a colonoscopy every 10 years starting at age 64. Hepatitis C blood test. Hepatitis B blood test. Sexually transmitted disease (STD) testing. Diabetes screening. This is done by checking your blood sugar (glucose) after you have not eaten for a while (fasting). You may have this done every 1-3 years. Bone density scan. This is done to screen for osteoporosis. You may have this done starting at age 51. Mammogram. This may be done every 1-2 years. Talk to your health care provider about how often you should have regular mammograms. Talk with your health care provider about your test results, treatment options, and if necessary, the need for more tests. Vaccines  Your health care provider may recommend certain vaccines, such as: Influenza vaccine. This is recommended every year. Tetanus, diphtheria, and acellular pertussis (Tdap, Td) vaccine. You may need a Td booster every 10 years. Zoster vaccine. You may need this after age 89. Pneumococcal 13-valent conjugate (PCV13) vaccine. One dose is recommended after age 53. Pneumococcal polysaccharide (PPSV23) vaccine. One dose is recommended after age 46. Talk to your health care provider about which screenings and vaccines you need  and how often you need them. This information is not  intended to replace advice given to you by your health care provider. Make sure you discuss any questions you have with your health care provider. Document Released: 04/05/2015 Document Revised: 11/27/2015 Document Reviewed: 01/08/2015 Elsevier Interactive Patient Education  2017 [employer] Prevention in the Home Falls can cause injuries. They can happen to people of all ages. There are many things you can do to make your home safe and to help prevent falls. What can I do on the outside of my home? Regularly fix the edges of walkways and driveways and fix any cracks. Remove anything that might make you trip as you walk through a door, such as a raised step or threshold. Trim any bushes or trees on the path to your home. Use bright outdoor lighting. Clear any walking paths of anything that might make someone trip, such as rocks or tools. Regularly check to see if handrails are loose or broken. Make sure that both sides of any steps have handrails. Any raised decks and porches should have guardrails on the edges. Have any leaves, snow, or ice cleared regularly. Use sand or salt on walking paths during winter. Clean up any spills in your garage right away. This includes oil or grease spills. What can I do in the bathroom? Use night lights. Install grab bars by the toilet and in the tub and shower. Do not use towel bars as grab bars. Use non-skid mats or decals in the tub or shower. If you need to sit down in the shower, use a plastic, non-slip stool. Keep the floor dry. Clean up any water that spills on the floor as soon as it happens. Remove soap buildup in the tub or shower regularly. Attach bath mats securely with double-sided non-slip rug tape. Do not have throw rugs and other things on the floor that can make you trip. What can I do in the bedroom? Use night lights. Make sure that you have a light by your bed that is easy to reach. Do not use any sheets or blankets that are  too big for your bed. They should not hang down onto the floor. Have a firm chair that has side arms. You can use this for support while you get dressed. Do not have throw rugs and other things on the floor that can make you trip. What can I do in the kitchen? Clean up any spills right away. Avoid walking on wet floors. Keep items that you use a lot in easy-to-reach places. If you need to reach something above you, use a strong step stool that has a grab bar. Keep electrical cords out of the way. Do not use floor polish or wax that makes floors slippery. If you must use wax, use non-skid floor wax. Do not have throw rugs and other things on the floor that can make you trip. What can I do with my stairs? Do not leave any items on the stairs. Make sure that there are handrails on both sides of the stairs and use them. Fix handrails that are broken or loose. Make sure that handrails are as long as the stairways. Check any carpeting to make sure that it is firmly attached to the stairs. Fix any carpet that is loose or worn. Avoid having throw rugs at the top or bottom of the stairs. If you do have throw rugs, attach them to the floor with carpet tape. Make sure that you  have a light switch at the top of the stairs and the bottom of the stairs. If you do not have them, ask someone to add them for you. What else can I do to help prevent falls? Wear shoes that: Do not have high heels. Have rubber bottoms. Are comfortable and fit you well. Are closed at the toe. Do not wear sandals. If you use a stepladder: Make sure that it is fully opened. Do not climb a closed stepladder. Make sure that both sides of the stepladder are locked into place. Ask someone to hold it for you, if possible. Clearly mark and make sure that you can see: Any grab bars or handrails. First and last steps. Where the edge of each step is. Use tools that help you move around (mobility aids) if they are needed. These  include: Canes. Walkers. Scooters. Crutches. Turn on the lights when you go into a dark area. Replace any light bulbs as soon as they burn out. Set up your furniture so you have a clear path. Avoid moving your furniture around. If any of your floors are uneven, fix them. If there are any pets around you, be aware of where they are. Review your medicines with your doctor. Some medicines can make you feel dizzy. This can increase your chance of falling. Ask your doctor what other things that you can do to help prevent falls. This information is not intended to replace advice given to you by your health care provider. Make sure you discuss any questions you have with your health care provider. Document Released: 01/03/2009 Document Revised: 08/15/2015 Document Reviewed: 04/13/2014 Elsevier Interactive Patient Education  2017 [employer].

## 2021-12-23 ENCOUNTER — Other Ambulatory Visit: Payer: Self-pay

## 2021-12-23 DIAGNOSIS — E785 Hyperlipidemia, unspecified: Secondary | ICD-10-CM

## 2021-12-23 MED ORDER — SIMVASTATIN 40 MG PO TABS: ORAL_TABLET | ORAL | 1 refills | Status: DC

## 2022-01-29 ENCOUNTER — Ambulatory Visit (INDEPENDENT_AMBULATORY_CARE_PROVIDER_SITE_OTHER): Payer: Medicare PPO | Admitting: Family Medicine

## 2022-01-29 ENCOUNTER — Encounter: Payer: Self-pay | Admitting: Family Medicine

## 2022-01-29 ENCOUNTER — Telehealth: Payer: Self-pay

## 2022-01-29 VITALS — BP 116/68 | HR 81 | Temp 97.0°F | Resp 18 | Ht 59.0 in | Wt 148.1 lb

## 2022-01-29 DIAGNOSIS — E119 Type 2 diabetes mellitus without complications: Secondary | ICD-10-CM | POA: Diagnosis not present

## 2022-01-29 DIAGNOSIS — E663 Overweight: Secondary | ICD-10-CM

## 2022-01-29 DIAGNOSIS — R21 Rash and other nonspecific skin eruption: Secondary | ICD-10-CM

## 2022-01-29 DIAGNOSIS — E785 Hyperlipidemia, unspecified: Secondary | ICD-10-CM

## 2022-01-29 LAB — CBC WITH DIFFERENTIAL/PLATELET
Basophils Absolute: 0 10*3/uL (ref 0.0–0.1)
Basophils Relative: 0.7 % (ref 0.0–3.0)
Eosinophils Absolute: 0.2 10*3/uL (ref 0.0–0.7)
Eosinophils Relative: 2.9 % (ref 0.0–5.0)
HCT: 38.8 % (ref 36.0–46.0)
Hemoglobin: 13.2 g/dL (ref 12.0–15.0)
Lymphocytes Relative: 30.8 % (ref 12.0–46.0)
Lymphs Abs: 1.7 10*3/uL (ref 0.7–4.0)
MCHC: 34.2 g/dL (ref 30.0–36.0)
MCV: 88.8 fl (ref 78.0–100.0)
Monocytes Absolute: 0.5 10*3/uL (ref 0.1–1.0)
Monocytes Relative: 8.4 % (ref 3.0–12.0)
Neutro Abs: 3.2 10*3/uL (ref 1.4–7.7)
Neutrophils Relative %: 57.2 % (ref 43.0–77.0)
Platelets: 308 10*3/uL (ref 150.0–400.0)
RBC: 4.37 Mil/uL (ref 3.87–5.11)
RDW: 13.2 % (ref 11.5–15.5)
WBC: 5.6 10*3/uL (ref 4.0–10.5)

## 2022-01-29 LAB — BASIC METABOLIC PANEL
BUN: 23 mg/dL (ref 6–23)
CO2: 28 mEq/L (ref 19–32)
Calcium: 9.3 mg/dL (ref 8.4–10.5)
Chloride: 104 mEq/L (ref 96–112)
Creatinine, Ser: 0.93 mg/dL (ref 0.40–1.20)
GFR: 59.7 mL/min — ABNORMAL LOW (ref >60.00)
Glucose, Bld: 120 mg/dL — ABNORMAL HIGH (ref 70–99)
Potassium: 4.5 mEq/L (ref 3.5–5.1)
Sodium: 138 mEq/L (ref 135–145)

## 2022-01-29 LAB — LIPID PANEL
Cholesterol: 124 mg/dL (ref 0–200)
HDL: 44.6 mg/dL (ref >39.00)
LDL Cholesterol: 51 mg/dL (ref 0–99)
NonHDL: 79.1
Total CHOL/HDL Ratio: 3
Triglycerides: 141 mg/dL (ref 0.0–149.0)
VLDL: 28.2 mg/dL (ref 0.0–40.0)

## 2022-01-29 LAB — MICROALBUMIN / CREATININE URINE RATIO
Creatinine,U: 94.5 mg/dL
Microalb Creat Ratio: 0.7 mg/g (ref 0.0–30.0)
Microalb, Ur: <0.7 mg/dL (ref 0.0–1.9)

## 2022-01-29 LAB — HEPATIC FUNCTION PANEL
ALT: 20 U/L (ref 0–35)
AST: 21 U/L (ref 0–37)
Albumin: 4.2 g/dL (ref 3.5–5.2)
Alkaline Phosphatase: 53 U/L (ref 39–117)
Bilirubin, Direct: 0.1 mg/dL (ref 0.0–0.3)
Total Bilirubin: 0.4 mg/dL (ref 0.2–1.2)
Total Protein: 6.8 g/dL (ref 6.0–8.3)

## 2022-01-29 LAB — TSH: TSH: 3.77 u[IU]/mL (ref 0.35–5.50)

## 2022-01-29 LAB — HEMOGLOBIN A1C: Hgb A1c MFr Bld: 6.9 % — ABNORMAL HIGH (ref 4.6–6.5)

## 2022-01-29 MED ORDER — TRIAMCINOLONE ACETONIDE 0.1 % EX OINT: 1.0000 | TOPICAL_OINTMENT | Freq: Two times a day (BID) | CUTANEOUS | 1 refills | Status: AC

## 2022-01-29 NOTE — Telephone Encounter (Signed)
Left VM stating  lab results

## 2022-01-29 NOTE — Assessment & Plan Note (Signed)
Chronic problem.  Initially had some difficulty w/ Metformin (GI issues) but now doing much better.  UTD on eye exam, foot exam.  Microalbumin ordered.  Check labs.  Adjust meds prn

## 2022-01-29 NOTE — Patient Instructions (Signed)
Follow up in 3-4 months to recheck sugars We'll notify you of your lab results and make any changes if needed Keep up the good work on healthy diet and regular exercise- you're doing great!!! USE the Triamcinolone ointment twice daily on the rash Call with any questions or concerns Stay Safe!  Stay Healthy! Happy Holidays!!!

## 2022-01-29 NOTE — Progress Notes (Signed)
   Subjective:    Patient ID: Heather Gallegos, female    DOB: 07-Feb-1946, 76 y.o.   MRN: [id number]  HPI DM- chronic problem.  Currently on Metformin '500mg'$  BID.  Due for microalbumin.  UTD on eye exam, foot exam.  Pt initially had GI issues when starting Metformin but this has resolved.  No CP, SOB, Has, visual changes.  Denies symptomatic lows.  No numbness/tingling of hands/feet.  Hyperlipidemia- chronic problem, on Simvastatin '40mg'$  daily.  No abd pain, N/V.  L wrist- pt reports rash x1 month.  Itchy but does not burn.  Has applied Cortisone and Bacitracin w/o improvement.  Overweight- pt is down 4 lbs since last visit.  Pt reports feeling good.  Husband is currently on [facility] which is helping her carb intake and eating habits.   Review of Systems For ROS see HPI     Objective:   Physical Exam Vitals reviewed.  Constitutional:      General: She is not in acute distress.    Appearance: Normal appearance. She is well-developed. She is not ill-appearing.  HENT:     Head: Normocephalic and atraumatic.  Eyes:     Conjunctiva/sclera: Conjunctivae normal.     Pupils: Pupils are equal, round, and reactive to light.  Neck:     Thyroid: No thyromegaly.  Cardiovascular:     Rate and Rhythm: Normal rate and regular rhythm.     Pulses: Normal pulses.     Heart sounds: Normal heart sounds. No murmur heard. Pulmonary:     Effort: Pulmonary effort is normal. No respiratory distress.     Breath sounds: Normal breath sounds.  Abdominal:     General: There is no distension.     Palpations: Abdomen is soft.     Tenderness: There is no abdominal tenderness.  Musculoskeletal:     Cervical back: Normal range of motion and neck supple.     Right lower leg: No edema.     Left lower leg: No edema.  Lymphadenopathy:     Cervical: No cervical adenopathy.  Skin:    General: Skin is warm and dry.     Findings: Rash (dorsum of L wrist, erythematous rash) present.  Neurological:     General: No focal  deficit present.     Mental Status: She is alert and oriented to person, place, and time.  Psychiatric:        Behavior: Behavior normal.           Assessment & Plan:  Rash- new.  Dorsum of L wrist.  Appears consistent w/ eczema or other dermatitis.  Will start Triamcinolone ointment BID.  Pt to notify if no improvement.  Pt expressed understanding and is in agreement w/ plan.

## 2022-01-29 NOTE — Telephone Encounter (Signed)
-----   Message from Midge Minium, MD sent at 01/29/2022  3:23 PM EST ----- Labs look great!  No changes at this time

## 2022-01-29 NOTE — Assessment & Plan Note (Signed)
Chronic problem.  Currently on Simvastatin '40mg'$  daily w/o difficulty.  Check labs.  Adjust meds prn

## 2022-01-29 NOTE — Assessment & Plan Note (Signed)
Pt has lost 4 lbs since last visit.  Applauded her efforts at healthy diet.  Will continue to follow.

## 2022-02-09 ENCOUNTER — Ambulatory Visit (INDEPENDENT_AMBULATORY_CARE_PROVIDER_SITE_OTHER): Payer: Medicare PPO | Admitting: Family Medicine

## 2022-02-09 ENCOUNTER — Encounter: Payer: Self-pay | Admitting: Family Medicine

## 2022-02-09 VITALS — BP 128/70 | HR 97 | Temp 98.0°F | Ht 59.0 in | Wt 147.6 lb

## 2022-02-09 DIAGNOSIS — N3091 Cystitis, unspecified with hematuria: Secondary | ICD-10-CM | POA: Diagnosis not present

## 2022-02-09 DIAGNOSIS — R350 Frequency of micturition: Secondary | ICD-10-CM | POA: Diagnosis not present

## 2022-02-09 LAB — POCT URINALYSIS DIP (MANUAL ENTRY)
Bilirubin, UA: NEGATIVE
Glucose, UA: NEGATIVE mg/dL
Ketones, POC UA: NEGATIVE mg/dL
Nitrite, UA: POSITIVE — AB
Protein Ur, POC: NEGATIVE mg/dL
Spec Grav, UA: 1.015 (ref 1.010–1.025)
Urobilinogen, UA: 0.2 E.U./dL
pH, UA: 6 (ref 5.0–8.0)

## 2022-02-09 MED ORDER — CEPHALEXIN 500 MG PO CAPS: 500.0000 mg | ORAL_CAPSULE | Freq: Two times a day (BID) | ORAL | 0 refills | Status: DC

## 2022-02-09 NOTE — Progress Notes (Signed)
Subjective:  Patient ID: Heather Gallegos, female    DOB: 15-Jul-1945  Age: 76 y.o. MRN: [id number]  CC:  Chief Complaint  Patient presents with   Urinary Frequency    Pt states it started on Friday, pt states she is having burning, pain and frequency when urinating     HPI Heather Gallegos presents for   Urinary frequency Dysuria, frequent urination started 3 days ago. Burning feeling, sharp pain, feeling of incomplete emptying.  No fevers, nausea, vomiting, abdominal pain or back pain. Attempted treatments: cranberry juice, fluids.   Last uti about a year ago.    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   Gastroesophageal reflux disease 05/17/2020   Internal hemorrhoids 05/17/2020   Personal history of colonic polyps 05/17/2020   Diverticulitis 05/17/2020   Overweight (BMI 25.0-29.9) 09/13/2019   Osteopenia 09/13/2019   Diabetes mellitus type II, controlled, with no complications ([place]) 40/98/1191   Indigestion 02/25/2018   Physical exam 08/21/2016   Change of skin color 09/05/2015   Hyperlipidemia    Uterine prolapse    Past Medical History:  Diagnosis Date   Chronic sinusitis    Clostridium difficile infection 11/2015   Elevated cholesterol    Environmental allergies    Fibroid    GERD (gastroesophageal reflux disease)    Hypercholesteremia    Leg edema, left    Low back pain    Uterine prolapse 2010   Past Surgical History:  Procedure Laterality Date   TONSILLECTOMY AND ADENOIDECTOMY     No Known Allergies Prior to Admission medications   Medication Sig Start Date End Date Taking? Authorizing Provider  b complex vitamins tablet Take 1 tablet by mouth daily.   Yes [provider]  Biotin 1000 MCG tablet Take 1,000 mcg by mouth daily.   Yes [provider]  Calcium Carbonate (CALCIUM 600 PO) Take by mouth daily.   Yes [provider]   Cetirizine HCl (ZYRTEC PO) Take by mouth as needed.   Yes [provider]  Cholecalciferol (VITAMIN D3) 1000 units CAPS  07/26/17  Yes [provider]  Coenzyme Q10 (COQ10) 100 MG CAPS  06/21/17  Yes [provider]  fluticasone (FLONASE) 50 MCG/ACT nasal spray daily as needed.  12/23/15  Yes [provider]  Glucosamine 500 MG CAPS Take by mouth.   Yes [provider]  Lactobacillus-Inulin (PROBIOTIC DIGESTIVE SUPPORT PO) Take by mouth.   Yes [provider]  LUTEIN PO Take 20 mg by mouth daily.   Yes [provider]  metFORMIN (GLUCOPHAGE) 500 MG tablet Take 1 tablet (500 mg total) by mouth 2 (two) times daily with a meal. 10/23/21  Yes Midge Minium, MD  simvastatin (ZOCOR) 40 MG tablet Take one tablet daily 12/23/21  Yes Midge Minium, MD  triamcinolone ointment (KENALOG) 0.1 % Apply 1 Application topically 2 (two) times daily. 01/29/22 01/29/23 Yes Midge Minium, MD  vitamin C (ASCORBIC ACID) 500 MG tablet Take 500 mg by mouth daily.   Yes [provider]  vitamin E 400 UNIT capsule Take 400 Units by mouth daily.   Yes [provider]   Social History   Socioeconomic History   Marital status: Married    Spouse name: Not on file   Number of children: Not on file   Years of education: Not on file   Highest education level: Not on file  Occupational History   Not on file  Tobacco Use   Smoking status: Never   Smokeless tobacco: Never  Vaping Use   Vaping Use: Never used  Substance and Sexual Activity   Alcohol use: No    Alcohol/week: 0.0 standard drinks of alcohol   Drug use: No   Sexual activity: Yes    Partners: Male    Birth control/protection: Post-menopausal  Other Topics Concern   Not on file  Social History Narrative   Not on file   Social Determinants of Health   Financial Resource Strain: Low Risk  (11/13/2021)   Overall Financial Resource Strain (CARDIA)    Difficulty of  Paying Living Expenses: Not hard at all  Food Insecurity: No Food Insecurity (11/13/2021)   Hunger Vital Sign    Worried About Running Out of Food in the Last Year: Never true    Ran Out of Food in the Last Year: Never true  Transportation Needs: No Transportation Needs (11/13/2021)   PRAPARE - Hydrologist (Medical): No    Lack of Transportation (Non-Medical): No  Physical Activity: Insufficiently Active (11/13/2021)   Exercise Vital Sign    Days of Exercise per Week: 2 days    Minutes of Exercise per Session: 60 min  Stress: No Stress Concern Present (11/13/2021)   [place]    Feeling of Stress : Not at all  Social Connections: [place] (11/13/2021)   Social Connection and Isolation Panel [NHANES]    Frequency of Communication with Friends and Family: Three times a week    Frequency of Social Gatherings with Friends and Family: Three times a week    Attends Religious Services: More than 4 times per year    Active Member of Clubs or Organizations: Yes    Attends Archivist Meetings: Not on file    Marital Status: Married  Intimate Partner Violence: Not At Risk (11/13/2021)   Humiliation, Afraid, Rape, and Kick questionnaire    Fear of Current or Ex-Partner: No    Emotionally Abused: No    Physically Abused: No    Sexually Abused: No    Review of Systems Per HPI.   Objective:   Vitals:   02/09/22 1153  BP: 128/70  Pulse: 97  Temp: 98 F (36.7 C)  SpO2: 98%  Weight: 147 lb 9.6 oz (67 kg)  Height: '4\' 11"'$  (1.499 m)     Physical Exam Constitutional:      Appearance: Normal appearance. She is well-developed.  HENT:     Head: Normocephalic and atraumatic.  Pulmonary:     Effort: Pulmonary effort is normal.  Abdominal:     General: There is no distension.     Palpations: Abdomen is soft.     Tenderness: There is no abdominal tenderness. There is no  guarding or rebound.  Skin:    General: Skin is warm.  Neurological:     Mental Status: She is alert and oriented to person, place, and time.  Psychiatric:        Behavior: Behavior normal.      Results for orders placed or performed in visit on 02/09/22  POCT urinalysis dipstick  Result Value Ref Range   Color, UA yellow yellow   Clarity, UA cloudy (A) clear   Glucose, UA negative negative mg/dL   Bilirubin, UA negative negative   Ketones, POC UA negative negative mg/dL   Spec Grav, UA 1.015 1.010 -  1.025   Blood, UA trace-lysed (A) negative   pH, UA 6.0 5.0 - 8.0   Protein Ur, POC negative negative mg/dL   Urobilinogen, UA 0.2 0.2 or 1.0 E.U./dL   Nitrite, UA Positive (A) Negative   Leukocytes, UA Trace (A) Negative     Assessment & Plan:  Heather Gallegos is a 76 y.o. female . Hemorrhagic cystitis - Plan: Urine Culture, cephALEXin (KEFLEX) 500 MG capsule  Frequency of urination - Plan: POCT urinalysis dipstick, Urine Culture, cephALEXin (KEFLEX) 500 MG capsule No concerning findings on exam, UTI with trace hematuria noted on in office urinalysis.  Check urine culture.  Start Keflex, 7-day course, continue symptomatic care, handout given.  RTC precautions.  Meds ordered this encounter  Medications   cephALEXin (KEFLEX) 500 MG capsule    Sig: Take 1 capsule (500 mg total) by mouth 2 (two) times daily.    Dispense:  14 capsule    Refill:  0   Patient Instructions  Urine test does indicate infection. I will check a urine culture, and let you know if any changes needed. Start cephalexin for now.  Thanks for coming in today and hope you feel better soon.   Urinary Tract Infection, Adult  A urinary tract infection (UTI) is an infection of any part of the urinary tract. The urinary tract includes the kidneys, ureters, bladder, and urethra. These organs make, store, and get rid of urine in the body. An upper UTI affects the ureters and kidneys. A lower UTI affects the bladder  and urethra. What are the causes? Most urinary tract infections are caused by bacteria in your genital area around your urethra, where urine leaves your body. These bacteria grow and cause inflammation of your urinary tract. What increases the risk? You are more likely to develop this condition if: You have a urinary catheter that stays in place. You are not able to control when you urinate or have a bowel movement (incontinence). You are female and you: Use a spermicide or diaphragm for birth control. Have low estrogen levels. Are pregnant. You have certain genes that increase your risk. You are sexually active. You take antibiotic medicines. You have a condition that causes your flow of urine to slow down, such as: An enlarged prostate, if you are female. Blockage in your urethra. A kidney stone. A nerve condition that affects your bladder control (neurogenic bladder). Not getting enough to drink, or not urinating often. You have certain medical conditions, such as: Diabetes. A weak disease-fighting system (immunesystem). Sickle cell disease. Gout. Spinal cord injury. What are the signs or symptoms? Symptoms of this condition include: Needing to urinate right away (urgency). Frequent urination. This may include small amounts of urine each time you urinate. Pain or burning with urination. Blood in the urine. Urine that smells bad or unusual. Trouble urinating. Cloudy urine. Vaginal discharge, if you are female. Pain in the abdomen or the lower back. You may also have: Vomiting or a decreased appetite. Confusion. Irritability or tiredness. A fever or chills. Diarrhea. The first symptom in older adults may be confusion. In some cases, they may not have any symptoms until the infection has worsened. How is this diagnosed? This condition is diagnosed based on your medical history and a physical exam. You may also have other tests, including: Urine tests. Blood tests. Tests  for STIs (sexually transmitted infections). If you have had more than one UTI, a cystoscopy or imaging studies may be done to determine the cause of the  infections. How is this treated? Treatment for this condition includes: Antibiotic medicine. Over-the-counter medicines to treat discomfort. Drinking enough water to stay hydrated. If you have frequent infections or have other conditions such as a kidney stone, you may need to see a health care provider who specializes in the urinary tract (urologist). In rare cases, urinary tract infections can cause sepsis. Sepsis is a life-threatening condition that occurs when the body responds to an infection. Sepsis is treated in the hospital with IV antibiotics, fluids, and other medicines. Follow these instructions at home:  Medicines Take over-the-counter and prescription medicines only as told by your health care provider. If you were prescribed an antibiotic medicine, take it as told by your health care provider. Do not stop using the antibiotic even if you start to feel better. General instructions Make sure you: Empty your bladder often and completely. Do not hold urine for long periods of time. Empty your bladder after sex. Wipe from front to back after urinating or having a bowel movement if you are female. Use each tissue only one time when you wipe. Drink enough fluid to keep your urine pale yellow. Keep all follow-up visits. This is important. Contact a health care provider if: Your symptoms do not get better after 1-2 days. Your symptoms go away and then return. Get help right away if: You have severe pain in your back or your lower abdomen. You have a fever or chills. You have nausea or vomiting. Summary A urinary tract infection (UTI) is an infection of any part of the urinary tract, which includes the kidneys, ureters, bladder, and urethra. Most urinary tract infections are caused by bacteria in your genital area. Treatment for  this condition often includes antibiotic medicines. If you were prescribed an antibiotic medicine, take it as told by your health care provider. Do not stop using the antibiotic even if you start to feel better. Keep all follow-up visits. This is important. This information is not intended to replace advice given to you by your health care provider. Make sure you discuss any questions you have with your health care provider. Document Revised: 10/20/2019 Document Reviewed: 10/20/2019 Elsevier Patient Education  [place],   Merri Ray, MD [facility] 02/09/22 12:50 PM

## 2022-02-09 NOTE — Patient Instructions (Signed)
Urine test does indicate infection. I will check a urine culture, and let you know if any changes needed. Start cephalexin for now.  Thanks for coming in today and hope you feel better soon.   Urinary Tract Infection, Adult  A urinary tract infection (UTI) is an infection of any part of the urinary tract. The urinary tract includes the kidneys, ureters, bladder, and urethra. These organs make, store, and get rid of urine in the body. An upper UTI affects the ureters and kidneys. A lower UTI affects the bladder and urethra. What are the causes? Most urinary tract infections are caused by bacteria in your genital area around your urethra, where urine leaves your body. These bacteria grow and cause inflammation of your urinary tract. What increases the risk? You are more likely to develop this condition if: You have a urinary catheter that stays in place. You are not able to control when you urinate or have a bowel movement (incontinence). You are female and you: Use a spermicide or diaphragm for birth control. Have low estrogen levels. Are pregnant. You have certain genes that increase your risk. You are sexually active. You take antibiotic medicines. You have a condition that causes your flow of urine to slow down, such as: An enlarged prostate, if you are female. Blockage in your urethra. A kidney stone. A nerve condition that affects your bladder control (neurogenic bladder). Not getting enough to drink, or not urinating often. You have certain medical conditions, such as: Diabetes. A weak disease-fighting system (immunesystem). Sickle cell disease. Gout. Spinal cord injury. What are the signs or symptoms? Symptoms of this condition include: Needing to urinate right away (urgency). Frequent urination. This may include small amounts of urine each time you urinate. Pain or burning with urination. Blood in the urine. Urine that smells bad or unusual. Trouble urinating. Cloudy  urine. Vaginal discharge, if you are female. Pain in the abdomen or the lower back. You may also have: Vomiting or a decreased appetite. Confusion. Irritability or tiredness. A fever or chills. Diarrhea. The first symptom in older adults may be confusion. In some cases, they may not have any symptoms until the infection has worsened. How is this diagnosed? This condition is diagnosed based on your medical history and a physical exam. You may also have other tests, including: Urine tests. Blood tests. Tests for STIs (sexually transmitted infections). If you have had more than one UTI, a cystoscopy or imaging studies may be done to determine the cause of the infections. How is this treated? Treatment for this condition includes: Antibiotic medicine. Over-the-counter medicines to treat discomfort. Drinking enough water to stay hydrated. If you have frequent infections or have other conditions such as a kidney stone, you may need to see a health care provider who specializes in the urinary tract (urologist). In rare cases, urinary tract infections can cause sepsis. Sepsis is a life-threatening condition that occurs when the body responds to an infection. Sepsis is treated in the hospital with IV antibiotics, fluids, and other medicines. Follow these instructions at home:  Medicines Take over-the-counter and prescription medicines only as told by your health care provider. If you were prescribed an antibiotic medicine, take it as told by your health care provider. Do not stop using the antibiotic even if you start to feel better. General instructions Make sure you: Empty your bladder often and completely. Do not hold urine for long periods of time. Empty your bladder after sex. Wipe from front to back after urinating  or having a bowel movement if you are female. Use each tissue only one time when you wipe. Drink enough fluid to keep your urine pale yellow. Keep all follow-up visits. This  is important. Contact a health care provider if: Your symptoms do not get better after 1-2 days. Your symptoms go away and then return. Get help right away if: You have severe pain in your back or your lower abdomen. You have a fever or chills. You have nausea or vomiting. Summary A urinary tract infection (UTI) is an infection of any part of the urinary tract, which includes the kidneys, ureters, bladder, and urethra. Most urinary tract infections are caused by bacteria in your genital area. Treatment for this condition often includes antibiotic medicines. If you were prescribed an antibiotic medicine, take it as told by your health care provider. Do not stop using the antibiotic even if you start to feel better. Keep all follow-up visits. This is important. This information is not intended to replace advice given to you by your health care provider. Make sure you discuss any questions you have with your health care provider. Document Revised: 10/20/2019 Document Reviewed: 10/20/2019 Elsevier Patient Education  [place].

## 2022-02-12 LAB — URINE CULTURE
MICRO NUMBER:: 14212832
SPECIMEN QUALITY:: ADEQUATE

## 2022-02-16 ENCOUNTER — Other Ambulatory Visit: Payer: Self-pay

## 2022-02-16 MED ORDER — METFORMIN HCL 500 MG PO TABS: 500.0000 mg | ORAL_TABLET | Freq: Two times a day (BID) | ORAL | 3 refills | Status: DC

## 2022-03-03 DIAGNOSIS — H43811 Vitreous degeneration, right eye: Secondary | ICD-10-CM | POA: Diagnosis not present

## 2022-03-03 DIAGNOSIS — H2513 Age-related nuclear cataract, bilateral: Secondary | ICD-10-CM | POA: Diagnosis not present

## 2022-03-03 DIAGNOSIS — H35371 Puckering of macula, right eye: Secondary | ICD-10-CM | POA: Diagnosis not present

## 2022-03-03 DIAGNOSIS — H02831 Dermatochalasis of right upper eyelid: Secondary | ICD-10-CM | POA: Diagnosis not present

## 2022-03-03 DIAGNOSIS — H02834 Dermatochalasis of left upper eyelid: Secondary | ICD-10-CM | POA: Diagnosis not present

## 2022-03-03 DIAGNOSIS — H04123 Dry eye syndrome of bilateral lacrimal glands: Secondary | ICD-10-CM | POA: Diagnosis not present

## 2022-03-03 LAB — HM DIABETES EYE EXAM

## 2022-03-04 ENCOUNTER — Other Ambulatory Visit: Payer: Self-pay | Admitting: Obstetrics and Gynecology

## 2022-03-04 DIAGNOSIS — Z1231 Encounter for screening mammogram for malignant neoplasm of breast: Secondary | ICD-10-CM

## 2022-03-10 ENCOUNTER — Telehealth: Payer: Self-pay

## 2022-03-10 NOTE — Telephone Encounter (Signed)
Pt notes she had been treated for UTI on 02/09/22 and had cleared those sxs but now having sxs again,. Requested refill abx I did advise we may need to do another visit as it was a month ago now or possibly a urine sample.  Please advise if we need to schedule

## 2022-03-10 NOTE — Telephone Encounter (Signed)
She has been scheduled she notes she may drop off urine today if she has the time so it can begin processing for sake of time but unsure

## 2022-03-10 NOTE — Telephone Encounter (Signed)
If possible, I would like to see pt tomorrow in one of my same day slots to make sure we're treating appropriately

## 2022-03-11 ENCOUNTER — Ambulatory Visit (INDEPENDENT_AMBULATORY_CARE_PROVIDER_SITE_OTHER): Payer: Medicare PPO | Admitting: Family Medicine

## 2022-03-11 ENCOUNTER — Encounter: Payer: Self-pay | Admitting: Family Medicine

## 2022-03-11 VITALS — BP 128/64 | HR 82 | Temp 98.9°F | Resp 18 | Ht 59.0 in | Wt 147.1 lb

## 2022-03-11 DIAGNOSIS — R300 Dysuria: Secondary | ICD-10-CM

## 2022-03-11 LAB — POCT URINALYSIS DIPSTICK
Glucose, UA: NEGATIVE
Ketones, UA: POSITIVE
Protein, UA: NEGATIVE
Spec Grav, UA: 1.015 (ref 1.010–1.025)
Urobilinogen, UA: 0.2 E.U./dL
pH, UA: 6.5 (ref 5.0–8.0)

## 2022-03-11 MED ORDER — CEPHALEXIN 500 MG PO CAPS: 500.0000 mg | ORAL_CAPSULE | Freq: Two times a day (BID) | ORAL | 0 refills | Status: AC

## 2022-03-11 NOTE — Progress Notes (Signed)
   Subjective:    Patient ID: Heather Gallegos, female    DOB: [birth year], 76 y.o.   MRN: [id number]  HPI Dysuria- sxs started Monday night w/ 'a little bit of discomfort'.  Yesterday, 'it hurt all day'.  Had similar sxs 1 month ago and was tx'd w/ Keflex.  Pt doesn't feel that sxs ever truly resolved at that time.  No nausea/vomiting.  No fevers.  + chills.  No back pain.   Review of Systems For ROS see HPI     Objective:   Physical Exam Vitals reviewed.  Constitutional:      General: She is not in acute distress.    Appearance: Normal appearance. She is well-developed. She is not ill-appearing.  Pulmonary:     Effort: Pulmonary effort is normal. No respiratory distress.  Abdominal:     General: There is no distension.     Palpations: Abdomen is soft.     Tenderness: There is no abdominal tenderness (no suprapubic or CVA tenderness).  Skin:    General: Skin is warm and dry.  Neurological:     General: No focal deficit present.     Mental Status: She is alert and oriented to person, place, and time.           Assessment & Plan:   Dysuria- sxs and UA are consistent w/ infxn.  Repeat Keflex as it has been 1 month since last infxn.  Will send urine for culture and adjust abx prn.  Pt expressed understanding and is in agreement w/ plan.

## 2022-03-11 NOTE — Patient Instructions (Signed)
Follow up as needed or as scheduled START the Cephalexin twice daily Continue to drink LOTS of fluids Call with any questions or concerns Stay Safe!  Stay Healthy! Happy Holidays!!!

## 2022-03-14 LAB — URINE CULTURE
MICRO NUMBER:: 14340308
SPECIMEN QUALITY:: ADEQUATE

## 2022-04-27 ENCOUNTER — Ambulatory Visit
Admission: RE | Admit: 2022-04-27 | Discharge: 2022-04-27 | Disposition: A | Discharge status: Home / Self Care | Payer: Medicare PPO | Source: Ambulatory Visit | Attending: Obstetrics and Gynecology | Admitting: Obstetrics and Gynecology

## 2022-04-27 DIAGNOSIS — Z1231 Encounter for screening mammogram for malignant neoplasm of breast: Secondary | ICD-10-CM

## 2022-05-31 ENCOUNTER — Ambulatory Visit
Admission: EM | Admit: 2022-05-31 | Discharge: 2022-05-31 | Disposition: A | Discharge status: Home / Self Care | Payer: Medicare PPO | Attending: Nurse Practitioner | Admitting: Nurse Practitioner

## 2022-05-31 DIAGNOSIS — N3001 Acute cystitis with hematuria: Secondary | ICD-10-CM | POA: Diagnosis not present

## 2022-05-31 DIAGNOSIS — R300 Dysuria: Secondary | ICD-10-CM | POA: Insufficient documentation

## 2022-05-31 LAB — POCT URINALYSIS DIP (MANUAL ENTRY)
Bilirubin, UA: NEGATIVE
Glucose, UA: NEGATIVE mg/dL
Ketones, POC UA: NEGATIVE mg/dL
Nitrite, UA: POSITIVE — AB
Protein Ur, POC: NEGATIVE mg/dL
Spec Grav, UA: 1.015 (ref 1.010–1.025)
Urobilinogen, UA: 0.2 E.U./dL
pH, UA: 6.5 (ref 5.0–8.0)

## 2022-05-31 MED ORDER — CEPHALEXIN 500 MG PO CAPS: 500.0000 mg | ORAL_CAPSULE | Freq: Two times a day (BID) | ORAL | 0 refills | Status: AC

## 2022-05-31 NOTE — Discharge Instructions (Signed)
Start Keflex twice daily for 7 days The clinic will contact you with results of the urine culture if positive Rest and fluids Follow-up with PCP if symptoms do not improve Please go to the ER for any worsening symptoms

## 2022-05-31 NOTE — ED Triage Notes (Signed)
Pt c/o pain with urination, urinary urgency/ frequency.  Stared: 2 days ago   Home interventions: none

## 2022-05-31 NOTE — ED Provider Notes (Signed)
[facility]    CSN: [id number] Arrival date & time: 05/31/22  1103      History   Chief Complaint Chief Complaint  Patient presents with   Urinary Frequency    HPI Heather Gallegos is a 77 y.o. female presents for evaluation of dysuria.  Patient reports 2 days of urinary burning, urgency, frequency.  Denies hematuria, fevers, nausea/vomiting, flank pain.  No vaginal discharge or STD concern.  Reports remote history of UTIs.  Has not taken any OTC medications.  No other concerns at this time.   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   Gastroesophageal reflux disease 05/17/2020   Internal hemorrhoids 05/17/2020   Personal history of colonic polyps 05/17/2020   Diverticulitis 05/17/2020   Overweight (BMI 25.0-29.9) 09/13/2019   Osteopenia 09/13/2019   Diabetes mellitus type II, controlled, with no complications ([place]) 123456   Indigestion 02/25/2018   Physical exam 08/21/2016   Change of skin color 09/05/2015   Hyperlipidemia    Uterine prolapse     Past Surgical History:  Procedure Laterality Date   TONSILLECTOMY AND ADENOIDECTOMY      OB History     Gravida  2   Para  2   Term  2   Preterm  0   AB  0   Living  2      SAB      IAB      Ectopic      Multiple      Live Births  2            Home Medications    Prior to Admission medications   Medication Sig Start Date End Date Taking? Authorizing Provider  cephALEXin (KEFLEX) 500 MG capsule Take 1 capsule (500 mg total) by mouth 2 (two) times daily for 7 days. 05/31/22  06/07/22 Yes Melynda Ripple, NP  b complex vitamins tablet Take 1 tablet by mouth daily.    [provider]  Biotin 1000 MCG tablet Take 1,000 mcg by mouth daily.    [provider]  Calcium Carbonate (CALCIUM 600 PO) Take by mouth daily.    [provider]  Cetirizine HCl (ZYRTEC PO) Take by mouth as needed.    [provider]  Cholecalciferol (VITAMIN D3) 1000 units CAPS  07/26/17   [provider]  Coenzyme Q10 (COQ10) 100 MG CAPS  06/21/17   [provider]  fluticasone (FLONASE) 50 MCG/ACT nasal spray daily as needed.  12/23/15   [provider]  Glucosamine 500 MG CAPS Take by mouth.    [provider]  Lactobacillus-Inulin (PROBIOTIC DIGESTIVE SUPPORT PO) Take by mouth.    [provider]  LUTEIN PO Take 20 mg by mouth daily.    [provider]  metFORMIN (GLUCOPHAGE) 500 MG tablet Take 1 tablet (500 mg total) by mouth 2 (two) times daily with a meal. 02/16/22   Midge Minium, MD  simvastatin (ZOCOR) 40 MG tablet Take one tablet daily 12/23/21   Annye Asa  E, MD  triamcinolone ointment (KENALOG) 0.1 % Apply 1 Application topically 2 (two) times daily. 01/29/22 01/29/23  Midge Minium, MD  vitamin C (ASCORBIC ACID) 500 MG tablet Take 500 mg by mouth daily.    [provider]  vitamin E 400 UNIT capsule Take 400 Units by mouth daily.    [provider]    Family History Family History  Problem Relation Age of Onset   Cancer Mother 32       Stomach cancer   Hypertension Brother    Diabetes Brother        Type II   Osteoporosis Maternal Aunt    Hypertension Father    Stroke Father     Social History Social History   Tobacco Use   Smoking status: Never   Smokeless tobacco: Never  Vaping Use   Vaping Use: Never used  Substance Use Topics   Alcohol use: No    Alcohol/week: 0.0 standard drinks of alcohol   Drug use: No     Allergies   Patient has no known  allergies.   Review of Systems Review of Systems  Genitourinary:  Positive for dysuria and frequency.     Physical Exam Triage Vital Signs ED Triage Vitals  Enc Vitals Group     BP 05/31/22 1116 (!) 157/88     Pulse Rate 05/31/22 1116 96     Resp 05/31/22 1116 16     Temp 05/31/22 1116 98.6 F (37 C)     Temp Source 05/31/22 1116 Oral     SpO2 05/31/22 1116 94 %     Weight --      Height --      Head Circumference --      Peak Flow --      Pain Score 05/31/22 1115 7     Pain Loc --      Pain Edu? --      Excl. in [place]? --    No data found.  Updated Vital Signs BP (!) 157/88 (BP Location: Left Arm)   Pulse 96   Temp 98.6 F (37 C) (Oral)   Resp 16   LMP 03/23/1998 (LMP Unknown)   SpO2 94%   Visual Acuity Right Eye Distance:   Left Eye Distance:   Bilateral Distance:    Right Eye Near:   Left Eye Near:    Bilateral Near:     Physical Exam Vitals and nursing note reviewed.  Constitutional:      Appearance: Normal appearance.  HENT:     Head: Normocephalic and atraumatic.  Eyes:     Pupils: Pupils are equal, round, and reactive to light.  Cardiovascular:     Rate and Rhythm: Normal rate.  Pulmonary:     Effort: Pulmonary effort is normal.  Abdominal:     Tenderness: There is no right CVA tenderness or left CVA tenderness.  Skin:    General: Skin is warm and dry.  Neurological:     General: No focal deficit present.     Mental Status: She is alert and oriented to person, place, and time.  Psychiatric:        Mood and Affect: Mood normal.        Behavior: Behavior normal.      UC Treatments / Results  Labs (all labs ordered are listed, but only abnormal results are displayed) Labs Reviewed  POCT URINALYSIS DIP (MANUAL ENTRY) - Abnormal; Notable for the following components:  Result Value   Color, UA light yellow (*)    Clarity, UA cloudy (*)    Blood, UA moderate (*)    Nitrite, UA Positive (*)    Leukocytes, UA Large (3+) (*)    All  other components within normal limits  URINE CULTURE    EKG   Radiology No results found.  Procedures Procedures (including critical care time)  Medications Ordered in UC Medications - No data to display  Initial Impression / Assessment and Plan / UC Course  I have reviewed the triage vital signs and the nursing notes.  Pertinent labs & imaging results that were available during my care of the patient were reviewed by me and considered in my medical decision making (see chart for details).     UA positive for UTI, start Keflex and will send urine culture Increase fluids PCP follow-up if symptoms do not improve ER precautions reviewed Final Clinical Impressions(s) / UC Diagnoses   Final diagnoses:  Dysuria  Acute cystitis with hematuria     Discharge Instructions      Start Keflex twice daily for 7 days The clinic will contact you with results of the urine culture if positive Rest and fluids Follow-up with PCP if symptoms do not improve Please go to the ER for any worsening symptoms   ED Prescriptions     Medication Sig Dispense Auth. Provider   cephALEXin (KEFLEX) 500 MG capsule Take 1 capsule (500 mg total) by mouth 2 (two) times daily for 7 days. 14 capsule Melynda Ripple, NP      PDMP not reviewed this encounter.   Melynda Ripple, NP 05/31/22 1133

## 2022-06-02 LAB — URINE CULTURE: Culture: >=100000 — AB

## 2022-06-09 ENCOUNTER — Encounter: Payer: Self-pay | Admitting: Family Medicine

## 2022-06-09 ENCOUNTER — Ambulatory Visit (INDEPENDENT_AMBULATORY_CARE_PROVIDER_SITE_OTHER): Payer: Medicare PPO | Admitting: Family Medicine

## 2022-06-09 VITALS — BP 122/74 | HR 85 | Temp 98.4°F | Resp 16 | Ht 59.0 in | Wt 147.0 lb

## 2022-06-09 DIAGNOSIS — R300 Dysuria: Secondary | ICD-10-CM | POA: Diagnosis not present

## 2022-06-09 DIAGNOSIS — N390 Urinary tract infection, site not specified: Secondary | ICD-10-CM

## 2022-06-09 LAB — POCT URINALYSIS DIPSTICK
Bilirubin, UA: NEGATIVE
Blood, UA: NEGATIVE
Glucose, UA: NEGATIVE
Ketones, UA: NEGATIVE
Leukocytes, UA: NEGATIVE
Protein, UA: NEGATIVE
Spec Grav, UA: 1.025 (ref 1.010–1.025)
Urobilinogen, UA: 0.2 E.U./dL
pH, UA: 6 (ref 5.0–8.0)

## 2022-06-09 MED ORDER — ESTRADIOL 10 MCG VA TABS: 1.0000 | ORAL_TABLET | VAGINAL | 3 refills | Status: DC

## 2022-06-09 NOTE — Progress Notes (Signed)
   Subjective:    Patient ID: Heather Gallegos, female    DOB: 08/22/1945, 77 y.o.   MRN: [id number]  HPI UTI- pt was seen on 3/10 at [facility] and dx'd w/ UTI.  Tx'd w/ Keflex 500mg  BID.  Pt reports sxs improved w/ abx but doesn't feel that infxn has resolved.  Continues to have burning w/ urination.  This is 3rd episode since November and prior to that had not had a UTI 'in 25 yrs'.    Review of Systems For ROS see HPI     Objective:   Physical Exam Vitals reviewed.  Constitutional:      General: She is not in acute distress.    Appearance: She is well-developed.  Abdominal:     General: There is no distension.     Palpations: Abdomen is soft.     Tenderness: There is no abdominal tenderness (no suprapubic or CVA tenderness).  Skin:    General: Skin is warm and dry.  Neurological:     General: No focal deficit present.     Mental Status: She is oriented to person, place, and time.  Psychiatric:        Mood and Affect: Mood normal.        Behavior: Behavior normal.        Thought Content: Thought content normal.           Assessment & Plan:   Recurrent UTI- new.  This is pt's 3rd infection since November and she reports prior to that it had been nearly 25 yrs since she had one.  Discussed that this is common in post-menopausal women and that vaginal estrogen can help.  Pt would like to try this.  Instructed her on use.  Will follow up on urine culture to see if infxn is still present prior to restarting abx.  Pt expressed understanding and is in agreement w/ plan.

## 2022-06-09 NOTE — Patient Instructions (Signed)
Follow up as needed or as scheduled START the vaginal estrogen twice weekly to try and prevent future UTI's We'll notify you of your urine culture and treat if needed Continue to drink LOTS of water to flush your system Call with any questions or concerns Hang in there!!

## 2022-06-10 LAB — URINE CULTURE
MICRO NUMBER:: 14711802
SPECIMEN QUALITY:: ADEQUATE

## 2022-06-11 ENCOUNTER — Telehealth: Payer: Self-pay

## 2022-06-11 NOTE — Telephone Encounter (Signed)
Left pt a vm w/ lab results

## 2022-06-11 NOTE — Telephone Encounter (Signed)
-----   Message from Midge Minium, MD sent at 06/11/2022  7:30 AM EDT ----- No evidence of UTI.  I suspect that the symptoms you are having are related to the lack of estrogen.  Hopefully this will improve.  Please keep me updated

## 2022-06-21 ENCOUNTER — Other Ambulatory Visit: Payer: Self-pay | Admitting: Family Medicine

## 2022-06-21 DIAGNOSIS — E785 Hyperlipidemia, unspecified: Secondary | ICD-10-CM

## 2022-07-16 ENCOUNTER — Other Ambulatory Visit: Payer: Self-pay

## 2022-07-16 MED ORDER — METFORMIN HCL 500 MG PO TABS: 500.0000 mg | ORAL_TABLET | Freq: Two times a day (BID) | ORAL | 3 refills | Status: DC

## 2022-08-04 ENCOUNTER — Telehealth: Payer: Self-pay | Admitting: Family Medicine

## 2022-08-04 NOTE — Telephone Encounter (Signed)
Contacted Heather Gallegos to schedule their annual wellness visit. Appointment made for 08/19/2022.  Thank you,  [facility] Support [facility] Direct dial  [phone]

## 2022-08-19 ENCOUNTER — Ambulatory Visit (INDEPENDENT_AMBULATORY_CARE_PROVIDER_SITE_OTHER): Payer: Medicare PPO | Admitting: *Deleted

## 2022-08-19 DIAGNOSIS — Z0000 Encounter for general adult medical examination without abnormal findings: Secondary | ICD-10-CM

## 2022-08-19 NOTE — Progress Notes (Signed)
Subjective:   Heather Gallegos is a 77 y.o. female who presents for Medicare Annual (Subsequent) preventive examination.  I connected with  Carylon Perches on 08/19/22 by a telephone enabled telemedicine application and verified that I am speaking with the correct person using two identifiers.   I discussed the limitations of evaluation and management by telemedicine. The patient expressed understanding and agreed to proceed.  Patient location: home  Provider location: telephone/home    Review of Systems     Cardiac Risk Factors include: advanced age (>27men, >35 women);diabetes mellitus     Objective:    Today's Vitals   There is no height or weight on file to calculate BMI.     08/19/2022    9:38 AM 11/13/2021    9:14 AM 09/16/2020    1:35 PM 08/25/2017    8:18 AM 06/11/2015   10:16 AM  Advanced Directives  Does Patient Have a Medical Advance Directive? Yes Yes No No No  Type of Advance Directive Healthcare Power of Attorney Living will     Copy of Healthcare Power of Attorney in Chart? No - copy requested      Would patient like information on creating a medical advance directive?   No - Patient declined No - Patient declined No - patient declined information    Current Medications (verified) Outpatient Encounter Medications as of 08/19/2022  Medication Sig   b complex vitamins tablet Take 1 tablet by mouth daily.   Biotin 1000 MCG tablet Take 1,000 mcg by mouth daily.   Calcium Carbonate (CALCIUM 600 PO) Take by mouth daily.   Cetirizine HCl (ZYRTEC PO) Take by mouth as needed.   Cholecalciferol (VITAMIN D3) 1000 units CAPS    Coenzyme Q10 (COQ10) 100 MG CAPS    Estradiol 10 MCG TABS vaginal tablet Place 1 tablet (10 mcg total) vaginally 2 (two) times a week.   fluticasone (FLONASE) 50 MCG/ACT nasal spray daily as needed.    Glucosamine 500 MG CAPS Take by mouth.   Lactobacillus-Inulin (PROBIOTIC DIGESTIVE SUPPORT PO) Take by mouth.   LUTEIN PO Take 20 mg by mouth  daily.   metFORMIN (GLUCOPHAGE) 500 MG tablet Take 1 tablet (500 mg total) by mouth 2 (two) times daily with a meal.   simvastatin (ZOCOR) 40 MG tablet TAKE 1 TABLET BY MOUTH DAILY   triamcinolone ointment (KENALOG) 0.1 % Apply 1 Application topically 2 (two) times daily.   vitamin C (ASCORBIC ACID) 500 MG tablet Take 500 mg by mouth daily.   vitamin E 400 UNIT capsule Take 400 Units by mouth daily.   No facility-administered encounter medications on file as of 08/19/2022.    Allergies (verified) Patient has no known allergies.   History: Past Medical History:  Diagnosis Date   Chronic sinusitis    Clostridium difficile infection 11/2015   Elevated cholesterol    Environmental allergies    Fibroid    GERD (gastroesophageal reflux disease)    Hypercholesteremia    Leg edema, left    Low back pain    Uterine prolapse 2010   Past Surgical History:  Procedure Laterality Date   TONSILLECTOMY AND ADENOIDECTOMY     Family History  Problem Relation Age of Onset   Cancer Mother 2       Stomach cancer   Hypertension Brother    Diabetes Brother        Type II   Osteoporosis Maternal Aunt    Hypertension Father    Stroke Father  Social History   Socioeconomic History   Marital status: Married    Spouse name: Not on file   Number of children: Not on file   Years of education: Not on file   Highest education level: Not on file  Occupational History   Not on file  Tobacco Use   Smoking status: Never   Smokeless tobacco: Never  Vaping Use   Vaping Use: Never used  Substance and Sexual Activity   Alcohol use: No    Alcohol/week: 0.0 standard drinks of alcohol   Drug use: No   Sexual activity: Yes    Partners: Male    Birth control/protection: Post-menopausal  Other Topics Concern   Not on file  Social History Narrative   Not on file   Social Determinants of Health   Financial Resource Strain: Low Risk  (08/19/2022)   Overall Financial Resource Strain (CARDIA)     Difficulty of Paying Living Expenses: Not hard at all  Food Insecurity: No Food Insecurity (08/19/2022)   Hunger Vital Sign    Worried About Running Out of Food in the Last Year: Never true    Ran Out of Food in the Last Year: Never true  Transportation Needs: No Transportation Needs (08/19/2022)   PRAPARE - Administrator, Civil Service (Medical): No    Lack of Transportation (Non-Medical): No  Physical Activity: Sufficiently Active (08/19/2022)   Exercise Vital Sign    Days of Exercise per Week: 4 days    Minutes of Exercise per Session: 40 min  Stress: No Stress Concern Present (08/19/2022)   [employer] of Occupational Health - Occupational Stress Questionnaire    Feeling of Stress : Not at all  Social Connections: Socially Integrated (08/19/2022)   Social Connection and Isolation Panel [NHANES]    Frequency of Communication with Friends and Family: More than three times a week    Frequency of Social Gatherings with Friends and Family: More than three times a week    Attends Religious Services: More than 4 times per year    Active Member of [employer] or Organizations: Yes    Attends Engineer, structural: More than 4 times per year    Marital Status: Married    Tobacco Counseling Counseling given: Not Answered   Clinical Intake:  Pre-visit preparation completed: Yes  Pain : No/denies pain     Diabetes: Yes CBG done?: No Did pt. bring in CBG monitor from home?: No  How often do you need to have someone help you when you read instructions, pamphlets, or other written materials from your doctor or pharmacy?: 1 - Never  Diabetic?  Yes     on metformin 6.9 A!C  Interpreter Needed?: No  Information entered by :: Remi Haggard LPN   Activities of Daily Living    08/19/2022    9:52 AM 08/19/2022    9:37 AM  In your present state of health, do you have any difficulty performing the following activities:  Hearing? 0 0  Vision? 0 0  Difficulty  concentrating or making decisions? 0 0  Walking or climbing stairs? 0 0  Dressing or bathing? 0 0  Doing errands, shopping? 0 0  Preparing Food and eating ?  N  Using the Toilet?  N  In the past six months, have you accidently leaked urine?  Y  Do you have problems with loss of bowel control?  N  Managing your Medications?  N  Managing your Finances?  N  Housekeeping or managing your Housekeeping?  N    Patient Care Team: Sheliah Hatch, MD as PCP - General (Family Medicine) Charna Elizabeth, MD as Consulting Physician (Gastroenterology) Romualdo Bolk, MD as Consulting Physician (Obstetrics and Gynecology) [facility] as Consulting Physician  Indicate any recent Medical Services you may have received from other than [facility] providers in the past year (date may be approximate).     Assessment:   This is a routine wellness examination for Kathline.  Hearing/Vision screen Hearing Screening - Comments:: No trouble hearing Vision Screening - Comments:: Up to date [facility]  Dietary issues and exercise activities discussed:     Goals Addressed             This Visit's Progress    Weight (lb) < 200 lb (90.7 kg)       Ore active [employer] book       Depression Screen    08/19/2022    9:47 AM 06/09/2022    9:35 AM 03/11/2022    9:38 AM 02/09/2022   11:52 AM 01/29/2022    8:02 AM 11/13/2021    9:15 AM 11/13/2021    9:13 AM  PHQ 2/9 Scores  PHQ - 2 Score 0 0 0 0 0 0 0  PHQ- 9 Score 0 0 0 0 0      Fall Risk    08/19/2022    9:48 AM 08/18/2022   11:10 PM 06/09/2022    9:35 AM 03/11/2022    9:38 AM 02/09/2022   11:52 AM  Fall Risk   Falls in the past year? 0 0 0 0 0  Number falls in past yr: 0 0 0  0  Injury with Fall? 0 0 0  0  Risk for fall due to :   No Fall Risks No Fall Risks No Fall Risks  Follow up Falls evaluation completed;Education provided;Falls prevention discussed   Falls evaluation completed Falls evaluation completed    FALL  RISK PREVENTION PERTAINING TO THE HOME:  Any stairs in or around the home? Yes  If so, are there any without handrails? No  Home free of loose throw rugs in walkways, pet beds, electrical cords, etc? Yes  Adequate lighting in your home to reduce risk of falls? Yes   ASSISTIVE DEVICES UTILIZED TO PREVENT FALLS:  Life alert? No  Use of a cane, walker or w/c? No  Grab bars in the bathroom? Yes  Shower chair or bench in shower? No  Elevated toilet seat or a handicapped toilet? No   TIMED UP AND GO:  Was the test performed? No .    Cognitive Function:    08/25/2017    8:19 AM  MMSE - Mini Mental State Exam  Orientation to time 5  Orientation to Place 5  Registration 3  Attention/ Calculation 5  Recall 3  Language- name 2 objects 2  Language- repeat 1  Language- follow 3 step command 3  Language- read & follow direction 1  Write a sentence 1  Copy design 1  Total score 30        08/19/2022    9:36 AM 11/13/2021    9:18 AM  6CIT Screen  What Year? 0 points 0 points  What month? 0 points 0 points  What time? 0 points 0 points  Count back from 20  0 points  Months in reverse  0 points  Repeat phrase  0 points  Total Score  0 points  Immunizations Immunization History  Administered Date(s) Administered   Fluad Quad(high Dose 65+) 12/28/2018, 03/20/2021   Influenza Split 01/23/2020   Influenza,inj,Quad PF,6+ Mos 02/26/2015, 12/05/2015, 02/16/2017, 11/25/2017   Influenza-Unspecified 01/18/2022   MODERNA COVID-19 SARS-COV-2 PEDS BIVALENT BOOSTER 6Y-11Y 08/29/2020   Moderna SARS-COV2 Booster Vaccination 08/29/2020   Moderna Sars-Covid-2 Vaccination 05/06/2019, 05/07/2019, 06/04/2019, 04/05/2020   Pneumococcal Conjugate-13 08/21/2016   Pneumococcal Polysaccharide-23 10/29/2010   Tdap 12/23/2009   Zoster Recombinat (Shingrix) 08/21/2017, 11/25/2017   Zoster, Live 12/23/2009    TDAP status: Due, Education has been provided regarding the importance of this vaccine.  Advised may receive this vaccine at local pharmacy or Health Dept. Aware to provide a copy of the vaccination record if obtained from local pharmacy or Health Dept. Verbalized acceptance and understanding.  Flu Vaccine status: Up to date  Pneumococcal vaccine status: Up to date  Covid-19 vaccine status: Information provided on how to obtain vaccines.   Qualifies for Shingles Vaccine? No   Zostavax completed Yes   Shingrix Completed?: Yes  Screening Tests Health Maintenance  Topic Date Due   HEMOGLOBIN A1C  07/30/2022   INFLUENZA VACCINE  10/22/2022   FOOT EXAM  10/24/2022   Diabetic kidney evaluation - eGFR measurement  01/30/2023   Diabetic kidney evaluation - Urine ACR  01/30/2023   OPHTHALMOLOGY EXAM  02/27/2023   Colonoscopy  03/01/2023   MAMMOGRAM  04/28/2023   Medicare Annual Wellness (AWV)  08/19/2023   Pneumonia Vaccine 11+ Years old  Completed   DEXA SCAN  Completed   Hepatitis C Screening  Completed   Zoster Vaccines- Shingrix  Completed   HPV VACCINES  Aged Out   DTaP/Tdap/Td  Discontinued   COVID-19 Vaccine  Discontinued    Health Maintenance  Health Maintenance Due  Topic Date Due   HEMOGLOBIN A1C  07/30/2022    Colorectal cancer screening: Type of screening: Colonoscopy. Completed 2019. Repeat every 5 years  Mammogram status: Completed  . Repeat every year  Bone Density status: Completed 2021. Results reflect: Bone density results: OSTEOPENIA. Repeat every 3 years.  Lung Cancer Screening: (Low Dose CT Chest recommended if Age 24-80 years, 30 pack-year currently smoking OR have quit w/in 15years.) does not qualify.   Lung Cancer Screening Referral:   Additional Screening:  Hepatitis C Screening: does not qualify; Completed 2017  Vision Screening: Recommended annual ophthalmology exams for early detection of glaucoma and other disorders of the eye. Is the patient up to date with their annual eye exam?  Yes  Who is the provider or what is the name of  the office in which the patient attends annual eye exams? [facility] If pt is not established with a provider, would they like to be referred to a provider to establish care? No .   Dental Screening: Recommended annual dental exams for proper oral hygiene  Community Resource Referral / Chronic Care Management: CRR required this visit?  No   CCM required this visit?  No      Plan:     I have personally reviewed and noted the following in the patient's chart:   Medical and social history Use of alcohol, tobacco or illicit drugs  Current medications and supplements including opioid prescriptions. Patient is not currently taking opioid prescriptions. Functional ability and status Nutritional status Physical activity Advanced directives List of other physicians Hospitalizations, surgeries, and ER visits in previous 12 months Vitals Screenings to include cognitive, depression, and falls Referrals and appointments  In addition, I have reviewed and discussed  with patient certain preventive protocols, quality metrics, and best practice recommendations. A written personalized care plan for preventive services as well as general preventive health recommendations were provided to patient.     Remi Haggard, LPN   06/29/8117   Nurse Notes:

## 2022-08-19 NOTE — Patient Instructions (Signed)
Heather Gallegos , Thank you for taking time to come for your Medicare Wellness Visit. I appreciate your ongoing commitment to your health goals. Please review the following plan we discussed and let me know if I can assist you in the future.   Screening recommendations/referrals: Colonoscopy: up to date Mammogram: up to date Bone Density: up to date Recommended yearly ophthalmology/optometry visit for glaucoma screening and checkup Recommended yearly dental visit for hygiene and checkup  Vaccinations: Influenza vaccine: up to date Pneumococcal vaccine: up to date Tdap vaccine: Education provided Shingles vaccine: up to date       Preventive Care 65 Years and Older, Female Preventive care refers to lifestyle choices and visits with your health care provider that can promote health and wellness. What does preventive care include? A yearly physical exam. This is also called an annual well check. Dental exams once or twice a year. Routine eye exams. Ask your health care provider how often you should have your eyes checked. Personal lifestyle choices, including: Daily care of your teeth and gums. Regular physical activity. Eating a healthy diet. Avoiding tobacco and drug use. Limiting alcohol use. Practicing safe sex. Taking low-dose aspirin every day. Taking vitamin and mineral supplements as recommended by your health care provider. What happens during an annual well check? The services and screenings done by your health care provider during your annual well check will depend on your age, overall health, lifestyle risk factors, and family history of disease. Counseling  Your health care provider may ask you questions about your: Alcohol use. Tobacco use. Drug use. Emotional well-being. Home and relationship well-being. Sexual activity. Eating habits. History of falls. Memory and ability to understand (cognition). Work and work Astronomer. Reproductive health. Screening   You may have the following tests or measurements: Height, weight, and BMI. Blood pressure. Lipid and cholesterol levels. These may be checked every 5 years, or more frequently if you are over 60 years old. Skin check. Lung cancer screening. You may have this screening every year starting at age 29 if you have a 30-pack-year history of smoking and currently smoke or have quit within the past 15 years. Fecal occult blood test (FOBT) of the stool. You may have this test every year starting at age 48. Flexible sigmoidoscopy or colonoscopy. You may have a sigmoidoscopy every 5 years or a colonoscopy every 10 years starting at age 10. Hepatitis C blood test. Hepatitis B blood test. Sexually transmitted disease (STD) testing. Diabetes screening. This is done by checking your blood sugar (glucose) after you have not eaten for a while (fasting). You may have this done every 1-3 years. Bone density scan. This is done to screen for osteoporosis. You may have this done starting at age 55. Mammogram. This may be done every 1-2 years. Talk to your health care provider about how often you should have regular mammograms. Talk with your health care provider about your test results, treatment options, and if necessary, the need for more tests. Vaccines  Your health care provider may recommend certain vaccines, such as: Influenza vaccine. This is recommended every year. Tetanus, diphtheria, and acellular pertussis (Tdap, Td) vaccine. You may need a Td booster every 10 years. Zoster vaccine. You may need this after age 109. Pneumococcal 13-valent conjugate (PCV13) vaccine. One dose is recommended after age 83. Pneumococcal polysaccharide (PPSV23) vaccine. One dose is recommended after age 84. Talk to your health care provider about which screenings and vaccines you need and how often you need them. This  information is not intended to replace advice given to you by your health care provider. Make sure you discuss  any questions you have with your health care provider. Document Released: 04/05/2015 Document Revised: 11/27/2015 Document Reviewed: 01/08/2015 Elsevier Interactive Patient Education  2017 [employer].  Fall Prevention in the Home Falls can cause injuries. They can happen to people of all ages. There are many things you can do to make your home safe and to help prevent falls. What can I do on the outside of my home? Regularly fix the edges of walkways and driveways and fix any cracks. Remove anything that might make you trip as you walk through a door, such as a raised step or threshold. Trim any bushes or trees on the path to your home. Use bright outdoor lighting. Clear any walking paths of anything that might make someone trip, such as rocks or tools. Regularly check to see if handrails are loose or broken. Make sure that both sides of any steps have handrails. Any raised decks and porches should have guardrails on the edges. Have any leaves, snow, or ice cleared regularly. Use sand or salt on walking paths during winter. Clean up any spills in your garage right away. This includes oil or grease spills. What can I do in the bathroom? Use night lights. Install grab bars by the toilet and in the tub and shower. Do not use towel bars as grab bars. Use non-skid mats or decals in the tub or shower. If you need to sit down in the shower, use a plastic, non-slip stool. Keep the floor dry. Clean up any water that spills on the floor as soon as it happens. Remove soap buildup in the tub or shower regularly. Attach bath mats securely with double-sided non-slip rug tape. Do not have throw rugs and other things on the floor that can make you trip. What can I do in the bedroom? Use night lights. Make sure that you have a light by your bed that is easy to reach. Do not use any sheets or blankets that are too big for your bed. They should not hang down onto the floor. Have a firm chair that has  side arms. You can use this for support while you get dressed. Do not have throw rugs and other things on the floor that can make you trip. What can I do in the kitchen? Clean up any spills right away. Avoid walking on wet floors. Keep items that you use a lot in easy-to-reach places. If you need to reach something above you, use a strong step stool that has a grab bar. Keep electrical cords out of the way. Do not use floor polish or wax that makes floors slippery. If you must use wax, use non-skid floor wax. Do not have throw rugs and other things on the floor that can make you trip. What can I do with my stairs? Do not leave any items on the stairs. Make sure that there are handrails on both sides of the stairs and use them. Fix handrails that are broken or loose. Make sure that handrails are as long as the stairways. Check any carpeting to make sure that it is firmly attached to the stairs. Fix any carpet that is loose or worn. Avoid having throw rugs at the top or bottom of the stairs. If you do have throw rugs, attach them to the floor with carpet tape. Make sure that you have a light switch at the top  of the stairs and the bottom of the stairs. If you do not have them, ask someone to add them for you. What else can I do to help prevent falls? Wear shoes that: Do not have high heels. Have rubber bottoms. Are comfortable and fit you well. Are closed at the toe. Do not wear sandals. If you use a stepladder: Make sure that it is fully opened. Do not climb a closed stepladder. Make sure that both sides of the stepladder are locked into place. Ask someone to hold it for you, if possible. Clearly mark and make sure that you can see: Any grab bars or handrails. First and last steps. Where the edge of each step is. Use tools that help you move around (mobility aids) if they are needed. These include: Canes. Walkers. Scooters. Crutches. Turn on the lights when you go into a dark area.  Replace any light bulbs as soon as they burn out. Set up your furniture so you have a clear path. Avoid moving your furniture around. If any of your floors are uneven, fix them. If there are any pets around you, be aware of where they are. Review your medicines with your doctor. Some medicines can make you feel dizzy. This can increase your chance of falling. Ask your doctor what other things that you can do to help prevent falls. This information is not intended to replace advice given to you by your health care provider. Make sure you discuss any questions you have with your health care provider. Document Released: 01/03/2009 Document Revised: 08/15/2015 Document Reviewed: 04/13/2014 Elsevier Interactive Patient Education  2017 [employer].

## 2022-09-04 ENCOUNTER — Encounter: Payer: Self-pay | Admitting: Family Medicine

## 2022-09-04 ENCOUNTER — Ambulatory Visit (INDEPENDENT_AMBULATORY_CARE_PROVIDER_SITE_OTHER): Payer: Medicare PPO | Admitting: Family Medicine

## 2022-09-04 VITALS — BP 128/70 | HR 85 | Temp 98.2°F | Ht 59.0 in | Wt 146.6 lb

## 2022-09-04 DIAGNOSIS — B9689 Other specified bacterial agents as the cause of diseases classified elsewhere: Secondary | ICD-10-CM

## 2022-09-04 DIAGNOSIS — B029 Zoster without complications: Secondary | ICD-10-CM

## 2022-09-04 DIAGNOSIS — J019 Acute sinusitis, unspecified: Secondary | ICD-10-CM

## 2022-09-04 MED ORDER — AMOXICILLIN 875 MG PO TABS: 875.0000 mg | ORAL_TABLET | Freq: Two times a day (BID) | ORAL | 0 refills | Status: AC

## 2022-09-04 MED ORDER — GUAIFENESIN-CODEINE 100-10 MG/5ML PO SYRP: 10.0000 mL | ORAL_SOLUTION | Freq: Three times a day (TID) | ORAL | 0 refills | Status: DC | PRN

## 2022-09-04 NOTE — Progress Notes (Signed)
   Subjective:    Patient ID: Heather Gallegos, female    DOB: [birth year], 77 y.o.   MRN: [id number]  HPI URI- sxs started ~1 week ago.  'my sinuses are hurting'.  + congestion, drainage, cough.  No fevers.  + body aches.  No chills.  No ear pain.  No tooth pain.  No N/V.  No known sick contacts.    Skin sore- under R breast, first noticed ~3 weeks ago.  Area is not currently painful.  No drainage.  Area was initially itchy and TTP.  No other similar areas.     Review of Systems For ROS see HPI     Objective:   Physical Exam Vitals reviewed.  Constitutional:      General: She is not in acute distress.    Appearance: She is well-developed. She is not ill-appearing.  HENT:     Head: Normocephalic and atraumatic.     Right Ear: Tympanic membrane normal.     Left Ear: Tympanic membrane normal.     Nose: Mucosal edema and congestion present. No rhinorrhea.     Right Sinus: Maxillary sinus tenderness and frontal sinus tenderness present.     Left Sinus: Maxillary sinus tenderness and frontal sinus tenderness present.     Mouth/Throat:     Pharynx: Uvula midline. Posterior oropharyngeal erythema present. No oropharyngeal exudate.  Eyes:     Conjunctiva/sclera: Conjunctivae normal.     Pupils: Pupils are equal, round, and reactive to light.  Cardiovascular:     Rate and Rhythm: Normal rate and regular rhythm.     Heart sounds: Normal heart sounds.  Pulmonary:     Effort: Pulmonary effort is normal. No respiratory distress.     Breath sounds: Normal breath sounds. No wheezing.  Musculoskeletal:     Cervical back: Normal range of motion and neck supple.  Lymphadenopathy:     Cervical: No cervical adenopathy.  Skin:    General: Skin is warm and dry.     Findings: Lesion (hyperpigmented area of resolving vesicles under R breast) present.  Neurological:     General: No focal deficit present.     Mental Status: She is alert and oriented to person, place, and time.     Cranial Nerves: No  cranial nerve deficit.     Motor: No weakness.     Coordination: Coordination normal.  Psychiatric:        Mood and Affect: Mood normal.        Behavior: Behavior normal.        Thought Content: Thought content normal.           Assessment & Plan:  Bacterial sinusitis- pt has hx of similar and this feels consistent w/ previous infxns.  PE also consistent w/ infxn.  Start Amoxicillin 875mg  BID, codeine cough syrup as needed- particularly at night.  Reviewed supportive care and red flags that should prompt return.  Pt expressed understanding and is in agreement w/ plan.   Shingles- new.  The area under her R breast that was initially painful and itchy is actually a resolving patch of vesicles that is most consistent w/ shingles.  Since pt had vaccines, distribution and severity was much less.  Encouraged her to use topical steroids to help w/ pigmentation and residual itch.  Pt expressed understanding and is in agreement w/ plan.

## 2022-09-04 NOTE — Patient Instructions (Signed)
Schedule your complete physical in 1 month START Amoxicillin twice daily for the sinus infection- take w/ food Use the cough syrup as needed- may cause drowsiness Drink LOTS of fluids to rinse the drainage off the back of the throat Continue to use hydrocortisone or triamcinolone on the spots Call with any questions or concerns Hang in there!!!

## 2022-10-06 ENCOUNTER — Ambulatory Visit (INDEPENDENT_AMBULATORY_CARE_PROVIDER_SITE_OTHER): Payer: Medicare PPO | Admitting: Family Medicine

## 2022-10-06 ENCOUNTER — Encounter: Payer: Self-pay | Admitting: Family Medicine

## 2022-10-06 VITALS — BP 118/66 | HR 78 | Temp 97.9°F | Ht 58.5 in | Wt 147.8 lb

## 2022-10-06 DIAGNOSIS — E119 Type 2 diabetes mellitus without complications: Secondary | ICD-10-CM

## 2022-10-06 DIAGNOSIS — Z0000 Encounter for general adult medical examination without abnormal findings: Secondary | ICD-10-CM

## 2022-10-06 DIAGNOSIS — M8580 Other specified disorders of bone density and structure, unspecified site: Secondary | ICD-10-CM

## 2022-10-06 LAB — CBC WITH DIFFERENTIAL/PLATELET
Basophils Absolute: 0 10*3/uL (ref 0.0–0.1)
Basophils Relative: 0.8 % (ref 0.0–3.0)
Eosinophils Absolute: 0.2 10*3/uL (ref 0.0–0.7)
Eosinophils Relative: 4.5 % (ref 0.0–5.0)
HCT: 40.8 % (ref 36.0–46.0)
Hemoglobin: 13.5 g/dL (ref 12.0–15.0)
Lymphocytes Relative: 29 % (ref 12.0–46.0)
Lymphs Abs: 1.5 10*3/uL (ref 0.7–4.0)
MCHC: 33.1 g/dL (ref 30.0–36.0)
MCV: 89 fl (ref 78.0–100.0)
Monocytes Absolute: 0.5 10*3/uL (ref 0.1–1.0)
Monocytes Relative: 8.9 % (ref 3.0–12.0)
Neutro Abs: 3 10*3/uL (ref 1.4–7.7)
Neutrophils Relative %: 56.8 % (ref 43.0–77.0)
Platelets: 290 10*3/uL (ref 150.0–400.0)
RBC: 4.59 Mil/uL (ref 3.87–5.11)
RDW: 13.7 % (ref 11.5–15.5)
WBC: 5.3 10*3/uL (ref 4.0–10.5)

## 2022-10-06 LAB — LIPID PANEL
Cholesterol: 155 mg/dL (ref 0–200)
HDL: 46.1 mg/dL (ref >39.00)
LDL Cholesterol: 81 mg/dL (ref 0–99)
NonHDL: 109.39
Total CHOL/HDL Ratio: 3
Triglycerides: 141 mg/dL (ref 0.0–149.0)
VLDL: 28.2 mg/dL (ref 0.0–40.0)

## 2022-10-06 LAB — HEPATIC FUNCTION PANEL
ALT: 22 U/L (ref 0–35)
AST: 20 U/L (ref 0–37)
Albumin: 4.2 g/dL (ref 3.5–5.2)
Alkaline Phosphatase: 48 U/L (ref 39–117)
Bilirubin, Direct: 0.1 mg/dL (ref 0.0–0.3)
Total Bilirubin: 0.3 mg/dL (ref 0.2–1.2)
Total Protein: 6.9 g/dL (ref 6.0–8.3)

## 2022-10-06 LAB — HEMOGLOBIN A1C: Hgb A1c MFr Bld: 6.7 % — ABNORMAL HIGH (ref 4.6–6.5)

## 2022-10-06 LAB — BASIC METABOLIC PANEL
BUN: 20 mg/dL (ref 6–23)
CO2: 28 mEq/L (ref 19–32)
Calcium: 9.8 mg/dL (ref 8.4–10.5)
Chloride: 104 mEq/L (ref 96–112)
Creatinine, Ser: 0.92 mg/dL (ref 0.40–1.20)
GFR: 60.19 mL/min (ref >60.00)
Glucose, Bld: 111 mg/dL — ABNORMAL HIGH (ref 70–99)
Potassium: 4.5 mEq/L (ref 3.5–5.1)
Sodium: 139 mEq/L (ref 135–145)

## 2022-10-06 LAB — VITAMIN D 25 HYDROXY (VIT D DEFICIENCY, FRACTURES): VITD: 88.09 ng/mL (ref 30.00–100.00)

## 2022-10-06 LAB — TSH: TSH: 4.24 u[IU]/mL (ref 0.35–5.50)

## 2022-10-06 NOTE — Assessment & Plan Note (Signed)
Check Vit D level and replete prn.  Repeat DEXA ordered ?

## 2022-10-06 NOTE — Patient Instructions (Signed)
Follow up in 6 months to recheck diabetes, blood pressure, cholesterol We'll notify you of your lab results and make any changes if needed Continue to work on healthy diet and regular exercise- you're doing great! They'll call you to schedule your bone density Call with any questions or concerns Stay Safe!  Stay Healthy! Have a great summer!!!

## 2022-10-06 NOTE — Assessment & Plan Note (Signed)
Pt's PE WNL.  UTD on colonoscopy, mammo, immunizations.  Due for repeat DEXA.  Check labs.  Anticipatory guidance provided.

## 2022-10-06 NOTE — Progress Notes (Signed)
   Subjective:    Patient ID: Heather Gallegos, female    DOB: [birth year], 77 y.o.   MRN: [id number]  HPI CPE- UTD on microalbumin, foot exam, eye exam, colonoscopy, mammo, immunizations.  Due for repeat DEXA  Patient Care Team    Relationship Specialty Notifications Start End  Sheliah Hatch, MD PCP - General Family Medicine  08/29/15   Charna Elizabeth, MD Consulting Physician Gastroenterology  08/25/17   Romualdo Bolk, MD (Inactive) Consulting Physician Obstetrics and Gynecology  08/25/17   [facility] Consulting Physician   09/04/20     Health Maintenance  Topic Date Due   HEMOGLOBIN A1C  07/30/2022   INFLUENZA VACCINE  10/22/2022   FOOT EXAM  10/24/2022   Diabetic kidney evaluation - eGFR measurement  01/30/2023   Diabetic kidney evaluation - Urine ACR  01/30/2023   OPHTHALMOLOGY EXAM  02/27/2023   Colonoscopy  03/01/2023   MAMMOGRAM  04/28/2023   Medicare Annual Wellness (AWV)  08/19/2023   Pneumonia Vaccine 33+ Years old  Completed   DEXA SCAN  Completed   Hepatitis C Screening  Completed   Zoster Vaccines- Shingrix  Completed   HPV VACCINES  Aged Out   DTaP/Tdap/Td  Discontinued   COVID-19 Vaccine  Discontinued      Review of Systems Patient reports no vision/ hearing changes, adenopathy,fever, weight change,  persistant/recurrent hoarseness , swallowing issues, chest pain, palpitations, edema, persistant/recurrent cough, hemoptysis, dyspnea (rest/exertional/paroxysmal nocturnal), gastrointestinal bleeding (melena, rectal bleeding), abdominal pain, significant heartburn, bowel changes, GU symptoms (dysuria, hematuria, incontinence), Gyn symptoms (abnormal  bleeding, pain),  syncope, focal weakness, memory loss, numbness & tingling, skin/hair/nail changes, abnormal bruising or bleeding, anxiety, or depression.     Objective:   Physical Exam General Appearance:    Alert, cooperative, no distress, appears stated age  Head:    Normocephalic, without obvious  abnormality, atraumatic  Eyes:    PERRL, conjunctiva/corneas clear, EOM's intact both eyes  Ears:    Normal TM's and external ear canals, both ears  Nose:   Nares normal, septum midline, mucosa normal, no drainage    or sinus tenderness  Throat:   Lips, mucosa, and tongue normal; teeth and gums normal  Neck:   Supple, symmetrical, trachea midline, no adenopathy;    Thyroid: no enlargement/tenderness/nodules  Back:     Symmetric, no curvature, ROM normal, no CVA tenderness  Lungs:     Clear to auscultation bilaterally, respirations unlabored  Chest Wall:    No tenderness or deformity   Heart:    Regular rate and rhythm, S1 and S2 normal, no murmur, rub   or gallop  Breast Exam:    Deferred to mammo  Abdomen:     Soft, non-tender, bowel sounds active all four quadrants,    no masses, no organomegaly  Genitalia:    Deferred  Rectal:    Extremities:   Extremities normal, atraumatic, no cyanosis or edema  Pulses:   2+ and symmetric all extremities  Skin:   Skin color, texture, turgor normal, no rashes or lesions  Lymph nodes:   Cervical, supraclavicular, and axillary nodes normal  Neurologic:   CNII-XII intact, normal strength, sensation and reflexes    throughout          Assessment & Plan:

## 2022-10-07 ENCOUNTER — Telehealth: Payer: Self-pay

## 2022-10-07 NOTE — Telephone Encounter (Signed)
Pt seen results Via my chart  

## 2022-10-07 NOTE — Telephone Encounter (Signed)
-----   Message from Neena Rhymes sent at 10/06/2022  5:26 PM EDT ----- Labs look great!  No changes at this time

## 2022-11-21 ENCOUNTER — Other Ambulatory Visit: Payer: Self-pay | Admitting: Family Medicine

## 2022-12-18 ENCOUNTER — Other Ambulatory Visit: Payer: Self-pay | Admitting: Family Medicine

## 2022-12-18 DIAGNOSIS — E785 Hyperlipidemia, unspecified: Secondary | ICD-10-CM

## 2022-12-30 ENCOUNTER — Encounter: Payer: Self-pay | Admitting: Family Medicine

## 2022-12-30 ENCOUNTER — Ambulatory Visit: Payer: Medicare PPO | Admitting: Family Medicine

## 2022-12-30 VITALS — BP 138/78 | HR 89 | Temp 98.1°F | Ht 58.5 in | Wt 148.2 lb

## 2022-12-30 DIAGNOSIS — R350 Frequency of micturition: Secondary | ICD-10-CM | POA: Diagnosis not present

## 2022-12-30 LAB — POCT URINALYSIS DIPSTICK
Bilirubin, UA: NEGATIVE
Blood, UA: NEGATIVE
Glucose, UA: NEGATIVE
Ketones, UA: NEGATIVE
Leukocytes, UA: NEGATIVE
Nitrite, UA: NEGATIVE
Protein, UA: NEGATIVE
Spec Grav, UA: 1.01 (ref 1.010–1.025)
Urobilinogen, UA: 0.2 U/dL
pH, UA: 5.5 (ref 5.0–8.0)

## 2022-12-30 MED ORDER — CEPHALEXIN 500 MG PO CAPS: 500.0000 mg | ORAL_CAPSULE | Freq: Two times a day (BID) | ORAL | 0 refills | Status: AC

## 2022-12-30 NOTE — Progress Notes (Unsigned)
   Subjective:    Patient ID: Heather Gallegos, female    DOB: 06-14-1945, 77 y.o.   MRN: [id number]  HPI Urinary frequency- sxs started ~4 days ago w/ frequency, urgency, hesitancy, dysuria.  No blood.  No fevers/chills.  + suprapubic pressure.  No pain in kidneys.  Has to fly out tonight to be w/ family.   Review of Systems For ROS see HPI     Objective:   Physical Exam Vitals reviewed.  Constitutional:      General: She is not in acute distress.    Appearance: Normal appearance. She is well-developed. She is not ill-appearing.  Abdominal:     General: There is no distension.     Palpations: Abdomen is soft.     Tenderness: There is no abdominal tenderness (no suprapubic or CVA tenderness).  Skin:    General: Skin is warm and dry.  Neurological:     General: No focal deficit present.     Mental Status: She is alert and oriented to person, place, and time.  Psychiatric:        Mood and Affect: Mood normal.        Behavior: Behavior normal.        Thought Content: Thought content normal.           Assessment & Plan:  Urine frequency- new.  Pt's sxs are consistent w/ UTI even though UA is unremarkable.  Given that she is traveling out of state, will start empiric abx while awaiting culture results.  Reviewed supportive care and red flags that should prompt return.  Pt expressed understanding and is in agreement w/ plan.

## 2022-12-30 NOTE — Patient Instructions (Signed)
Follow up as needed or as scheduled START the Cephalexin twice daily Drink LOTS of water to flush the bladder We'll notify you of your culture results and make any changes if needed Call with any questions or concerns Stay Safe!  Stay Healthy! SAFE TRAVELS!!!

## 2022-12-31 ENCOUNTER — Encounter: Payer: Self-pay | Admitting: Family Medicine

## 2022-12-31 LAB — URINE CULTURE
MICRO NUMBER:: 15572878
Result:: NO GROWTH
SPECIMEN QUALITY:: ADEQUATE

## 2023-01-01 ENCOUNTER — Telehealth: Payer: Self-pay

## 2023-01-01 NOTE — Telephone Encounter (Signed)
-----   Message from Neena Rhymes sent at 01/01/2023  7:23 AM EDT ----- No evidence of UTI.  Finish the antibiotics as directed.  I hope you are feeling better and have arrived safely!

## 2023-01-01 NOTE — Telephone Encounter (Signed)
Lvm for patient letting her know her results. Asked that she call back with any questions or concerns.

## 2023-03-05 ENCOUNTER — Encounter: Payer: Self-pay | Admitting: Obstetrics and Gynecology

## 2023-03-05 ENCOUNTER — Ambulatory Visit (INDEPENDENT_AMBULATORY_CARE_PROVIDER_SITE_OTHER): Payer: Medicare PPO | Admitting: Obstetrics and Gynecology

## 2023-03-05 VITALS — BP 122/82 | HR 81 | Ht 60.5 in | Wt 149.0 lb

## 2023-03-05 DIAGNOSIS — N950 Postmenopausal bleeding: Secondary | ICD-10-CM

## 2023-03-05 DIAGNOSIS — N814 Uterovaginal prolapse, unspecified: Secondary | ICD-10-CM

## 2023-03-05 NOTE — Progress Notes (Addendum)
77 y.o. y.o. female here for PM bleeding.  Reports started yesterday after her shower she was drying off with a towel and saw it on the towel.  She sat on the toilet and confirmed it was from the vagina.  She used one pad yesterday.  It is lighter today. She had some cramping in her back yesterday. She needs to have her pap smear collected as well.  Patient's last menstrual period was 03/23/1998 (lmp unknown).  Also complains of feeling a bulge from her prolapse and bothersome urinary complaints and would like to see urogyn for this.  DXA scheduled for March Last mammogram: 04/27/22 Last colonoscopy: scheduled Body mass index is 28.62 kg/m.     10/06/2022    9:45 AM 08/19/2022    9:47 AM 06/09/2022    9:35 AM  Depression screen PHQ 2/9  Decreased Interest 0 0 0  Down, Depressed, Hopeless 0 0 0  PHQ - 2 Score 0 0 0  Altered sleeping 0 0 0  Tired, decreased energy 0 0 0  Change in appetite 0 0 0  Feeling bad or failure about yourself  0 0 0  Trouble concentrating 0 0 0  Moving slowly or fidgety/restless 0 0 0  Suicidal thoughts 0 0 0  PHQ-9 Score 0 0 0  Difficult doing work/chores  Not difficult at all Not difficult at all    Blood pressure 122/82, pulse 81, height 5' 0.5" (1.537 m), weight 149 lb (67.6 kg), last menstrual period 03/23/1998, SpO2 97%.     Component Value Date/Time   DIAGPAP  12/15/2017 0000    NEGATIVE FOR INTRAEPITHELIAL LESIONS OR MALIGNANCY.   ADEQPAP  12/15/2017 0000    Satisfactory for evaluation  endocervical/transformation zone component PRESENT.    GYN HISTORY:    Component Value Date/Time   DIAGPAP  12/15/2017 0000    NEGATIVE FOR INTRAEPITHELIAL LESIONS OR MALIGNANCY.   ADEQPAP  12/15/2017 0000    Satisfactory for evaluation  endocervical/transformation zone component PRESENT.    OB History  Gravida Para Term Preterm AB Living  2 2 2  0 0 2  SAB IAB Ectopic Multiple Live Births      2    # Outcome Date GA Lbr Len/2nd Weight Sex Type  Anes PTL Lv  2 Term      Vag-Spont   LIV  1 Term      Vag-Spont   LIV    Past Medical History:  Diagnosis Date   Chronic sinusitis    Clostridium difficile infection 11/2015   Elevated cholesterol    Environmental allergies    Fibroid    GERD (gastroesophageal reflux disease)    Hypercholesteremia    Leg edema, left    Low back pain    Uterine prolapse 2010    Past Surgical History:  Procedure Laterality Date   TONSILLECTOMY AND ADENOIDECTOMY      Current Outpatient Medications on File Prior to Visit  Medication Sig Dispense Refill   b complex vitamins tablet Take 1 tablet by mouth daily.     Biotin 1000 MCG tablet Take 1,000 mcg by mouth daily.     Calcium Carbonate (CALCIUM 600 PO) Take by mouth daily.     Cetirizine HCl (ZYRTEC PO) Take by mouth as needed.     Cholecalciferol (VITAMIN D3) 1000 units CAPS      Coenzyme Q10 (COQ10) 100 MG CAPS      fluticasone (FLONASE) 50 MCG/ACT nasal spray daily as needed.  Glucosamine 500 MG CAPS Take by mouth.     guaiFENesin-codeine (ROBITUSSIN AC) 100-10 MG/5ML syrup Take 10 mLs by mouth 3 (three) times daily as needed for cough. 120 mL 0   Lactobacillus-Inulin (PROBIOTIC DIGESTIVE SUPPORT PO) Take by mouth.     LUTEIN PO Take 20 mg by mouth daily.     metFORMIN (GLUCOPHAGE) 500 MG tablet TAKE 1 TABLET(500 MG) BY MOUTH TWICE DAILY WITH A MEAL 60 tablet 3   omeprazole (PRILOSEC) 20 MG capsule Take 20 mg by mouth daily.     simvastatin (ZOCOR) 40 MG tablet TAKE 1 TABLET BY MOUTH DAILY 90 tablet 1   vitamin C (ASCORBIC ACID) 500 MG tablet Take 500 mg by mouth daily.     vitamin E 400 UNIT capsule Take 400 Units by mouth daily.     No current facility-administered medications on file prior to visit.    Social History   Socioeconomic History   Marital status: Married    Spouse name: Not on file   Number of children: Not on file   Years of education: Not on file   Highest education level: Not on file  Occupational History    Not on file  Tobacco Use   Smoking status: Never   Smokeless tobacco: Never  Vaping Use   Vaping status: Never Used  Substance and Sexual Activity   Alcohol use: No    Alcohol/week: 0.0 standard drinks of alcohol   Drug use: No   Sexual activity: Yes    Partners: Male    Birth control/protection: Post-menopausal  Other Topics Concern   Not on file  Social History Narrative   Not on file   Social Drivers of Health   Financial Resource Strain: Low Risk  (08/19/2022)   Overall Financial Resource Strain (CARDIA)    Difficulty of Paying Living Expenses: Not hard at all  Food Insecurity: No Food Insecurity (08/19/2022)   Hunger Vital Sign    Worried About Running Out of Food in the Last Year: Never true    Ran Out of Food in the Last Year: Never true  Transportation Needs: No Transportation Needs (08/19/2022)   PRAPARE - Administrator, Civil Service (Medical): No    Lack of Transportation (Non-Medical): No  Physical Activity: Sufficiently Active (08/19/2022)   Exercise Vital Sign    Days of Exercise per Week: 4 days    Minutes of Exercise per Session: 40 min  Stress: No Stress Concern Present (08/19/2022)   [employer] of Occupational Health - Occupational Stress Questionnaire    Feeling of Stress : Not at all  Social Connections: Socially Integrated (08/19/2022)   Social Connection and Isolation Panel [NHANES]    Frequency of Communication with Friends and Family: More than three times a week    Frequency of Social Gatherings with Friends and Family: More than three times a week    Attends Religious Services: More than 4 times per year    Active Member of [employer] or Organizations: Yes    Attends Banker Meetings: More than 4 times per year    Marital Status: Married  Catering manager Violence: Not At Risk (08/19/2022)   Humiliation, Afraid, Rape, and Kick questionnaire    Fear of Current or Ex-Partner: No    Emotionally Abused: No    Physically  Abused: No    Sexually Abused: No    Family History  Problem Relation Age of Onset   Cancer Mother 52  Stomach cancer   Hypertension Brother    Diabetes Brother        Type II   Osteoporosis Maternal Aunt    Hypertension Father    Stroke Father      No Known Allergies    Patient's last menstrual period was Patient's last menstrual period was 03/23/1998 (lmp unknown)..             Review of Systems Alls systems reviewed and are negative.         A:         PMB                             P:        Counseled on the importance of the EMB to rule out precancerous or cancerous changes.  The biopsy procedure was reviewed in detail.  Patient to take motrin prior to the procedure. To also complete annual exam and [place]. Referral to urogyn placed 30 minutes spent on reviewing records, imaging,  and one on one patient time and counseling patient and documentation Dr. Karma Greaser  No follow-ups on file.  Earley Favor

## 2023-03-05 NOTE — Addendum Note (Signed)
Addended by: Earley Favor on: 03/05/2023 12:02 PM   Modules accepted: Orders

## 2023-03-08 ENCOUNTER — Other Ambulatory Visit: Payer: Self-pay | Admitting: *Deleted

## 2023-03-08 DIAGNOSIS — N950 Postmenopausal bleeding: Secondary | ICD-10-CM

## 2023-03-09 ENCOUNTER — Encounter: Payer: Self-pay | Admitting: Obstetrics and Gynecology

## 2023-03-09 ENCOUNTER — Ambulatory Visit: Payer: Medicare PPO | Admitting: Obstetrics and Gynecology

## 2023-03-09 ENCOUNTER — Other Ambulatory Visit (HOSPITAL_COMMUNITY)
Admission: RE | Admit: 2023-03-09 | Discharge: 2023-03-09 | Disposition: A | Discharge status: Home / Self Care | Payer: Medicare PPO | Source: Ambulatory Visit | Attending: Obstetrics and Gynecology | Admitting: Obstetrics and Gynecology

## 2023-03-09 VITALS — BP 110/72 | HR 70

## 2023-03-09 DIAGNOSIS — N950 Postmenopausal bleeding: Secondary | ICD-10-CM

## 2023-03-09 DIAGNOSIS — N858 Other specified noninflammatory disorders of uterus: Secondary | ICD-10-CM | POA: Diagnosis not present

## 2023-03-09 DIAGNOSIS — N813 Complete uterovaginal prolapse: Secondary | ICD-10-CM | POA: Diagnosis not present

## 2023-03-09 NOTE — Progress Notes (Signed)
77 y.o. y.o. female here for PM bleeding.  Reports started yesterday after her shower she was drying off with a towel and saw it on the towel.  She sat on the toilet and confirmed it was from the vagina.  She used one pad yesterday.  It is lighter today. She had some cramping in her back yesterday. She needs to have her pap smear collected as well.  Patient's last menstrual period was 03/23/1998 (lmp unknown).  Also complains of feeling a bulge from her prolapse and bothersome urinary complaints and would like to see urogyn for this.  DXA scheduled for March Last mammogram: 04/27/22 Last colonoscopy: scheduled There is no height or weight on file to calculate BMI.     10/06/2022    9:45 AM 08/19/2022    9:47 AM 06/09/2022    9:35 AM  Depression screen PHQ 2/9  Decreased Interest 0 0 0  Down, Depressed, Hopeless 0 0 0  PHQ - 2 Score 0 0 0  Altered sleeping 0 0 0  Tired, decreased energy 0 0 0  Change in appetite 0 0 0  Feeling bad or failure about yourself  0 0 0  Trouble concentrating 0 0 0  Moving slowly or fidgety/restless 0 0 0  Suicidal thoughts 0 0 0  PHQ-9 Score 0 0 0  Difficult doing work/chores  Not difficult at all Not difficult at all    Blood pressure 110/72, pulse 70, last menstrual period 03/23/1998, SpO2 98%.     Component Value Date/Time   DIAGPAP  12/15/2017 0000    NEGATIVE FOR INTRAEPITHELIAL LESIONS OR MALIGNANCY.   ADEQPAP  12/15/2017 0000    Satisfactory for evaluation  endocervical/transformation zone component PRESENT.    GYN HISTORY:    Component Value Date/Time   DIAGPAP  12/15/2017 0000    NEGATIVE FOR INTRAEPITHELIAL LESIONS OR MALIGNANCY.   ADEQPAP  12/15/2017 0000    Satisfactory for evaluation  endocervical/transformation zone component PRESENT.    OB History  Gravida Para Term Preterm AB Living  2 2 2  0 0 2  SAB IAB Ectopic Multiple Live Births      2    # Outcome Date GA Lbr Len/2nd Weight Sex Type Anes PTL Lv  2 Term       Vag-Spont   LIV  1 Term      Vag-Spont   LIV    Past Medical History:  Diagnosis Date   Chronic sinusitis    Clostridium difficile infection 11/2015   Elevated cholesterol    Environmental allergies    Fibroid    GERD (gastroesophageal reflux disease)    Hypercholesteremia    Leg edema, left    Low back pain    Uterine prolapse 2010    Past Surgical History:  Procedure Laterality Date   TONSILLECTOMY AND ADENOIDECTOMY      Current Outpatient Medications on File Prior to Visit  Medication Sig Dispense Refill   b complex vitamins tablet Take 1 tablet by mouth daily.     Biotin 1000 MCG tablet Take 1,000 mcg by mouth daily.     Calcium Carbonate (CALCIUM 600 PO) Take by mouth daily.     Cetirizine HCl (ZYRTEC PO) Take by mouth as needed.     Cholecalciferol (VITAMIN D3) 1000 units CAPS      Coenzyme Q10 (COQ10) 100 MG CAPS      fluticasone (FLONASE) 50 MCG/ACT nasal spray daily as needed.      Glucosamine 500  MG CAPS Take by mouth.     Lactobacillus-Inulin (PROBIOTIC DIGESTIVE SUPPORT PO) Take by mouth.     LUTEIN PO Take 20 mg by mouth daily.     metFORMIN (GLUCOPHAGE) 500 MG tablet TAKE 1 TABLET(500 MG) BY MOUTH TWICE DAILY WITH A MEAL 60 tablet 3   omeprazole (PRILOSEC) 20 MG capsule Take 20 mg by mouth daily.     simvastatin (ZOCOR) 40 MG tablet TAKE 1 TABLET BY MOUTH DAILY 90 tablet 1   vitamin C (ASCORBIC ACID) 500 MG tablet Take 500 mg by mouth daily.     vitamin E 400 UNIT capsule Take 400 Units by mouth daily.     guaiFENesin-codeine (ROBITUSSIN AC) 100-10 MG/5ML syrup Take 10 mLs by mouth 3 (three) times daily as needed for cough. (Patient not taking: Reported on 03/09/2023) 120 mL 0   No current facility-administered medications on file prior to visit.    Social History   Socioeconomic History   Marital status: Married    Spouse name: Not on file   Number of children: Not on file   Years of education: Not on file   Highest education level: Not on file   Occupational History   Not on file  Tobacco Use   Smoking status: Never   Smokeless tobacco: Never  Vaping Use   Vaping status: Never Used  Substance and Sexual Activity   Alcohol use: No    Alcohol/week: 0.0 standard drinks of alcohol   Drug use: No   Sexual activity: Yes    Partners: Male    Birth control/protection: Post-menopausal  Other Topics Concern   Not on file  Social History Narrative   Not on file   Social Drivers of Health   Financial Resource Strain: Low Risk  (08/19/2022)   Overall Financial Resource Strain (CARDIA)    Difficulty of Paying Living Expenses: Not hard at all  Food Insecurity: No Food Insecurity (08/19/2022)   Hunger Vital Sign    Worried About Running Out of Food in the Last Year: Never true    Ran Out of Food in the Last Year: Never true  Transportation Needs: No Transportation Needs (08/19/2022)   PRAPARE - Administrator, Civil Service (Medical): No    Lack of Transportation (Non-Medical): No  Physical Activity: Sufficiently Active (08/19/2022)   Exercise Vital Sign    Days of Exercise per Week: 4 days    Minutes of Exercise per Session: 40 min  Stress: No Stress Concern Present (08/19/2022)   [employer] of Occupational Health - Occupational Stress Questionnaire    Feeling of Stress : Not at all  Social Connections: Socially Integrated (08/19/2022)   Social Connection and Isolation Panel [NHANES]    Frequency of Communication with Friends and Family: More than three times a week    Frequency of Social Gatherings with Friends and Family: More than three times a week    Attends Religious Services: More than 4 times per year    Active Member of [employer] or Organizations: Yes    Attends Banker Meetings: More than 4 times per year    Marital Status: Married  Catering manager Violence: Not At Risk (08/19/2022)   Humiliation, Afraid, Rape, and Kick questionnaire    Fear of Current or Ex-Partner: No    Emotionally  Abused: No    Physically Abused: No    Sexually Abused: No    Family History  Problem Relation Age of Onset   Cancer Mother  53       Stomach cancer   Hypertension Brother    Diabetes Brother        Type II   Osteoporosis Maternal Aunt    Hypertension Father    Stroke Father      No Known Allergies    Patient's last menstrual period was Patient's last menstrual period was 03/23/1998 (lmp unknown)..             Review of Systems Alls systems reviewed and are negative.      PROCEDURE: EMB Consent obtained for the procedure.  A bivalve speculum was placed in the vagina. Patient with complete procidentia noted. The cervix was grasped with a single tooth tenaculum.  Pipelle was inserted and rotated.  Adequate specimen was obtained and sent to pathology.  All instruments were removed.  Patient tolerated the procedure well.  To notify patient of the results.     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  Discussed the RLH with BSO at the same time and I reviewed the procedure in detail.  She will see Dr. Olena Leatherwood or Dr. Tildon Husky with urogyn for consultation.  She will also complete a TV US. EMB completed today and to notify patient of the result.  Dr. Karma Greaser  No follow-ups on file.  Earley Favor

## 2023-03-12 ENCOUNTER — Telehealth (HOSPITAL_BASED_OUTPATIENT_CLINIC_OR_DEPARTMENT_OTHER): Payer: Self-pay | Admitting: Obstetrics and Gynecology

## 2023-03-12 LAB — SURGICAL PATHOLOGY

## 2023-03-14 ENCOUNTER — Other Ambulatory Visit: Payer: Self-pay | Admitting: Family Medicine

## 2023-03-16 ENCOUNTER — Other Ambulatory Visit (HOSPITAL_BASED_OUTPATIENT_CLINIC_OR_DEPARTMENT_OTHER): Payer: Medicare PPO

## 2023-03-16 ENCOUNTER — Ambulatory Visit (HOSPITAL_BASED_OUTPATIENT_CLINIC_OR_DEPARTMENT_OTHER): Payer: Medicare PPO

## 2023-04-06 DIAGNOSIS — H2513 Age-related nuclear cataract, bilateral: Secondary | ICD-10-CM | POA: Diagnosis not present

## 2023-04-06 DIAGNOSIS — H04123 Dry eye syndrome of bilateral lacrimal glands: Secondary | ICD-10-CM | POA: Diagnosis not present

## 2023-04-06 DIAGNOSIS — H02834 Dermatochalasis of left upper eyelid: Secondary | ICD-10-CM | POA: Diagnosis not present

## 2023-04-06 DIAGNOSIS — H35371 Puckering of macula, right eye: Secondary | ICD-10-CM | POA: Diagnosis not present

## 2023-04-06 DIAGNOSIS — H43811 Vitreous degeneration, right eye: Secondary | ICD-10-CM | POA: Diagnosis not present

## 2023-04-06 DIAGNOSIS — H02831 Dermatochalasis of right upper eyelid: Secondary | ICD-10-CM | POA: Diagnosis not present

## 2023-04-06 LAB — HM DIABETES EYE EXAM

## 2023-04-07 ENCOUNTER — Ambulatory Visit (HOSPITAL_BASED_OUTPATIENT_CLINIC_OR_DEPARTMENT_OTHER)
Admission: RE | Admit: 2023-04-07 | Discharge: 2023-04-07 | Disposition: A | Discharge status: Home / Self Care | Payer: Medicare PPO | Source: Ambulatory Visit | Attending: Obstetrics and Gynecology | Admitting: Obstetrics and Gynecology

## 2023-04-07 DIAGNOSIS — N950 Postmenopausal bleeding: Secondary | ICD-10-CM | POA: Diagnosis not present

## 2023-04-07 DIAGNOSIS — N814 Uterovaginal prolapse, unspecified: Secondary | ICD-10-CM | POA: Diagnosis not present

## 2023-04-07 DIAGNOSIS — N888 Other specified noninflammatory disorders of cervix uteri: Secondary | ICD-10-CM | POA: Diagnosis not present

## 2023-04-08 ENCOUNTER — Ambulatory Visit: Payer: Medicare PPO | Admitting: Family Medicine

## 2023-04-08 ENCOUNTER — Encounter: Payer: Self-pay | Admitting: Family Medicine

## 2023-04-08 ENCOUNTER — Encounter: Payer: Self-pay | Admitting: Obstetrics and Gynecology

## 2023-04-08 ENCOUNTER — Other Ambulatory Visit: Payer: Self-pay

## 2023-04-08 VITALS — BP 132/62 | HR 71 | Temp 97.7°F | Ht 60.5 in | Wt 147.1 lb

## 2023-04-08 DIAGNOSIS — E785 Hyperlipidemia, unspecified: Secondary | ICD-10-CM | POA: Diagnosis not present

## 2023-04-08 DIAGNOSIS — E119 Type 2 diabetes mellitus without complications: Secondary | ICD-10-CM | POA: Diagnosis not present

## 2023-04-08 DIAGNOSIS — Z7984 Long term (current) use of oral hypoglycemic drugs: Secondary | ICD-10-CM

## 2023-04-08 DIAGNOSIS — E663 Overweight: Secondary | ICD-10-CM

## 2023-04-08 DIAGNOSIS — N949 Unspecified condition associated with female genital organs and menstrual cycle: Secondary | ICD-10-CM

## 2023-04-08 LAB — CBC WITH DIFFERENTIAL/PLATELET
Basophils Absolute: 0 10*3/uL (ref 0.0–0.1)
Basophils Relative: 0.6 % (ref 0.0–3.0)
Eosinophils Absolute: 0.2 10*3/uL (ref 0.0–0.7)
Eosinophils Relative: 3.6 % (ref 0.0–5.0)
HCT: 39.5 % (ref 36.0–46.0)
Hemoglobin: 13.4 g/dL (ref 12.0–15.0)
Lymphocytes Relative: 28 % (ref 12.0–46.0)
Lymphs Abs: 1.6 10*3/uL (ref 0.7–4.0)
MCHC: 33.9 g/dL (ref 30.0–36.0)
MCV: 89.9 fL (ref 78.0–100.0)
Monocytes Absolute: 0.5 10*3/uL (ref 0.1–1.0)
Monocytes Relative: 9.3 % (ref 3.0–12.0)
Neutro Abs: 3.4 10*3/uL (ref 1.4–7.7)
Neutrophils Relative %: 58.5 % (ref 43.0–77.0)
Platelets: 296 10*3/uL (ref 150.0–400.0)
RBC: 4.39 Mil/uL (ref 3.87–5.11)
RDW: 13.7 % (ref 11.5–15.5)
WBC: 5.8 10*3/uL (ref 4.0–10.5)

## 2023-04-08 LAB — BASIC METABOLIC PANEL
BUN: 21 mg/dL (ref 6–23)
CO2: 29 meq/L (ref 19–32)
Calcium: 9.2 mg/dL (ref 8.4–10.5)
Chloride: 101 meq/L (ref 96–112)
Creatinine, Ser: 0.88 mg/dL (ref 0.40–1.20)
GFR: 63.27 mL/min (ref >60.00)
Glucose, Bld: 115 mg/dL — ABNORMAL HIGH (ref 70–99)
Potassium: 4.1 meq/L (ref 3.5–5.1)
Sodium: 138 meq/L (ref 135–145)

## 2023-04-08 LAB — MICROALBUMIN / CREATININE URINE RATIO
Creatinine,U: 103.7 mg/dL
Microalb Creat Ratio: 1.9 mg/g (ref 0.0–30.0)
Microalb, Ur: 2 mg/dL — ABNORMAL HIGH (ref 0.0–1.9)

## 2023-04-08 LAB — HEPATIC FUNCTION PANEL
ALT: 20 U/L (ref 0–35)
AST: 20 U/L (ref 0–37)
Albumin: 4.2 g/dL (ref 3.5–5.2)
Alkaline Phosphatase: 49 U/L (ref 39–117)
Bilirubin, Direct: 0.1 mg/dL (ref 0.0–0.3)
Total Bilirubin: 0.4 mg/dL (ref 0.2–1.2)
Total Protein: 6.7 g/dL (ref 6.0–8.3)

## 2023-04-08 LAB — LIPID PANEL
Cholesterol: 166 mg/dL (ref 0–200)
HDL: 48.5 mg/dL (ref >39.00)
LDL Cholesterol: 85 mg/dL (ref 0–99)
NonHDL: 117.45
Total CHOL/HDL Ratio: 3
Triglycerides: 164 mg/dL — ABNORMAL HIGH (ref 0.0–149.0)
VLDL: 32.8 mg/dL (ref 0.0–40.0)

## 2023-04-08 LAB — TSH: TSH: 3.33 u[IU]/mL (ref 0.35–5.50)

## 2023-04-08 LAB — HEMOGLOBIN A1C: Hgb A1c MFr Bld: 6.8 % — ABNORMAL HIGH (ref 4.6–6.5)

## 2023-04-08 NOTE — Progress Notes (Signed)
   Subjective:    Patient ID: Heather Gallegos, female    DOB: 1945/04/03, 78 y.o.   MRN: [id number]  HPI DM- chronic problem, on Metformin 500mg  BID.  UTD on eye exam, foot exam.  Due for microalbumin.  No CP, SOB, HA's, visual changes.  Denies symptomatic lows other than on rare occasions.  No numbness/tingling of hands/feet.  Hyperlipidemia- chronic problem, on Simvastatin 40mg  daily.  No abd pain, N/V.  Overweight- weight is stable since CPE in July.  BMI 28.26  Physically active but no formal exercise   Review of Systems For ROS see HPI     Objective:   Physical Exam Vitals reviewed.  Constitutional:      General: She is not in acute distress.    Appearance: Normal appearance. She is well-developed. She is not ill-appearing.  HENT:     Head: Normocephalic and atraumatic.  Eyes:     Conjunctiva/sclera: Conjunctivae normal.     Pupils: Pupils are equal, round, and reactive to light.  Neck:     Thyroid: No thyromegaly.  Cardiovascular:     Rate and Rhythm: Normal rate and regular rhythm.     Pulses: Normal pulses.     Heart sounds: Normal heart sounds. No murmur heard. Pulmonary:     Effort: Pulmonary effort is normal. No respiratory distress.     Breath sounds: Normal breath sounds.  Abdominal:     General: There is no distension.     Palpations: Abdomen is soft.     Tenderness: There is no abdominal tenderness.  Musculoskeletal:     Cervical back: Normal range of motion and neck supple.     Right lower leg: No edema.     Left lower leg: No edema.  Lymphadenopathy:     Cervical: No cervical adenopathy.  Skin:    General: Skin is warm and dry.  Neurological:     General: No focal deficit present.     Mental Status: She is alert and oriented to person, place, and time.  Psychiatric:        Mood and Affect: Mood normal.        Behavior: Behavior normal.           Assessment & Plan:

## 2023-04-08 NOTE — Patient Instructions (Signed)
Schedule your complete physical in 6 months We'll notify you of your lab results and make any changes if needed Keep up the good work on healthy diet and regular exercise- you're doing great!!! Call with any questions or concerns Stay Safe!  Stay Healthy! Happy New Year!! 

## 2023-04-08 NOTE — Assessment & Plan Note (Signed)
Chronic problem.  On Metformin 500mg  BID w/o difficulty.  UTD on eye exam, foot exam.  Microalbumin ordered.  Currently asymptomatic.  Check labs.  Adjust meds prn

## 2023-04-08 NOTE — Assessment & Plan Note (Signed)
Weight is stable.  She is very active but does not participate in formal exercise.  Encouraged her to do so when able.  Will continue to follow.

## 2023-04-08 NOTE — Assessment & Plan Note (Signed)
Chronic problem, on Simvastatin 40mg daily w/o difficulty.  Check labs.  Adjust meds prn  

## 2023-04-09 ENCOUNTER — Telehealth: Payer: Self-pay

## 2023-04-09 NOTE — Telephone Encounter (Signed)
Pt has reviewed labs via MyChart

## 2023-04-09 NOTE — Telephone Encounter (Signed)
-----   Message from Neena Rhymes sent at 04/09/2023  7:47 AM EST ----- Labs look great!  No changes at this time

## 2023-04-14 ENCOUNTER — Other Ambulatory Visit: Payer: Medicare PPO

## 2023-04-19 ENCOUNTER — Other Ambulatory Visit: Payer: Medicare PPO

## 2023-04-19 DIAGNOSIS — N950 Postmenopausal bleeding: Secondary | ICD-10-CM | POA: Diagnosis not present

## 2023-04-19 DIAGNOSIS — N949 Unspecified condition associated with female genital organs and menstrual cycle: Secondary | ICD-10-CM

## 2023-04-19 DIAGNOSIS — N813 Complete uterovaginal prolapse: Secondary | ICD-10-CM | POA: Diagnosis not present

## 2023-04-19 DIAGNOSIS — N83201 Unspecified ovarian cyst, right side: Secondary | ICD-10-CM | POA: Diagnosis not present

## 2023-04-20 LAB — CA 125: CA 125: 8 U/mL (ref <35)

## 2023-04-21 ENCOUNTER — Encounter: Payer: Self-pay | Admitting: Obstetrics and Gynecology

## 2023-04-22 ENCOUNTER — Other Ambulatory Visit: Payer: Medicare PPO | Admitting: Obstetrics and Gynecology

## 2023-04-22 ENCOUNTER — Other Ambulatory Visit: Payer: Medicare PPO

## 2023-05-31 ENCOUNTER — Ambulatory Visit: Payer: Medicare PPO | Admitting: Obstetrics and Gynecology

## 2023-06-11 ENCOUNTER — Encounter: Payer: Self-pay | Admitting: Family Medicine

## 2023-06-11 ENCOUNTER — Other Ambulatory Visit: Payer: Self-pay | Admitting: Family Medicine

## 2023-06-11 ENCOUNTER — Ambulatory Visit
Admission: RE | Admit: 2023-06-11 | Discharge: 2023-06-11 | Disposition: A | Discharge status: Home / Self Care | Payer: Medicare PPO | Source: Ambulatory Visit | Attending: Family Medicine | Admitting: Family Medicine

## 2023-06-11 DIAGNOSIS — M8580 Other specified disorders of bone density and structure, unspecified site: Secondary | ICD-10-CM

## 2023-06-11 DIAGNOSIS — N958 Other specified menopausal and perimenopausal disorders: Secondary | ICD-10-CM | POA: Diagnosis not present

## 2023-06-11 DIAGNOSIS — Z1231 Encounter for screening mammogram for malignant neoplasm of breast: Secondary | ICD-10-CM

## 2023-06-11 DIAGNOSIS — M8588 Other specified disorders of bone density and structure, other site: Secondary | ICD-10-CM | POA: Diagnosis not present

## 2023-06-11 DIAGNOSIS — E2839 Other primary ovarian failure: Secondary | ICD-10-CM | POA: Diagnosis not present

## 2023-06-11 NOTE — Telephone Encounter (Signed)
-----   Message from Neena Rhymes sent at 06/11/2023 10:52 AM EDT ----- Your bone density shows thinning bone (osteopenia- but not osteoporosis)  Make sure you are taking daily Calcium and Vit D

## 2023-06-11 NOTE — Telephone Encounter (Signed)
 Lab results have been discussed.   Verbalized understanding? Yes  Are there any questions? No

## 2023-06-12 ENCOUNTER — Other Ambulatory Visit: Payer: Self-pay | Admitting: Family Medicine

## 2023-06-12 DIAGNOSIS — E785 Hyperlipidemia, unspecified: Secondary | ICD-10-CM

## 2023-06-16 ENCOUNTER — Ambulatory Visit
Admission: RE | Admit: 2023-06-16 | Discharge: 2023-06-16 | Disposition: A | Discharge status: Home / Self Care | Source: Ambulatory Visit | Attending: Family Medicine | Admitting: Family Medicine

## 2023-06-16 DIAGNOSIS — Z1231 Encounter for screening mammogram for malignant neoplasm of breast: Secondary | ICD-10-CM

## 2023-06-17 ENCOUNTER — Ambulatory Visit: Payer: Self-pay

## 2023-06-17 ENCOUNTER — Ambulatory Visit (INDEPENDENT_AMBULATORY_CARE_PROVIDER_SITE_OTHER): Admitting: Student in an Organized Health Care Education/Training Program

## 2023-06-17 ENCOUNTER — Ambulatory Visit (INDEPENDENT_AMBULATORY_CARE_PROVIDER_SITE_OTHER): Payer: Medicare PPO | Admitting: Obstetrics and Gynecology

## 2023-06-17 ENCOUNTER — Encounter: Payer: Self-pay | Admitting: Obstetrics and Gynecology

## 2023-06-17 ENCOUNTER — Encounter: Payer: Self-pay | Admitting: Student in an Organized Health Care Education/Training Program

## 2023-06-17 VITALS — BP 160/80 | HR 83 | Ht <= 58 in | Wt 147.0 lb

## 2023-06-17 VITALS — BP 140/88 | HR 78 | Temp 98.0°F | Wt 148.0 lb

## 2023-06-17 DIAGNOSIS — N3946 Mixed incontinence: Secondary | ICD-10-CM | POA: Diagnosis not present

## 2023-06-17 DIAGNOSIS — H1032 Unspecified acute conjunctivitis, left eye: Secondary | ICD-10-CM | POA: Diagnosis not present

## 2023-06-17 DIAGNOSIS — N3941 Urge incontinence: Secondary | ICD-10-CM | POA: Insufficient documentation

## 2023-06-17 DIAGNOSIS — N812 Incomplete uterovaginal prolapse: Secondary | ICD-10-CM | POA: Diagnosis not present

## 2023-06-17 DIAGNOSIS — H1132 Conjunctival hemorrhage, left eye: Secondary | ICD-10-CM | POA: Diagnosis not present

## 2023-06-17 DIAGNOSIS — R339 Retention of urine, unspecified: Secondary | ICD-10-CM | POA: Diagnosis not present

## 2023-06-17 DIAGNOSIS — R350 Frequency of micturition: Secondary | ICD-10-CM

## 2023-06-17 DIAGNOSIS — N393 Stress incontinence (female) (male): Secondary | ICD-10-CM

## 2023-06-17 LAB — POCT URINALYSIS DIPSTICK
Bilirubin, UA: NEGATIVE
Blood, UA: NEGATIVE
Glucose, UA: NEGATIVE
Ketones, UA: NEGATIVE
Leukocytes, UA: NEGATIVE
Nitrite, UA: NEGATIVE
Protein, UA: NEGATIVE
Spec Grav, UA: 1.015 (ref 1.010–1.025)
Urobilinogen, UA: 0.2 U/dL
pH, UA: 5.5 (ref 5.0–8.0)

## 2023-06-17 MED ORDER — MOXIFLOXACIN HCL 0.5 % OP SOLN
1.0000 [drp] | Freq: Three times a day (TID) | OPHTHALMIC | 0 refills | Status: AC
Start: 2023-06-17 — End: ?

## 2023-06-17 NOTE — Progress Notes (Signed)
 New Patient Evaluation and Consultation  Referring Provider: Earley Favor, MD PCP: Sheliah Hatch, MD Date of Service: 06/17/2023  SUBJECTIVE Chief Complaint: New Patient (Initial Visit) Renegar Panchal is a 78 y.o. female here for a post menopausal bleeding and prolapse.)  History of Present Illness: Shiya Fogelman is a 78 y.o. White or Caucasian female seen in consultation at the request of Dr Karma Greaser for evaluation of prolapse.    Review of records significant for: ENDOMETRIUM, BIOPSY:  -  Poorly preserved strips of atrophic-appearing endometrium in the  background of predominantly mucus, negative for atypia/hyperplasia on  sections examined   Urinary Symptoms: Leaks urine with cough/ sneeze, with a full bladder, and with urgency Leaks 1-2  time(s) per week (usually if the bladder gets too full) Pad use: 1 liners/ mini-pads per day.   Patient is bothered by UI symptoms.  Day time voids 6.  Nocturia: 1 times per night to void. Voiding dysfunction:  does not empty bladder well.  Patient does not use a catheter to empty bladder.  When urinating, patient feels dribbling after finishing Drinks: water, coffee, diet coke per day  UTIs: 2 UTI's in the last year.   Denies history of blood in urine and kidney or bladder stones  Pelvic Organ Prolapse Symptoms:                  Patient Admits to a feeling of a bulge the vaginal area. It has been present for several years.  Patient Admits to seeing a bulge.  This bulge is bothersome. She tried a pessary previously in the office several years ago but could not get it to stay in and was uncomfortable  Bowel Symptom: Bowel movements: 2 time(s) per day Stool consistency: hard Straining: no.  Splinting: no.  Incomplete evacuation: yes.  Patient Denies accidental bowel leakage / fecal incontinence Bowel regimen: diet and fiber  HM Colonoscopy          Current Care Gaps     Colonoscopy (Every 5 Years) Overdue since  03/01/2023    02/28/2018  HM Colonoscopy component of [facility] COLONOSCOPY   Only the first 1 history entries have been loaded, but more history exists.                Sexual Function Sexually active: yes.  Sexual orientation:  heterosexual Pain with sex: No  Pelvic Pain Denies pelvic pain  Past Medical History:  Past Medical History:  Diagnosis Date   Chronic sinusitis    Clostridium difficile infection 11/2015   Diabetes mellitus without complication (HCC)    Elevated cholesterol    Environmental allergies    Fibroid    GERD (gastroesophageal reflux disease)    Hypercholesteremia    Leg edema, left    Low back pain    Uterine prolapse 2010     Past Surgical History:   Past Surgical History:  Procedure Laterality Date   BREAST CYST ASPIRATION Left 2015   TONSILLECTOMY AND ADENOIDECTOMY       Past OB/GYN History: OB History  Gravida Para Term Preterm AB Living  2 2 2  0 0 2  SAB IAB Ectopic Multiple Live Births      2    # Outcome Date GA Lbr Len/2nd Weight Sex Type Anes PTL Lv  2 Term      Vag-Spont   LIV  1 Term      Vag-Spont   LIV    Menopausal: Yes, at age 78, Admits to  vaginal bleeding since menopause  ENDOMETRIUM, BIOPSY (04/08/23):  -  Poorly preserved strips of atrophic-appearing endometrium in the  background of predominantly mucus, negative for atypia/hyperplasia on  sections examined   Narrative & Impression  : PROCEDURE: US PELVIS COMPLETE WITH TRANSVAGINAL (04/07/23)   HISTORY: Patient is a 78 y/o F with postmenopausal bleeding x 2 since 03/08/2023. Uterine prolapse.   COMPARISON: None.   TECHNIQUE: Two-dimensional transabdominal grayscale and color Doppler ultrasound of the pelvis was performed. Transvaginal was performed.   FINDINGS: The uterus is retroverted in position and measures 7.2 x 3.5 x 3.9 cm. It demonstrates a heterogeneous echotexture without definite fibroid. The endometrium measures 0.2 cm and demonstrates a  normal homogeneous echotexture. Nabothian cysts are visualized within the cervix.   The bilateral ovaries are not identified. There is 3.2 cm anechoic cyst visualized within the right adnexa.   There is no fluid present within the cul-de-sac.   IMPRESSION: 1. Heterogeneous uterine echotexture.  No definite fibroid.   2. Nonvisualization of the bilateral ovaries. Right adnexal 3.2 cm simple cyst. This may be ovarian in etiology. Multiphase MR of the pelvis is recommended for further evaluation.         Component Value Date/Time   DIAGPAP  12/15/2017 0000    NEGATIVE FOR INTRAEPITHELIAL LESIONS OR MALIGNANCY.   ADEQPAP  12/15/2017 0000    Satisfactory for evaluation  endocervical/transformation zone component PRESENT.    Medications: Patient has a current medication list which includes the following prescription(s): b complex vitamins, biotin, calcium carbonate, cetirizine hcl, vitamin d3, coq10, lactobacillus-inulin, lutein, metformin, omeprazole, simvastatin, ascorbic acid, vitamin e, fluticasone, and glucosamine.   Allergies: Patient has no known allergies.   Social History:  Social History   Tobacco Use   Smoking status: Never   Smokeless tobacco: Never  Vaping Use   Vaping status: Never Used  Substance Use Topics   Alcohol use: No    Alcohol/week: 0.0 standard drinks of alcohol   Drug use: No    Relationship status: married Patient lives with her husband.   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 Regular exercise: No History of abuse: No  Family History:   Family History  Problem Relation Age of Onset   Cancer Mother 79       Stomach cancer   Hypertension Brother    Diabetes Brother        Type II   Osteoporosis Maternal Aunt    Hypertension Father    Stroke Father      Review of Systems: Review of Systems  Constitutional:  Negative for fever, malaise/fatigue and weight loss.  Respiratory:  Negative for cough, shortness of breath and  wheezing.   Cardiovascular:  Negative for chest pain, palpitations and leg swelling.  Gastrointestinal:  Negative for abdominal pain and blood in stool.  Genitourinary:  Negative for dysuria.  Musculoskeletal:  Negative for myalgias.  Skin:  Negative for rash.  Neurological:  Negative for dizziness and headaches.  Endo/Heme/Allergies:  Does not bruise/bleed easily.  Psychiatric/Behavioral:  Negative for depression. The patient is not nervous/anxious.      OBJECTIVE Physical Exam: Vitals:   06/17/23 0848 06/17/23 0849 06/17/23 0946  BP: (!) 165/89 (!) 166/80 (!) 160/80  Pulse: 82 83   Weight: 147 lb (66.7 kg)    Height: 4\' 10"  (1.473 m)      Physical Exam Vitals reviewed. Exam conducted with a chaperone present.  Constitutional:      General: She is not in acute  distress. Pulmonary:     Effort: Pulmonary effort is normal.  Abdominal:     General: There is no distension.     Palpations: Abdomen is soft.     Tenderness: There is no abdominal tenderness. There is no rebound.  Musculoskeletal:        General: No swelling. Normal range of motion.  Skin:    General: Skin is warm and dry.     Findings: No rash.  Neurological:     Mental Status: She is alert and oriented to person, place, and time.  Psychiatric:        Mood and Affect: Mood normal.        Behavior: Behavior normal.      GU / Detailed Urogynecologic Evaluation:  Pelvic Exam: Normal external female genitalia; Bartholin's and Skene's glands normal in appearance; urethral meatus normal in appearance, no urethral masses or discharge.   CST: negative  Speculum exam reveals normal vaginal mucosa with atrophy. Cervix normal appearance. Uterus normal single, nontender. Adnexa no mass, fullness, tenderness.       Pelvic floor strength II/V, puborectalis IV/V external anal sphincter III/V  Pelvic floor musculature: Right levator non-tender, Right obturator non-tender, Left levator non-tender, Left obturator  non-tender  POP-Q:   POP-Q  -1                                            Aa   -1                                           Ba  2                                              C   4                                            Gh  5                                            Pb  6                                            tvl   -1.5                                            Ap  -1.5                                            Bp  -3.5  D      Rectal Exam:  Normal sphincter tone, small distal rectocele, enterocoele not present, no rectal masses, no sign of dyssynergia when asking the patient to bear down.  Post-Void Residual (PVR) by Bladder Scan: In order to evaluate bladder emptying, we discussed obtaining a postvoid residual and patient agreed to this procedure.  Procedure: The ultrasound unit was placed on the patient's abdomen in the suprapubic region after the patient had voided.    Post Void Residual - 06/17/23 0931       Post Void Residual   Post Void Residual 225 mL              Laboratory Results: Lab Results  Component Value Date   COLORU Yellow 06/17/2023   CLARITYU Clear 06/17/2023   GLUCOSEUR Negative 06/17/2023   BILIRUBINUR Negative 06/17/2023   KETONESU Negative 06/17/2023   SPECGRAV 1.015 06/17/2023   RBCUR Negative 06/17/2023   PHUR 5.5 06/17/2023   PROTEINUR Negative 06/17/2023   UROBILINOGEN 0.2 06/17/2023   LEUKOCYTESUR Negative 06/17/2023    Lab Results  Component Value Date   CREATININE 0.88 04/08/2023   CREATININE 0.92 10/06/2022   CREATININE 0.93 01/29/2022    Lab Results  Component Value Date   HGBA1C 6.8 (H) 04/08/2023    Lab Results  Component Value Date   HGB 13.4 04/08/2023     ASSESSMENT AND PLAN Ms. Jablon is a 78 y.o. with:  1. Uterovaginal prolapse, incomplete   2. Incomplete bladder emptying   3. Urinary frequency   4. Urge incontinence   5. SUI  (stress urinary incontinence, female)     Uterovaginal prolapse, incomplete Assessment & Plan: Stage II anterior, Stage II posterior, Stage III apical prolapse - For treatment of pelvic organ prolapse, we discussed options for management including expectant management, conservative management, and surgical management, such as Kegels, a pessary, pelvic floor physical therapy, and specific surgical procedures. - She is interested in surgery. We discussed two options for prolapse repair:  1) vaginal repair without mesh - Pros - safer, no mesh complications - Cons - not as strong as mesh repair, higher risk of recurrence  2) laparoscopic repair with mesh - Pros - stronger, better long-term success - Cons - risks of mesh implant (erosion into vagina or bladder, adhering to the rectum, pain) - these risks are lower than with a vaginal mesh but still exist - She would prefer vaginal prolapse repair without mesh. Will plan for: TVH, BSO, USLS, anterior and posterior repair with perineorrhaphy, cysto   Incomplete bladder emptying Assessment & Plan: - Will evaluate further with urodynamic testing.    Urinary frequency -     POCT urinalysis dipstick  Urge incontinence Assessment & Plan: - Not that bothersome at this time, infrequent.  - We discussed reducing bladder irritants such as coffee or soda can reduce symptoms.    SUI (stress urinary incontinence, female) Assessment & Plan: For treatment of stress urinary incontinence,  non-surgical options include expectant management, weight loss, physical therapy, as well as a pessary.  Surgical options include a midurethral sling, Burch urethropexy, and transurethral injection of a bulking agent. - Will evaluate further with urodynamic testing. Since she has incomplete emptying, may require a staged procedure depending on study results.    Return for urodynamics   Marguerita Beards, MD

## 2023-06-17 NOTE — Assessment & Plan Note (Signed)
 For treatment of stress urinary incontinence,  non-surgical options include expectant management, weight loss, physical therapy, as well as a pessary.  Surgical options include a midurethral sling, Burch urethropexy, and transurethral injection of a bulking agent. - Will evaluate further with urodynamic testing. Since she has incomplete emptying, may require a staged procedure depending on study results.

## 2023-06-17 NOTE — Patient Instructions (Signed)

## 2023-06-17 NOTE — Patient Instructions (Signed)
 Bacterial Conjunctivitis, Adult Bacterial conjunctivitis is an infection of the clear membrane that covers the white part of the eye and the inner surface of the eyelid (conjunctiva). When the blood vessels in the conjunctiva become inflamed, the eye becomes red or pink. The eye often feels irritated or itchy. Bacterial conjunctivitis spreads easily from person to person (is contagious). It also spreads easily from one eye to the other eye. What are the causes? This condition is caused by bacteria. You may get the infection if you come into close contact with: A person who is infected with the bacteria. Items that are contaminated with the bacteria, such as a face towel, contact lens solution, or eye makeup. What increases the risk? You are more likely to develop this condition if: You are exposed to other people who have the infection. You wear contact lenses. You have a sinus infection. You have had a recent eye injury or surgery. You have a weak body defense system (immune system). You have a medical condition that causes dry eyes. What are the signs or symptoms? Symptoms of this condition include: Thick, yellowish discharge from the eye. This may turn into a crust on the eyelid overnight and cause your eyelids to stick together. Tearing or watery eyes. Itchy eyes. Burning feeling in your eyes. Eye redness. Swollen eyelids. Blurred vision. How is this diagnosed? This condition is diagnosed based on your symptoms and medical history. Your health care provider may also take a sample of discharge from your eye to find the cause of your infection. How is this treated? This condition may be treated with: Antibiotic eye drops or ointment to clear the infection more quickly and prevent the spread of infection to others. Antibiotic medicines taken by mouth (orally) to treat infections that do not respond to drops or ointments or that last longer than 10 days. Cool, wet cloths (cool  compresses) placed on the eyes. Artificial tears applied 2-6 times a day. Follow these instructions at home: Medicines Take or apply your antibiotic medicine as told by your health care provider. Do not stop using the antibiotic, even if your condition improves, unless directed by your health care provider. Take or apply over-the-counter and prescription medicines only as told by your health care provider. Be very careful to avoid touching the edge of your eyelid with the eye-drop bottle or the ointment tube when you apply medicines to the affected eye. This will keep you from spreading the infection to your other eye or to other people. Managing discomfort Gently wipe away any drainage from your eye with a warm, wet washcloth or a cotton ball. Apply a clean, cool compress to your eye for 10-20 minutes, 3-4 times a day. General instructions Do not wear contact lenses until the inflammation is gone and your health care provider says it is safe to wear them again. Ask your health care provider how to sterilize or replace your contact lenses before you use them again. Wear glasses until you can resume wearing contact lenses. Avoid wearing eye makeup until the inflammation is gone. Throw away any old eye cosmetics that may be contaminated. Change or wash your pillowcase every day. Do not share towels or washcloths. This may spread the infection. Wash your hands often with soap and water for at least 20 seconds and especially before touching your face or eyes. Use paper towels to dry your hands. Avoid touching or rubbing your eyes. Do not drive or use heavy machinery if your vision is blurred. Contact  a health care provider if: You have a fever. Your symptoms do not get better after 10 days. Get help right away if: You have a fever and your symptoms suddenly get worse. You have severe pain when you move your eye. You have facial pain, redness, or swelling. You have a sudden loss of  vision. Summary Bacterial conjunctivitis is an infection of the clear membrane that covers the white part of the eye and the inner surface of the eyelid (conjunctiva). Bacterial conjunctivitis spreads easily from eye to eye and from person to person (is contagious). Wash your hands often with soap and water for at least 20 seconds and especially before touching your face or eyes. Use paper towels to dry your hands. Take or apply your antibiotic medicine as told by your health care provider. Do not stop using the antibiotic even if your condition improves. Contact a health care provider if you have a fever or if your symptoms do not get better after 10 days. Get help right away if you have a sudden loss of vision. This information is not intended to replace advice given to you by your health care provider. Make sure you discuss any questions you have with your health care provider. Document Revised: 06/19/2020 Document Reviewed: 06/19/2020 Elsevier Patient Education  2024 [employer].

## 2023-06-17 NOTE — Progress Notes (Signed)
   Acute Office Visit  Subjective:     Patient ID: Heather Gallegos, female    DOB: 11-06-1945, 78 y.o.   MRN: [id number]  Chief Complaint  Patient presents with   Eye Problem    Left eye pain on the side that started yesterday and seemed to have gotten worse. Redness, swelling,drainage.     HPI  Patient is in today for left eye discharge and redness.  This started yesterday.  She has had slight discomfort at the eyelid with some swelling but no ocular pain.  Denies any changes in her vision.  No recent illness but she has had some sinus congestion.  No sick contacts, but is around her grandchildren often.  She woke up this morning with thick matting and crusting of the left eye.  Last night she noticed that the discharge was white and thick.  Fevers or chills.  No systemic symptoms.  The patient is established with Dr. Dione Booze for surveillance of diabetic eye disease.  She has had no eye surgeries.  Has had no retinal issues in the past.  She has mild cataracts, never had issues with glaucoma, her last evaluation was in January and she reports it was reassuring.      Objective:    BP (!) 140/88   Pulse 78   Temp 98 F (36.7 C) (Temporal)   Wt 148 lb (67.1 kg)   LMP 03/23/1998 (LMP Unknown)   SpO2 96%   BMI 30.93 kg/m    Physical Exam  General: Well-appearing woman Eyes: Left eye as mild swelling at the lateral eyelid, mild ptosis, there is a moderate subconjunctival hemorrhage laterally, moderate conjunctivitis can be seen immediately, scant thin discharge on my exam.  Normal extraocular motion without discomfort.  Normal ocular pressures bilaterally to light touch.  Normal vision in the left eye and right eye.  Mild cataract in the lens.  Normal pupil, reactive to light.      Assessment & Plan:   Problem List Items Addressed This Visit       Unprioritized   Acute bacterial conjunctivitis of left eye - Primary   Exam and history are consistent with acute bacterial  conjunctivitis in the left eye.  This is also complicated by a subconjunctival hemorrhage probably related to surface inflammation.  No issues with vision, no pain, is having some mild swelling of the eyelid.  Will treat with moxifloxacin drops 3 times daily for 7 days.  Gave her return precautions if she has any increase in pain, changes in vision, or systemic symptoms.      Relevant Medications   moxifloxacin (VIGAMOX) 0.5 % ophthalmic solution   Subconjunctival hematoma, left   Moderate lateral subconjunctival hemorrhage on exam.  This is most likely related to an acute bacterial conjunctivitis causing surface inflammation and predisposing to trauma.  Also had some recent coughing from sinus congestion that may have contributed.  No issues with vision.  Supportive care, I warned that these can take weeks to resolve.       Meds ordered this encounter  Medications   moxifloxacin (VIGAMOX) 0.5 % ophthalmic solution    Sig: Place 1 drop into the left eye 3 (three) times daily.    Dispense:  3 mL    Refill:  0    No follow-ups on file.  Tyson Alias, MD

## 2023-06-17 NOTE — Assessment & Plan Note (Signed)
-   Will evaluate further with urodynamic testing.

## 2023-06-17 NOTE — Telephone Encounter (Signed)
 Copied from CRM [id number]. Topic: Clinical - Red Word Triage >> Jun 17, 2023  7:52 AM Heather Gallegos wrote: Red Word that prompted transfer to Nurse Triage: Patient stated she may have an eye infection, swollen and closed shut this morning.   Chief Complaint: Left eye swelling, drainage Symptoms: Above Frequency: Yesterday Pertinent Negatives: Patient denies fever Disposition: [] ED /[] Urgent Care (no appt availability in office) / [x] Appointment(In office/virtual)/ []  [facility]/ [] Home Care/ [] Refused Recommended Disposition /[] [facility]/ []  Follow-up with PCP Additional Notes: Agrees with appointment.  Reason for Disposition  Eyelid is red and painful (or tender to touch)  Answer Assessment - Initial Assessment Questions 1. EYE DISCHARGE: "Is the discharge in one or both eyes?" "What color is it?" "How much is there?" "When did the discharge start?"      Yes 2. REDNESS OF SCLERA: "Is the redness in one or both eyes?" "When did the redness start?"      Yes 3. EYELIDS: "Are the eyelids red or swollen?" If Yes, ask: "How much?"      Yes 4. VISION: "Is there any difficulty seeing clearly?"      Yes 5. PAIN: "Is there any pain? If Yes, ask: "How bad is it?" (Scale 1-10; or mild, moderate, severe)    - MILD (1-3): doesn't interfere with normal activities     - MODERATE (4-7): interferes with normal activities or awakens from sleep    - SEVERE (8-10): excruciating pain, unable to do any normal activities       No 6. CONTACT LENS: "Do you wear contacts?"     No 7. OTHER SYMPTOMS: "Do you have any other symptoms?" (e.g., fever, runny nose, cough)     Sinus pressure 8. PREGNANCY: "Is there any chance you are pregnant?" "When was your last menstrual period?"     No  Protocols used: Eye - Pus or Discharge-A-AH

## 2023-06-17 NOTE — Assessment & Plan Note (Signed)
 Stage II anterior, Stage II posterior, Stage III apical prolapse - For treatment of pelvic organ prolapse, we discussed options for management including expectant management, conservative management, and surgical management, such as Kegels, a pessary, pelvic floor physical therapy, and specific surgical procedures. - She is interested in surgery. We discussed two options for prolapse repair:  1) vaginal repair without mesh - Pros - safer, no mesh complications - Cons - not as strong as mesh repair, higher risk of recurrence  2) laparoscopic repair with mesh - Pros - stronger, better long-term success - Cons - risks of mesh implant (erosion into vagina or bladder, adhering to the rectum, pain) - these risks are lower than with a vaginal mesh but still exist - She would prefer vaginal prolapse repair without mesh. Will plan for: TVH, BSO, USLS, anterior and posterior repair with perineorrhaphy, cysto

## 2023-06-17 NOTE — Assessment & Plan Note (Signed)
 Moderate lateral subconjunctival hemorrhage on exam.  This is most likely related to an acute bacterial conjunctivitis causing surface inflammation and predisposing to trauma.  Also had some recent coughing from sinus congestion that may have contributed.  No issues with vision.  Supportive care, I warned that these can take weeks to resolve.

## 2023-06-17 NOTE — Assessment & Plan Note (Signed)
-   Not that bothersome at this time, infrequent.  - We discussed reducing bladder irritants such as coffee or soda can reduce symptoms.

## 2023-06-17 NOTE — Assessment & Plan Note (Signed)
 Exam and history are consistent with acute bacterial conjunctivitis in the left eye.  This is also complicated by a subconjunctival hemorrhage probably related to surface inflammation.  No issues with vision, no pain, is having some mild swelling of the eyelid.  Will treat with moxifloxacin drops 3 times daily for 7 days.  Gave her return precautions if she has any increase in pain, changes in vision, or systemic symptoms.

## 2023-07-06 ENCOUNTER — Encounter: Admitting: Obstetrics and Gynecology

## 2023-07-17 ENCOUNTER — Other Ambulatory Visit: Payer: Self-pay | Admitting: Family Medicine

## 2023-07-21 ENCOUNTER — Encounter: Admitting: Obstetrics and Gynecology

## 2023-07-31 IMAGING — MG MM DIGITAL SCREENING BILAT W/ TOMO AND CAD
8 series · 8 of 24 positions shown · non-contrast
Comparison: Previous exam(s).

CLINICAL DATA: Screening.

EXAM:
DIGITAL SCREENING BILATERAL MAMMOGRAM WITH TOMOSYNTHESIS AND CAD
TECHNIQUE: Bilateral screening digital craniocaudal and mediolateral oblique
mammograms were obtained. Bilateral screening digital breast
tomosynthesis was performed. The images were evaluated with
computer-aided detection.

[L MLO synth-2D]
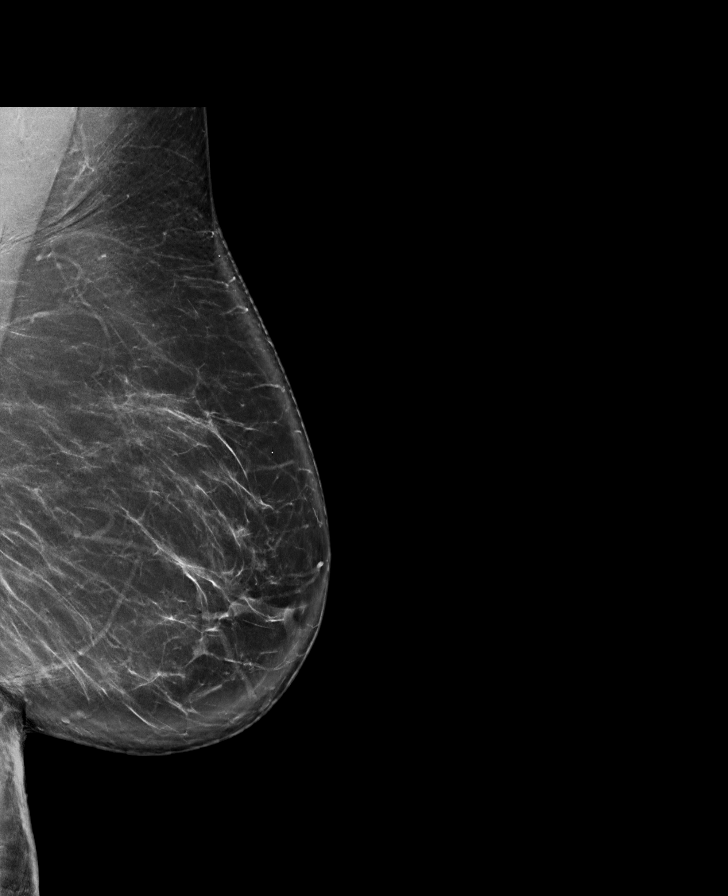

[R MLO synth-2D]
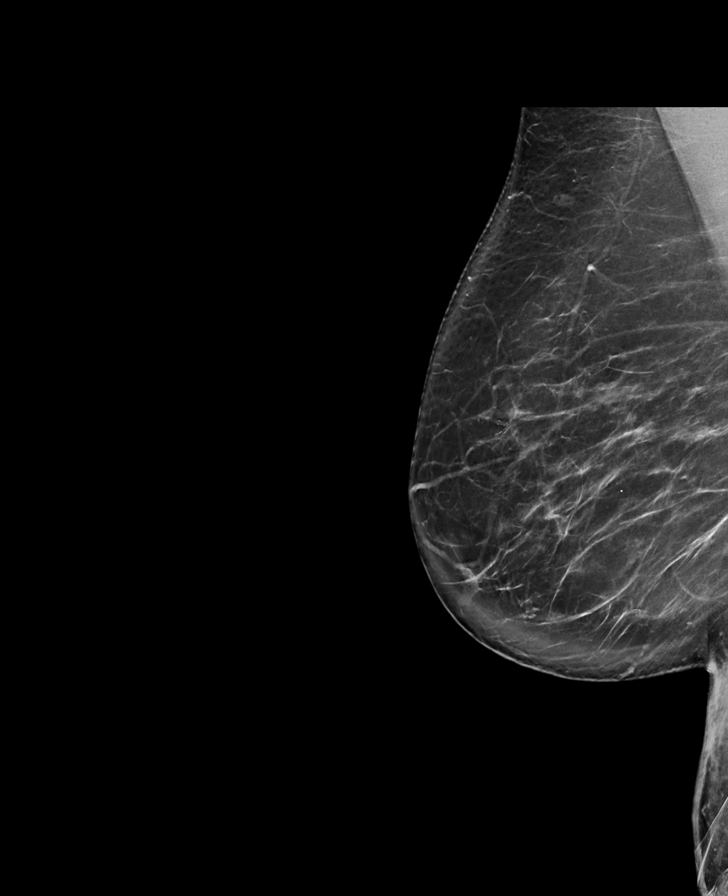

[L CC synth-2D]
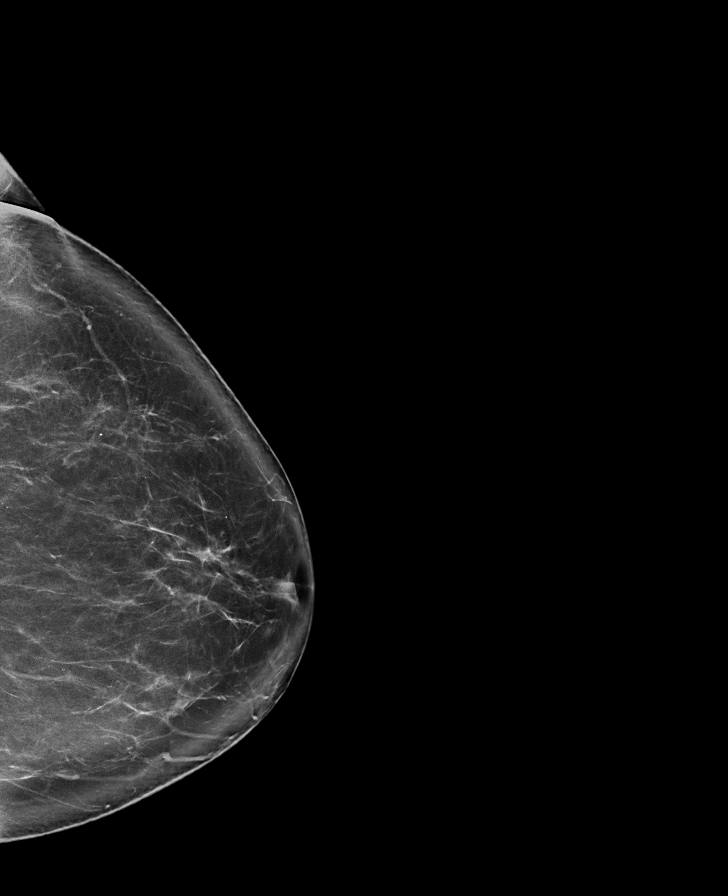

[R CC synth-2D]
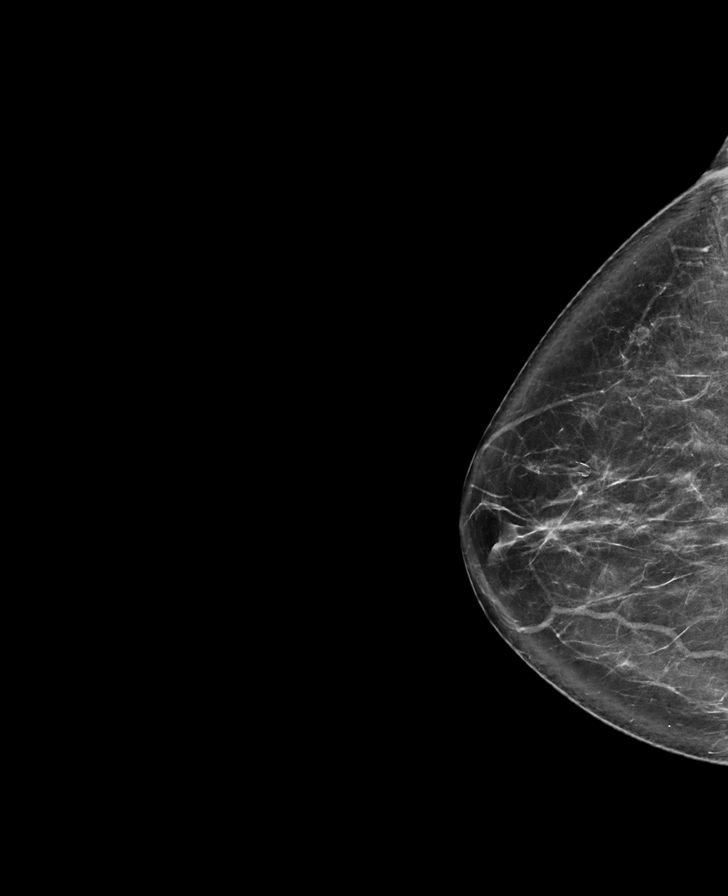

[L CC tomo · tomo slice 46/91.0]
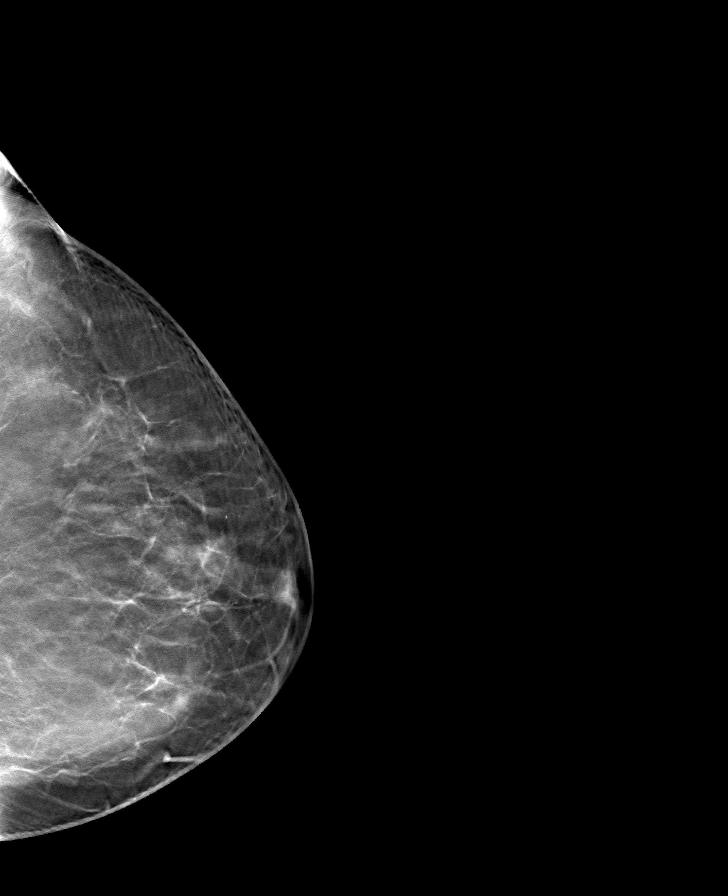

[L MLO tomo · tomo slice 49/97.0]
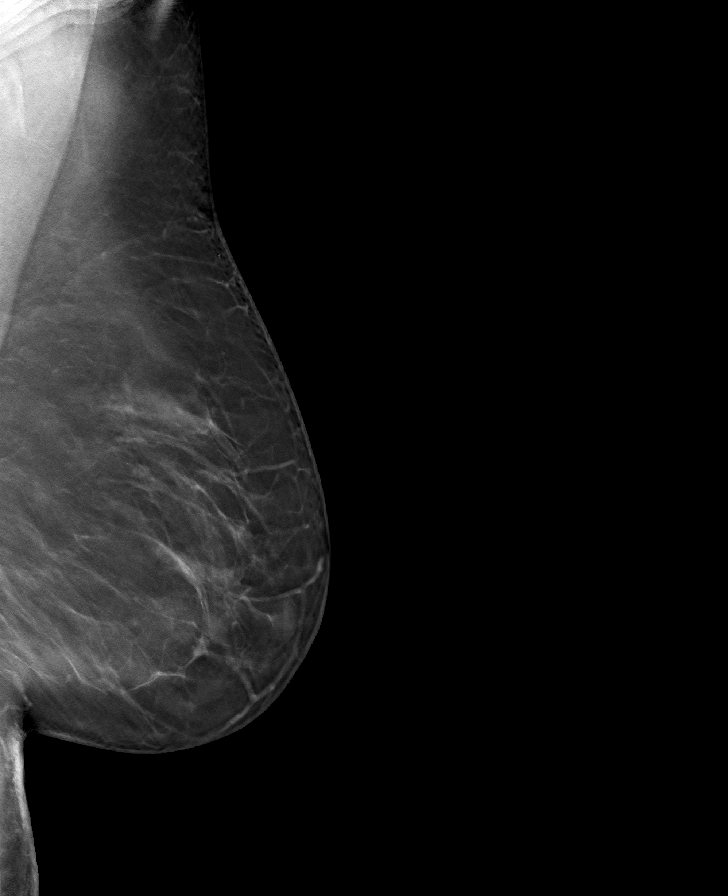

[R MLO tomo · tomo slice 47/92.0]
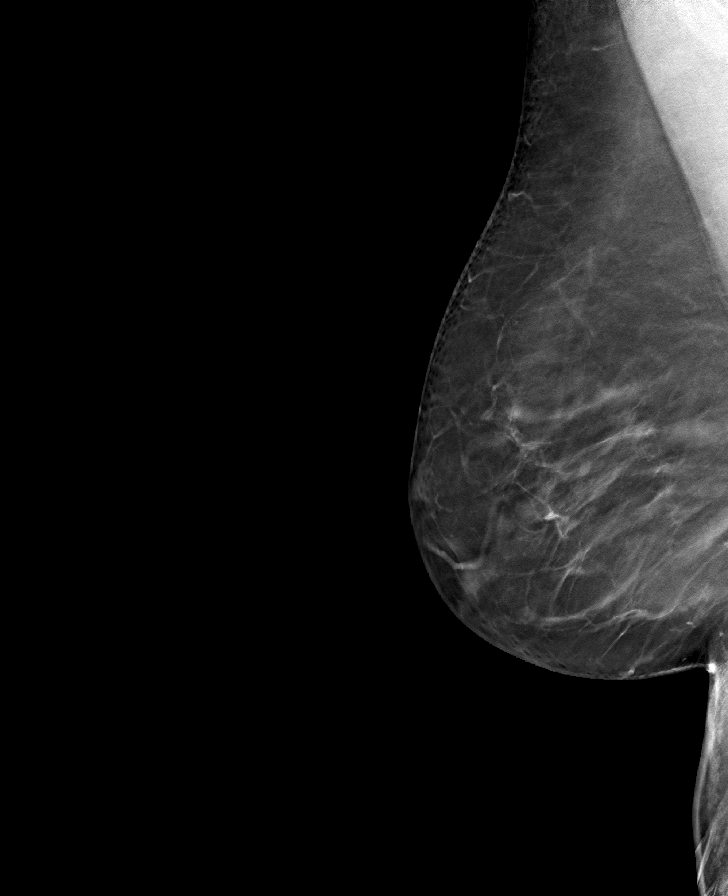

[R CC tomo · tomo slice 42/83.0]
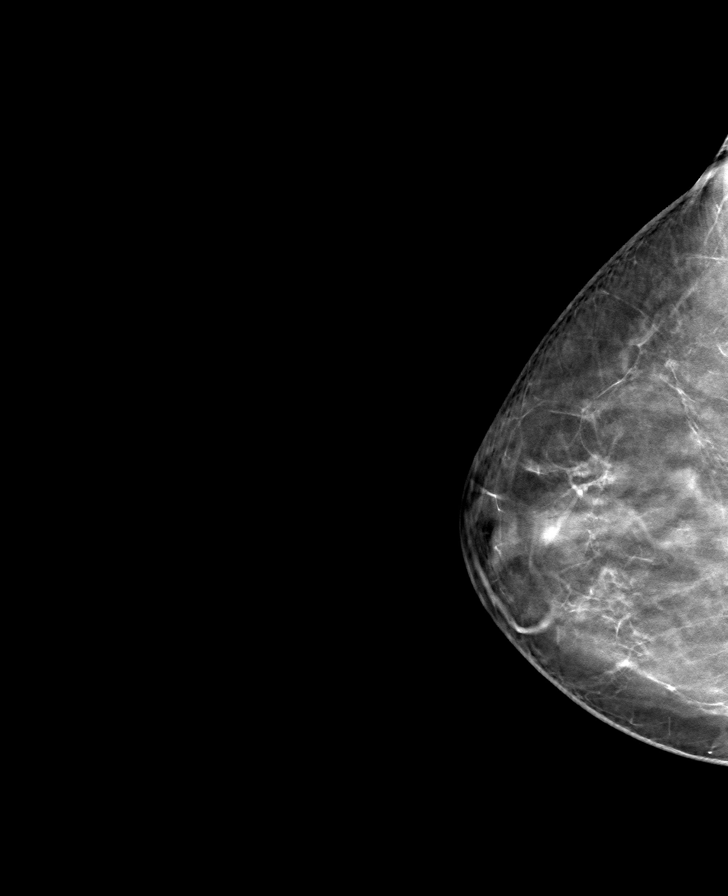

[8 of 24 positions shown; findings below may reference images not displayed]

ACR Breast Density Category b: There are scattered areas of
fibroglandular density.
FINDINGS: In the left breast, a possible asymmetry warrants further
evaluation. This possible asymmetry is seen within the outer LEFT
breast, at posterior depth, cc view only, slice 74.

In the right breast, no findings suspicious for malignancy.
IMPRESSION: Further evaluation is suggested for possible asymmetry in the left
breast.

RECOMMENDATION:
Diagnostic mammogram and possibly ultrasound of the left breast.
(Code:[id number]-[id number])

The patient will be contacted regarding the findings, and additional
imaging will be scheduled.

BI-RADS CATEGORY  0: Incomplete. Need additional imaging evaluation
and/or prior mammograms for comparison.

## 2023-08-06 ENCOUNTER — Ambulatory Visit: Admitting: Obstetrics and Gynecology

## 2023-08-16 ENCOUNTER — Other Ambulatory Visit: Payer: Self-pay | Admitting: Family Medicine

## 2023-08-17 ENCOUNTER — Telehealth: Payer: Self-pay | Admitting: Family Medicine

## 2023-08-17 NOTE — Telephone Encounter (Unsigned)
 Copied from CRM [id number]. Topic: Clinical - Medication Refill >> Aug 17, 2023 11:34 AM Dimple Francis wrote: Medication: metFORMIN  (GLUCOPHAGE ) 500 MG tablet  Has the patient contacted their pharmacy? Yes (Agent: If no, request that the patient contact the pharmacy for the refill. If patient does not wish to contact the pharmacy document the reason why and proceed with request.) (Agent: If yes, when and what did the pharmacy advise?)  This is the patient's preferred pharmacy:  [id number] - [place], [place] - [id number] [place] [address] Phone: [phone] Fax: [phone]  Is this the correct pharmacy for this prescription? Yes If no, delete pharmacy and type the correct one.   Has the prescription been filled recently? Yes  Is the patient out of the medication? Yes  Has the patient been seen for an appointment in the last year OR does the patient have an upcoming appointment? Yes  Can we respond through MyChart? Yes  Agent: Please be advised that Rx refills may take up to 3 business days. We ask that you follow-up with your pharmacy.

## 2023-08-18 NOTE — Telephone Encounter (Signed)
 Sent and confirmed by pharmacy on 4/28

## 2023-08-25 ENCOUNTER — Encounter: Payer: Self-pay | Admitting: Obstetrics and Gynecology

## 2023-08-25 ENCOUNTER — Ambulatory Visit (INDEPENDENT_AMBULATORY_CARE_PROVIDER_SITE_OTHER): Admitting: Obstetrics and Gynecology

## 2023-08-25 VITALS — BP 173/80 | HR 77

## 2023-08-25 DIAGNOSIS — N393 Stress incontinence (female) (male): Secondary | ICD-10-CM | POA: Diagnosis not present

## 2023-08-25 DIAGNOSIS — R948 Abnormal results of function studies of other organs and systems: Secondary | ICD-10-CM

## 2023-08-25 DIAGNOSIS — R339 Retention of urine, unspecified: Secondary | ICD-10-CM

## 2023-08-25 DIAGNOSIS — R350 Frequency of micturition: Secondary | ICD-10-CM

## 2023-08-25 LAB — POCT URINALYSIS DIPSTICK
Bilirubin, UA: NEGATIVE
Blood, UA: NEGATIVE
Glucose, UA: NEGATIVE
Ketones, UA: NEGATIVE
Leukocytes, UA: NEGATIVE
Nitrite, UA: NEGATIVE
Protein, UA: NEGATIVE
Spec Grav, UA: 1.015 (ref 1.010–1.025)
Urobilinogen, UA: 0.2 U/dL
pH, UA: 6.5 (ref 5.0–8.0)

## 2023-08-25 MED ORDER — PHENAZOPYRIDINE HCL 200 MG PO TABS: 200.0000 mg | ORAL_TABLET | Freq: Three times a day (TID) | ORAL | 0 refills | Status: AC | PRN

## 2023-08-25 NOTE — Progress Notes (Signed)
 [facility] Urodynamics Procedure  Referring Physician: Jess Morita, MD Date of Procedure: 08/25/2023  Heather Gallegos is a 78 y.o. female who presents for urodynamic evaluation. Indication(s) for study: mixed incontinence and incomplete bladder emptying  Vital Signs: BP (!) 173/80 (Cuff Size: Large)   Pulse 77   LMP 03/23/1998 (LMP Unknown)   Laboratory Results: A catheterized urine specimen revealed:  POC urine:  Lab Results  Component Value Date   COLORU Yellow 08/25/2023   CLARITYU Clear 08/25/2023   GLUCOSEUR Negative 08/25/2023   BILIRUBINUR Negative 08/25/2023   KETONESU Negative 08/25/2023   SPECGRAV 1.015 08/25/2023   RBCUR Negative 08/25/2023   PHUR 6.5 08/25/2023   PROTEINUR Negative 08/25/2023   UROBILINOGEN 0.2 08/25/2023   LEUKOCYTESUR Negative 08/25/2023     Voiding Diary: Deferred  Procedure Timeout:  The correct patient was verified and the correct procedure was verified. The patient was in the correct position and safety precautions were reviewed based on at the patient's history.  Urodynamic Procedure A 20F dual lumen urodynamics catheter was placed under sterile conditions into the patient's bladder. A 20F catheter was placed into the rectum in order to measure abdominal pressure. EMG patches were placed in the appropriate position.  All connections were confirmed and calibrations/adjusted made. Saline was instilled into the bladder through the dual lumen catheters.  Cough/valsalva pressures were measured periodically during filling.  Patient was allowed to void.  The bladder was then emptied of its residual.  UROFLOW: Revealed a Qmax of 3 mL/sec.  She voided 69 mL and had a residual of 175 mL.  It was a intermittent pattern and represented normal habits though interpretation limited due to low voided volume.  CMG: This was performed with sterile water in the sitting position at a fill rate of 30 mL/min.    First sensation of  fullness was 177 mLs,  First urge was 267 mLs,  Strong urge was 305 mLs and  Capacity was 502 mLs  Stress incontinence was demonstrated Highest positive Barrier CLPP was 134 cmH20 at 268 ml. Highest positive Barrier VLPP was 38 cmH20 at 268 ml.  Detrusor function was underactive, with no phasic contractions seen.    Compliance:  Low-Normal. End fill detrusor pressure was 15cmH20.  Calculated compliance was 61mL/cmH20  UPP: MUCP with barrier reduction was 23 cm of water.    MICTURITION STUDY: Voiding was performed with reduction using scopettes in the sitting position.  Pdet at Qmax was 0 cm of water.  Qmax was 12.5 mL/sec.  It was a intermittent pattern.  She voided 377 mL and had a residual of 125 mL.  It was a volitional void, sustained detrusor contraction was not present and abdominal straining was present  EMG: This was performed with patches.  She had voluntary contractions, recruitment with fill was not present and urethral sphincter was relaxed with void.  The details of the procedure with the study tracings have been scanned into EPIC.   Urodynamic Impression:  1. Sensation was reduced; capacity was normal 2. Stress Incontinence was demonstrated at normal pressures; 3. Detrusor Overactivity was not demonstrated. 4. Emptying was dysfunctional with a borderline PVR (175 with uroflow, and 125 with micturition study), a sustained detrusor contraction not present,  abdominal straining present, normal urethral sphincter activity on EMG.  Plan: - The patient will follow up  to discuss the findings and treatment options.

## 2023-09-07 ENCOUNTER — Ambulatory Visit (INDEPENDENT_AMBULATORY_CARE_PROVIDER_SITE_OTHER): Admitting: Obstetrics and Gynecology

## 2023-09-07 ENCOUNTER — Encounter: Payer: Self-pay | Admitting: Obstetrics and Gynecology

## 2023-09-07 VITALS — BP 156/82 | HR 76

## 2023-09-07 DIAGNOSIS — N393 Stress incontinence (female) (male): Secondary | ICD-10-CM | POA: Diagnosis not present

## 2023-09-07 DIAGNOSIS — N812 Incomplete uterovaginal prolapse: Secondary | ICD-10-CM

## 2023-09-07 DIAGNOSIS — R339 Retention of urine, unspecified: Secondary | ICD-10-CM | POA: Diagnosis not present

## 2023-09-07 NOTE — Progress Notes (Signed)
 [facility] Return Visit  SUBJECTIVE  History of Present Illness: Heather Gallegos is a 78 y.o. female seen in follow-up for prolapse. She underwent urodynamic testing.   Urodynamic Impression:  1. Sensation was reduced; capacity was normal 2. Stress Incontinence was demonstrated at normal pressures; 3. Detrusor Overactivity was not demonstrated. 4. Emptying was dysfunctional with a borderline PVR (175 with uroflow, and 125 with micturition study), a sustained detrusor contraction not present,  abdominal straining present, normal urethral sphincter activity on EMG.  Past Medical History: Patient  has a past medical history of Chronic sinusitis, Clostridium difficile infection (11/2015), Diabetes mellitus without complication (HCC), Elevated cholesterol, Environmental allergies, Fibroid, GERD (gastroesophageal reflux disease), Hypercholesteremia, Leg edema, left, Low back pain, and Uterine prolapse (2010).   Past Surgical History: She  has a past surgical history that includes Tonsillectomy and adenoidectomy and Breast cyst aspiration (Left, 2015).   Medications: She has a current medication list which includes the following prescription(s): b complex vitamins, biotin, calcium carbonate, cetirizine hcl, vitamin d3, coq10, lactobacillus-inulin, lutein, metformin , moxifloxacin , omeprazole , phenazopyridine , simvastatin , ascorbic acid, and vitamin e.   Allergies: Patient has no known allergies.   Social History: Patient  reports that she has never smoked. She has never used smokeless tobacco. She reports that she does not drink alcohol and does not use drugs.     OBJECTIVE     Physical Exam: Vitals:   09/07/23 0834  BP: (!) 156/82  Pulse: 76   Gen: No apparent distress, A&O x 3.  Detailed Urogynecologic Evaluation:  Deferred. Prior exam showed:  POP-Q   -1                                            Aa   -1                                           Ba   2                                               C    4                                            Gh   5                                            Pb   6                                            tvl    -1.5  Ap   -1.5                                            Bp   -3.5                                              D      ASSESSMENT AND PLAN    Ms. Thrush is a 78 y.o. with:  1. Uterovaginal prolapse, incomplete   2. Incomplete bladder emptying   3. SUI (stress urinary incontinence, female)    - She wants surgery but is still working so wants to figure out a good time with her work schedule.  - We discussed that due to borderline incomplete bladder emptying as well as abdominal straining, would recommend urethral bulking over sling at this time for incontinence. - Due to her ovarian cyst and age, will plan for BSO- we discussed that sometimes the adnexa are inaccessible vaginally and laparoscopy needs to be performed.   Plan for surgery: Exam under anesthesia, total vaginal hysterectomy, bilateral salpingo-oophorectomy, uterosacral ligament suspension, anterior and posterior repair with perineorrhaphy, cystoscopy, urethral bulking, possible laparoscopy  - We reviewed the patient's specific anatomic and functional findings, with the assistance of diagrams, and together finalized the above procedure. The planned surgical procedures were discussed along with the surgical risks outlined below, which were also provided on a detailed handout. Additional treatment options including expectant management, conservative management, medical management were discussed where appropriate.  We reviewed the benefits and risks of each treatment option.   General Surgical Risks: For all procedures, there are risks of bleeding, infection, damage to surrounding organs including but not limited to bowel, bladder, blood vessels, ureters and nerves, and need for further  surgery if an injury were to occur. These risks are all low with minimally invasive surgery.   There are risks of numbness and weakness at any body site or buttock/rectal pain.  It is possible that baseline pain can be worsened by surgery, either with or without mesh. If surgery is vaginal, there is also a low risk of possible conversion to laparoscopy or open abdominal incision where indicated. Very rare risks include blood transfusion, blood clot, heart attack, pneumonia, or death.   There is also a risk of short-term postoperative urinary retention with need to use a catheter. About half of patients need to go home from surgery with a catheter, which is then later removed in the office. The risk of long-term need for a catheter is very low. There is also a risk of worsening of overactive bladder.   Bulkamid Urethral bulking: We discussed success rate of approximately 70-80% and possible need for second injection. We reviewed that this is not a permanent procedure and the Bulkamid does become less effective over time. Risks reviewed including injury to bladder or urethra, UTI, urinary retention and hematuria.    Prolapse (with or without mesh): Risk factors for surgical failure  include things that put pressure on your pelvis and the surgical repair, including obesity, chronic cough, and heavy lifting or straining (including lifting children or adults, straining on the toilet, or lifting heavy objects such as furniture or anything weighing >25 lbs. Risks of recurrence is 20-30% with vaginal native tissue repair and  a less than 10% with sacrocolpopexy with mesh.     - For preop Visit:  She is required to have a visit within 30 days of her surgery.    - Medical clearance: not required Has A1c scheduled for July- needs A1c within ~3 months before surgery - Anticoagulant use: No - Medicaid Hysterectomy form: No - Accepts blood transfusion: Yes - Expected length of stay: outpatient  Request sent  for surgery scheduling.   Arma Lamp, MD

## 2023-10-05 ENCOUNTER — Ambulatory Visit

## 2023-10-11 ENCOUNTER — Encounter: Payer: Self-pay | Admitting: *Deleted

## 2023-10-11 ENCOUNTER — Encounter: Payer: Medicare PPO | Admitting: Family Medicine

## 2023-10-19 ENCOUNTER — Telehealth: Payer: Self-pay | Admitting: Pharmacist

## 2023-10-19 NOTE — Progress Notes (Signed)
 Pharmacy Quality Measure Review  This patient is appearing on a report for being at risk of failing the adherence measure for diabetes medications this calendar year.   Medication: metformin   Last fill date: 07/19/2023 for 30 day supply per adherence report. Spoke with [facility] and their last refill date was 07/27/2023 but was also only 30 day supply. Either way patient should be out of metformin .   Left voicemail for patient to return my call at their convenience. and MyChart message sent to patient.  Madelin Ray, PharmD Clinical Pharmacist [facility]  Population Health [phone]  10/21/2023 - Addendum Patient called back. She reports taking her last metformin  this morning and requests refill to be sent to her pharmacy - [facility] in [address].  Sent in prescription - next appt with PCP is 11/2023  Meds ordered this encounter  Medications   metFORMIN  (GLUCOPHAGE ) 500 MG tablet    Sig: Take 1 tablet (500 mg total) by mouth 2 (two) times daily with a meal.    Dispense:  180 tablet    Refill:  0   Madelin Ray, PharmD Clinical Pharmacist [facility]  Population Health [phone]

## 2023-10-21 MED ORDER — METFORMIN HCL 500 MG PO TABS: 500.0000 mg | ORAL_TABLET | Freq: Two times a day (BID) | ORAL | 0 refills | Status: DC

## 2023-10-21 NOTE — Addendum Note (Signed)
 Addended by: CARLA MILLING B on: 10/21/2023 12:29 PM   Modules accepted: Orders

## 2023-11-04 ENCOUNTER — Ambulatory Visit

## 2023-11-04 VITALS — Ht <= 58 in | Wt 148.0 lb

## 2023-11-04 DIAGNOSIS — Z0000 Encounter for general adult medical examination without abnormal findings: Secondary | ICD-10-CM | POA: Diagnosis not present

## 2023-11-04 NOTE — Progress Notes (Signed)
 Subjective:   Heather Gallegos is a 78 y.o. who presents for a Medicare Wellness preventive visit.  As a reminder, Annual Wellness Visits don't include a physical exam, and some assessments may be limited, especially if this visit is performed virtually. We may recommend an in-person follow-up visit with your provider if needed.  Visit Complete: Virtual I connected with  [name] on 11/04/23 by a audio enabled telemedicine application and verified that I am speaking with the correct person using two identifiers.  Patient Location: Home  Provider Location: Office/Clinic  I discussed the limitations of evaluation and management by telemedicine. The patient expressed understanding and agreed to proceed.  Vital Signs: Because this visit was a virtual/telehealth visit, some criteria may be missing or patient reported. Any vitals not documented were not able to be obtained and vitals that have been documented are patient reported.  VideoDeclined- This patient declined Librarian, academic. Therefore the visit was completed with audio only.  Persons Participating in Visit: Patient.  AWV Questionnaire: No: Patient Medicare AWV questionnaire was not completed prior to this visit.  Cardiac Risk Factors include: advanced age (>47men, >21 women);diabetes mellitus;dyslipidemia     Objective:    Today's Vitals   11/04/23 1536  Weight: 148 lb (67.1 kg)  Height: 4' 10 (1.473 m)   Body mass index is 30.93 kg/m.     11/04/2023    3:51 PM 08/19/2022    9:38 AM 11/13/2021    9:14 AM 09/16/2020    1:35 PM 08/25/2017    8:18 AM 06/11/2015   10:16 AM  Advanced Directives  Does Patient Have a Medical Advance Directive? Yes Yes Yes No No  No   Type of Estate agent of [place];Living will Healthcare Power of Attorney Living will     Copy of Healthcare Power of Attorney in Chart? No - copy requested No - copy requested      Would patient like  information on creating a medical advance directive?    No - Patient declined No - Patient declined  No - patient declined information      Data saved with a previous flowsheet row definition    Current Medications (verified) Outpatient Encounter Medications as of 11/04/2023  Medication Sig   b complex vitamins tablet Take 1 tablet by mouth daily.   Biotin 1000 MCG tablet Take 1,000 mcg by mouth daily.   Calcium Carbonate (CALCIUM 600 PO) Take by mouth daily.   Cetirizine HCl (ZYRTEC PO) Take by mouth as needed.   Cholecalciferol (VITAMIN D3) 1000 units CAPS    Coenzyme Q10 (COQ10) 100 MG CAPS    Lactobacillus-Inulin (PROBIOTIC DIGESTIVE SUPPORT PO) Take by mouth.   LUTEIN PO Take 20 mg by mouth daily.   metFORMIN (GLUCOPHAGE) 500 MG tablet Take 1 tablet (500 mg total) by mouth 2 (two) times daily with a meal.   moxifloxacin (VIGAMOX) 0.5 % ophthalmic solution Place 1 drop into the left eye 3 (three) times daily.   omeprazole (PRILOSEC) 20 MG capsule Take 20 mg by mouth daily.   phenazopyridine (PYRIDIUM) 200 MG tablet Take 1 tablet (200 mg total) by mouth 3 (three) times daily as needed for pain.   simvastatin (ZOCOR) 40 MG tablet TAKE 1 TABLET BY MOUTH DAILY   vitamin C (ASCORBIC ACID) 500 MG tablet Take 500 mg by mouth daily.   vitamin E 400 UNIT capsule Take 400 Units by mouth daily.   No facility-administered encounter medications on file as  of 11/04/2023.    Allergies (verified) Patient has no known allergies.   History: Past Medical History:  Diagnosis Date   Chronic sinusitis    Clostridium difficile infection 11/2015   Diabetes mellitus without complication (HCC)    Elevated cholesterol    Environmental allergies    Fibroid    GERD (gastroesophageal reflux disease)    Hypercholesteremia    Leg edema, left    Low back pain    Uterine prolapse 2010   Past Surgical History:  Procedure Laterality Date   BREAST CYST ASPIRATION Left 2015   TONSILLECTOMY AND  ADENOIDECTOMY     Family History  Problem Relation Age of Onset   Stomach cancer Mother 52       Stomach cancer   Hypertension Father    Stroke Father    Hypertension Brother    Diabetes Brother        Type II   Osteoporosis Maternal Aunt    Renal cancer Neg Hx    Uterine cancer Neg Hx    Bladder Cancer Neg Hx    Social History   Socioeconomic History   Marital status: Married    Spouse name: Phillp   Number of children: 2   Years of education: Not on file   Highest education level: Not on file  Occupational History   Not on file  Tobacco Use   Smoking status: Never   Smokeless tobacco: Never  Vaping Use   Vaping status: Never Used  Substance and Sexual Activity   Alcohol use: No    Alcohol/week: 0.0 standard drinks of alcohol   Drug use: No   Sexual activity: Yes    Partners: Male    Birth control/protection: Post-menopausal  Other Topics Concern   Not on file  Social History Narrative   Lives with spouse/2025   Social Drivers of Health   Financial Resource Strain: Low Risk  (11/04/2023)   Overall Financial Resource Strain (CARDIA)    Difficulty of Paying Living Expenses: Not hard at all  Food Insecurity: No Food Insecurity (11/04/2023)   Hunger Vital Sign    Worried About Running Out of Food in the Last Year: Never true    Ran Out of Food in the Last Year: Never true  Transportation Needs: No Transportation Needs (11/04/2023)   PRAPARE - Administrator, Civil Service (Medical): No    Lack of Transportation (Non-Medical): No  Physical Activity: Inactive (11/04/2023)   Exercise Vital Sign    Days of Exercise per Week: 0 days    Minutes of Exercise per Session: 0 min  Stress: No Stress Concern Present (11/04/2023)   [employer] of Occupational Health - Occupational Stress Questionnaire    Feeling of Stress: Not at all  Social Connections: Socially Integrated (11/04/2023)   Social Connection and Isolation Panel    Frequency of Communication  with Friends and Family: More than three times a week    Frequency of Social Gatherings with Friends and Family: Twice a week    Attends Religious Services: More than 4 times per year    Active Member of [employer] or Organizations: Yes    Attends Banker Meetings: Never    Marital Status: Married    Tobacco Counseling Counseling given: Not Answered    Clinical Intake:  Pre-visit preparation completed: Yes  Pain : No/denies pain     BMI - recorded: 30.93 Nutritional Status: BMI > 30  Obese Nutritional Risks: None Diabetes: Yes CBG done?:  No Did pt. bring in CBG monitor from home?: No  Lab Results  Component Value Date   HGBA1C 6.8 (H) 04/08/2023   HGBA1C 6.7 (H) 10/06/2022   HGBA1C 6.9 (H) 01/29/2022     How often do you need to have someone help you when you read instructions, pamphlets, or other written materials from your doctor or pharmacy?: 1 - Never  Interpreter Needed?: No  Information entered by :: Antania Hoefling, RMA   Activities of Daily Living     11/04/2023    3:41 PM  In your present state of health, do you have any difficulty performing the following activities:  Hearing? 0  Vision? 0  Difficulty concentrating or making decisions? 0  Walking or climbing stairs? 0  Dressing or bathing? 0  Doing errands, shopping? 0  Preparing Food and eating ? N  Using the Toilet? N  In the past six months, have you accidently leaked urine? N  Do you have problems with loss of bowel control? N  Managing your Medications? N  Managing your Finances? N  Housekeeping or managing your Housekeeping? N    Patient Care Team: Mahlon Comer BRAVO, MD as PCP - General (Family Medicine) Kristie Lamprey, MD as Consulting Physician (Gastroenterology) [facility] as Consulting Physician Glennon Almarie POUR, MD as Consulting Physician (Obstetrics and Gynecology)  I have updated your Care Teams any recent Medical Services you may have received  from other providers in the past year.     Assessment:   This is a routine wellness examination for Theda.  Hearing/Vision screen Hearing Screening - Comments:: Denies hearing difficulties   Vision Screening - Comments:: Denies vision issues./[place]    Goals Addressed   None    Depression Screen     11/04/2023    3:54 PM 04/08/2023    8:45 AM 10/06/2022    9:45 AM 08/19/2022    9:47 AM 06/09/2022    9:35 AM 03/11/2022    9:38 AM 02/09/2022   11:52 AM  PHQ 2/9 Scores  PHQ - 2 Score 0 0 0 0 0 0 0  PHQ- 9 Score 0 0 0 0 0 0 0    Fall Risk     11/04/2023    3:52 PM 04/08/2023    8:45 AM 03/09/2023    3:35 PM 10/06/2022    9:45 AM 08/19/2022    9:48 AM  Fall Risk   Falls in the past year? 0 0 0 0 0  Number falls in past yr: 0 0 0 0 0  Injury with Fall? 0 0 0 0 0  Risk for fall due to :  No Fall Risks No Fall Risks No Fall Risks   Follow up Falls evaluation completed;Falls prevention discussed  Falls evaluation completed Falls evaluation completed Falls evaluation completed;Education provided;Falls prevention discussed    MEDICARE RISK AT HOME:  Medicare Risk at Home Any stairs in or around the home?: Yes If so, are there any without handrails?: No Home free of loose throw rugs in walkways, pet beds, electrical cords, etc?: Yes Adequate lighting in your home to reduce risk of falls?: Yes Life alert?: No Use of a cane, walker or w/c?: No Grab bars in the bathroom?: Yes Shower chair or bench in shower?: Yes Elevated toilet seat or a handicapped toilet?: Yes  TIMED UP AND GO:  Was the test performed?  No  Cognitive Function: Declined/Normal: No cognitive concerns noted by patient or family. Patient alert, oriented, able to  answer questions appropriately and recall recent events. No signs of memory loss or confusion.    08/25/2017    8:19 AM  MMSE - Mini Mental State Exam  Orientation to time 5  Orientation to Place 5  Registration 3  Attention/ Calculation 5  Recall 3   Language- name 2 objects 2  Language- repeat 1  Language- follow 3 step command 3  Language- read & follow direction 1  Write a sentence 1  Copy design 1  Total score 30        08/19/2022    9:36 AM 11/13/2021    9:18 AM  6CIT Screen  What Year? 0 points 0 points  What month? 0 points 0 points  What time? 0 points 0 points  Count back from 20  0 points  Months in reverse  0 points  Repeat phrase  0 points  Total Score  0 points    Immunizations Immunization History  Administered Date(s) Administered   Fluad Quad(high Dose 65+) 12/28/2018, 03/20/2021   Influenza Split 01/23/2020   Influenza,inj,Quad PF,6+ Mos 02/26/2015, 12/05/2015, 02/16/2017, 11/25/2017   Influenza-Unspecified 01/18/2022   MODERNA COVID-19 SARS-COV-2 PEDS BIVALENT BOOSTER 1yr-13yr 08/29/2020   Moderna SARS-COV2 Booster Vaccination 08/29/2020   Moderna Sars-Covid-2 Vaccination 05/06/2019, 05/07/2019, 06/04/2019, 04/05/2020   Pneumococcal Conjugate-13 08/21/2016   Pneumococcal Polysaccharide-23 10/29/2010   Tdap 12/23/2009   Zoster Recombinant(Shingrix) 08/21/2017, 11/25/2017   Zoster, Live 12/23/2009    Screening Tests Health Maintenance  Topic Date Due   Diabetic kidney evaluation - Urine ACR  Never done   DTaP/Tdap/Td (2 - Td or Tdap) 12/24/2019   COVID-19 Vaccine (7 - 2024-25 season) 11/22/2022   Colonoscopy  03/01/2023   Medicare Annual Wellness (AWV)  08/19/2023   FOOT EXAM  10/06/2023   HEMOGLOBIN A1C  10/06/2023   INFLUENZA VACCINE  10/22/2023   OPHTHALMOLOGY EXAM  04/05/2024   Diabetic kidney evaluation - eGFR measurement  04/07/2024   MAMMOGRAM  06/15/2024   Pneumococcal Vaccine: 50+ Years  Completed   DEXA SCAN  Completed   Hepatitis C Screening  Completed   Zoster Vaccines- Shingrix  Completed   HPV VACCINES  Aged Out   Meningococcal B Vaccine  Aged Out    Health Maintenance  Health Maintenance Due  Topic Date Due   Diabetic kidney evaluation - Urine ACR  Never done    DTaP/Tdap/Td (2 - Td or Tdap) 12/24/2019   COVID-19 Vaccine (7 - 2024-25 season) 11/22/2022   Colonoscopy  03/01/2023   Medicare Annual Wellness (AWV)  08/19/2023   FOOT EXAM  10/06/2023   HEMOGLOBIN A1C  10/06/2023   INFLUENZA VACCINE  10/22/2023   Health Maintenance Items Addressed: See Nurse Notes at the end of this note  Additional Screening:  Vision Screening: Recommended annual ophthalmology exams for early detection of glaucoma and other disorders of the eye. Would you like a referral to an eye doctor? No    Dental Screening: Recommended annual dental exams for proper oral hygiene  Community Resource Referral / Chronic Care Management: CRR required this visit?  No   CCM required this visit?  No   Plan:    I have personally reviewed and noted the following in the patient's chart:   Medical and social history Use of alcohol, tobacco or illicit drugs  Current medications and supplements including opioid prescriptions. Patient is not currently taking opioid prescriptions. Functional ability and status Nutritional status Physical activity Advanced directives List of other physicians Hospitalizations, surgeries, and ER visits  in previous 12 months Vitals Screenings to include cognitive, depression, and falls Referrals and appointments  In addition, I have reviewed and discussed with patient certain preventive protocols, quality metrics, and best practice recommendations. A written personalized care plan for preventive services as well as general preventive health recommendations were provided to patient.   Imanni Burdine L Teja Costen, CMA   11/04/2023   After Visit Summary: (MyChart) Due to this being a telephonic visit, the after visit summary with patients personalized plan was offered to patient via MyChart   Notes: Patient is also due for a colonoscopy, which order has not been placed today.  Patient will call Dr. Nola office to schedule colonoscopy.  She stated that she  has had a eye exam during the earlier part of the year.  I have sent a request for record out today.  Patient had no other concerns to discuss today.

## 2023-11-04 NOTE — Patient Instructions (Signed)
 Heather Gallegos , Thank you for taking time out of your busy schedule to complete your Annual Wellness Visit with me. I enjoyed our conversation and look forward to speaking with you again next year. I, as well as your care team,  appreciate your ongoing commitment to your health goals. Please review the following plan we discussed and let me know if I can assist you in the future. Your Game plan/ To Do List    Referrals: If you haven't heard from the office you've been referred to, please reach out to them at the phone provided.   Follow up Visits: We will see or speak with you next year for your Next Medicare AWV with our clinical staff Have you seen your provider in the last 6 months (3 months if uncontrolled diabetes)? Yes.  Next office visit on 12/08/2023.  Clinician Recommendations:  Aim for 30 minutes of exercise or brisk walking, 6-8 glasses of water, and 5 servings of fruits and vegetables each day. You are due for a tetanus vaccine and can get that done at your pharmacy.  You are also due for a colonoscopy.  Please call Dr. Nola office to schedule.  You are due for a A1C check and a kidney evaluation and a foot exam, all you will get during your next office visit.        This is a list of the screenings recommended for you:  Health Maintenance  Topic Date Due   Yearly kidney health urinalysis for diabetes  Never done   DTaP/Tdap/Td vaccine (2 - Td or Tdap) 12/24/2019   COVID-19 Vaccine (7 - 2024-25 season) 11/22/2022   Colon Cancer Screening  03/01/2023   Medicare Annual Wellness Visit  08/19/2023   Complete foot exam   10/06/2023   Hemoglobin A1C  10/06/2023   Flu Shot  10/22/2023   Eye exam for diabetics  04/05/2024   Yearly kidney function blood test for diabetes  04/07/2024   Mammogram  06/15/2024   Pneumococcal Vaccine for age over 73  Completed   DEXA scan (bone density measurement)  Completed   Hepatitis C Screening  Completed   Zoster (Shingles) Vaccine  Completed   HPV  Vaccine  Aged Out   Meningitis B Vaccine  Aged Out    Advanced directives: (Copy Requested) Please bring a copy of your health care power of attorney and living will to the office to be added to your chart at your convenience. You can mail to [facility] [address] [place], [place] [address] or email to [email or web address] Advance Care Planning is important because it:  [x]  Makes sure you receive the medical care that is consistent with your values, goals, and preferences  [x]  It provides guidance to your family and loved ones and reduces their decisional burden about whether or not they are making the right decisions based on your wishes.  Follow the link provided in your after visit summary or read over the paperwork we have mailed to you to help you started getting your Advance Directives in place. If you need assistance in completing these, please reach out to us  so that we can help you!  See attachments for Preventive Care and Fall Prevention Tips.

## 2023-12-07 DIAGNOSIS — E66811 Obesity, class 1: Secondary | ICD-10-CM | POA: Insufficient documentation

## 2023-12-08 ENCOUNTER — Ambulatory Visit (INDEPENDENT_AMBULATORY_CARE_PROVIDER_SITE_OTHER): Admitting: Family Medicine

## 2023-12-08 ENCOUNTER — Encounter: Payer: Self-pay | Admitting: Family Medicine

## 2023-12-08 VITALS — BP 118/68 | HR 74 | Temp 98.2°F | Ht <= 58 in | Wt 146.0 lb

## 2023-12-08 DIAGNOSIS — M8580 Other specified disorders of bone density and structure, unspecified site: Secondary | ICD-10-CM

## 2023-12-08 DIAGNOSIS — E785 Hyperlipidemia, unspecified: Secondary | ICD-10-CM | POA: Diagnosis not present

## 2023-12-08 DIAGNOSIS — E119 Type 2 diabetes mellitus without complications: Secondary | ICD-10-CM

## 2023-12-08 DIAGNOSIS — Z0000 Encounter for general adult medical examination without abnormal findings: Secondary | ICD-10-CM | POA: Diagnosis not present

## 2023-12-08 LAB — LIPID PANEL
Cholesterol: 150 mg/dL (ref 0–200)
HDL: 45.1 mg/dL (ref >39.00)
LDL Cholesterol: 81 mg/dL (ref 0–99)
NonHDL: 104.75
Total CHOL/HDL Ratio: 3
Triglycerides: 120 mg/dL (ref 0.0–149.0)
VLDL: 24 mg/dL (ref 0.0–40.0)

## 2023-12-08 LAB — HEPATIC FUNCTION PANEL
ALT: 20 U/L (ref 0–35)
AST: 22 U/L (ref 0–37)
Albumin: 4.2 g/dL (ref 3.5–5.2)
Alkaline Phosphatase: 42 U/L (ref 39–117)
Bilirubin, Direct: 0.1 mg/dL (ref 0.0–0.3)
Total Bilirubin: 0.5 mg/dL (ref 0.2–1.2)
Total Protein: 6.8 g/dL (ref 6.0–8.3)

## 2023-12-08 LAB — CBC WITH DIFFERENTIAL/PLATELET
Basophils Absolute: 0 K/uL (ref 0.0–0.1)
Basophils Relative: 0.6 % (ref 0.0–3.0)
Eosinophils Absolute: 0.2 K/uL (ref 0.0–0.7)
Eosinophils Relative: 3.5 % (ref 0.0–5.0)
HCT: 39.7 % (ref 36.0–46.0)
Hemoglobin: 13.3 g/dL (ref 12.0–15.0)
Lymphocytes Relative: 29.5 % (ref 12.0–46.0)
Lymphs Abs: 1.4 K/uL (ref 0.7–4.0)
MCHC: 33.6 g/dL (ref 30.0–36.0)
MCV: 88.4 fl (ref 78.0–100.0)
Monocytes Absolute: 0.4 K/uL (ref 0.1–1.0)
Monocytes Relative: 8.6 % (ref 3.0–12.0)
Neutro Abs: 2.7 K/uL (ref 1.4–7.7)
Neutrophils Relative %: 57.8 % (ref 43.0–77.0)
Platelets: 282 K/uL (ref 150.0–400.0)
RBC: 4.49 Mil/uL (ref 3.87–5.11)
RDW: 13.4 % (ref 11.5–15.5)
WBC: 4.7 K/uL (ref 4.0–10.5)

## 2023-12-08 LAB — MICROALBUMIN / CREATININE URINE RATIO
Creatinine,U: 46.1 mg/dL
Microalb Creat Ratio: UNDETERMINED mg/g (ref 0.0–30.0)
Microalb, Ur: <0.7 mg/dL

## 2023-12-08 LAB — BASIC METABOLIC PANEL WITH GFR
BUN: 21 mg/dL (ref 6–23)
CO2: 27 meq/L (ref 19–32)
Calcium: 9.7 mg/dL (ref 8.4–10.5)
Chloride: 104 meq/L (ref 96–112)
Creatinine, Ser: 0.95 mg/dL (ref 0.40–1.20)
GFR: 57.44 mL/min — ABNORMAL LOW (ref >60.00)
Glucose, Bld: 116 mg/dL — ABNORMAL HIGH (ref 70–99)
Potassium: 4.2 meq/L (ref 3.5–5.1)
Sodium: 138 meq/L (ref 135–145)

## 2023-12-08 LAB — TSH: TSH: 3.39 u[IU]/mL (ref 0.35–5.50)

## 2023-12-08 LAB — VITAMIN D 25 HYDROXY (VIT D DEFICIENCY, FRACTURES): VITD: 68.87 ng/mL (ref 30.00–100.00)

## 2023-12-08 LAB — HEMOGLOBIN A1C: Hgb A1c MFr Bld: 7.1 % — ABNORMAL HIGH (ref 4.6–6.5)

## 2023-12-08 MED ORDER — SIMVASTATIN 40 MG PO TABS
40.0000 mg | ORAL_TABLET | Freq: Every day | ORAL | 1 refills | Status: AC
Start: 2023-12-08 — End: ?

## 2023-12-08 MED ORDER — METFORMIN HCL 500 MG PO TABS: 500.0000 mg | ORAL_TABLET | Freq: Two times a day (BID) | ORAL | 0 refills | Status: DC

## 2023-12-08 MED ORDER — TRIAMCINOLONE ACETONIDE 0.1 % EX OINT: 1.0000 | TOPICAL_OINTMENT | Freq: Two times a day (BID) | CUTANEOUS | 1 refills | Status: AC

## 2023-12-08 NOTE — Patient Instructions (Signed)
 Follow up in 6 months to recheck sugar, blood pressure, cholesterol We'll notify you of your lab results and make any changes if needed Keep up the good work on healthy diet and regular exercise- you look great! Call with any questions or concerns Stay Safe!  Stay Healthy! [place]!!!

## 2023-12-08 NOTE — Progress Notes (Signed)
 Subjective:    Patient ID: Heather Gallegos, female    DOB: June 04, 1945, 78 y.o.   MRN: [id number]  HPI CPE- due for microalbumin.  Had Tdap at [facility].  Has aged out of colonoscopy.  Due for A1C.  UTD on eye exam, mammogram.  Due for foot exam.  Patient Care Team    Relationship Specialty Notifications Start End  Mahlon Comer BRAVO, MD PCP - General Family Medicine  08/29/15   Kristie Lamprey, MD Consulting Physician Gastroenterology  08/25/17   [facility] Consulting Physician   09/04/20   Glennon Almarie POUR, MD Consulting Physician Obstetrics and Gynecology  04/08/23     Health Maintenance  Topic Date Due   Diabetic kidney evaluation - Urine ACR  Never done   DTaP/Tdap/Td (2 - Td or Tdap) 12/24/2019   Colonoscopy  03/01/2023   HEMOGLOBIN A1C  10/06/2023   Influenza Vaccine  10/22/2023   COVID-19 Vaccine (7 - 2025-26 season) 11/22/2023   OPHTHALMOLOGY EXAM  04/05/2024   Diabetic kidney evaluation - eGFR measurement  04/07/2024   Mammogram  06/15/2024   Medicare Annual Wellness (AWV)  11/03/2024   FOOT EXAM  12/07/2024   Pneumococcal Vaccine: 50+ Years  Completed   DEXA SCAN  Completed   Hepatitis C Screening  Completed   Zoster Vaccines- Shingrix  Completed   HPV VACCINES  Aged Out   Meningococcal B Vaccine  Aged Out     Review of Systems Patient reports no vision/ hearing changes, adenopathy,fever, weight change,  persistant/recurrent hoarseness , swallowing issues, chest pain, palpitations, edema, persistant/recurrent cough, hemoptysis, dyspnea (rest/exertional/paroxysmal nocturnal), gastrointestinal bleeding (melena, rectal bleeding), abdominal pain, significant heartburn, bowel changes, GU symptoms (dysuria, hematuria, incontinence), Gyn symptoms (abnormal  bleeding, pain),  syncope, focal weakness, memory loss, numbness & tingling, hair/nail changes, abnormal bruising or bleeding, anxiety, or depression.   + skin rash- eczema on wrists and forearms  bilaterally    Objective:   Physical Exam General Appearance:    Alert, cooperative, no distress, appears stated age  Head:    Normocephalic, without obvious abnormality, atraumatic  Eyes:    PERRL, conjunctiva/corneas clear, EOM's intact both eyes  Ears:    Normal TM's and external ear canals, both ears  Nose:   Nares normal, septum midline, mucosa normal, no drainage    or sinus tenderness  Throat:   Lips, mucosa, and tongue normal; teeth and gums normal  Neck:   Supple, symmetrical, trachea midline, no adenopathy;    Thyroid : no enlargement/tenderness/nodules  Back:     Symmetric, no curvature, ROM normal, no CVA tenderness  Lungs:     Clear to auscultation bilaterally, respirations unlabored  Chest Wall:    No tenderness or deformity   Heart:    Regular rate and rhythm, S1 and S2 normal, no murmur, rub   or gallop  Breast Exam:    Deferred to mammo  Abdomen:     Soft, non-tender, bowel sounds active all four quadrants,    no masses, no organomegaly  Genitalia:    Deferred  Rectal:    Extremities:   Extremities normal, atraumatic, no cyanosis or edema  Pulses:   2+ and symmetric all extremities  Skin:   Skin color, texture, turgor normal, no rashes or lesions  Lymph nodes:   Cervical, supraclavicular, and axillary nodes normal  Neurologic:   CNII-XII intact, normal strength, sensation and reflexes    throughout          Assessment & Plan:

## 2023-12-08 NOTE — Assessment & Plan Note (Signed)
 Pt's PE WNL w/ exception of BMI but she looks much younger than stated age.  UTD on mammo, DEXA, Tdap, PNA, flu.  Check labs.  Anticipatory guidance provided.

## 2023-12-09 ENCOUNTER — Ambulatory Visit: Payer: Self-pay | Admitting: Family Medicine

## 2023-12-09 ENCOUNTER — Other Ambulatory Visit: Payer: Self-pay | Admitting: Family Medicine

## 2023-12-09 DIAGNOSIS — E785 Hyperlipidemia, unspecified: Secondary | ICD-10-CM

## 2023-12-09 NOTE — Telephone Encounter (Signed)
 Copied from CRM [id number]. Topic: Clinical - Lab/Test Results >> Dec 09, 2023 10:37 AM Rea BROCKS wrote: Reason for CRM: Patient returned call. Relayed lab results.

## 2023-12-09 NOTE — Progress Notes (Signed)
 Left vm to return call to relay lab results

## 2024-01-26 ENCOUNTER — Ambulatory Visit: Payer: Self-pay

## 2024-01-26 NOTE — Telephone Encounter (Signed)
 FYI Only or Action Required?: FYI only for provider: appointment scheduled on 11/6.  Patient was last seen in primary care on 12/08/2023 by Mahlon Comer BRAVO, MD.  Called Nurse Triage reporting Dysuria.  Symptoms began 2 days ago.  Symptoms are: gradually worsening.  Triage Disposition: See Physician Within 24 Hours  Patient/caregiver understands and will follow disposition?: Yes      Copied from CRM #[id number]. Topic: Clinical - Red Word Triage >> Jan 26, 2024  1:34 PM Alfonso ORN wrote: Red Word that prompted transfer to Nurse Triage: pt experiencing pain with urination/UTI      Reason for Disposition  Age > 50 years  Answer Assessment - Initial Assessment Questions 1. SEVERITY: How bad is the pain?  (e.g., Scale 1-10; mild, moderate, or severe)     7/10 2. FREQUENCY: How many times have you had painful urination today?      4 times  3. PATTERN: Is pain present every time you urinate or just sometimes?      Every urination  4. ONSET: When did the painful urination start?      2 days ago  5. FEVER: Do you have a fever? If Yes, ask: What is your temperature, how was it measured, and when did it start?     No 6. PAST UTI: Have you had a urine infection before? If Yes, ask: When was the last time? and What happened that time?      Similar to past UTI 7. CAUSE: What do you think is causing the painful urination?  (e.g., UTI, scratch, Herpes sore)     Possible UTI 8. OTHER SYMPTOMS: Do you have any other symptoms? (e.g., blood in urine, flank pain, genital sores, urgency, vaginal discharge)     Increased urgency  Protocols used: Urination Pain - Female-A-AH

## 2024-01-26 NOTE — Telephone Encounter (Signed)
 FYI

## 2024-01-27 ENCOUNTER — Encounter: Payer: Self-pay | Admitting: Student in an Organized Health Care Education/Training Program

## 2024-01-27 ENCOUNTER — Ambulatory Visit (INDEPENDENT_AMBULATORY_CARE_PROVIDER_SITE_OTHER): Admitting: Student in an Organized Health Care Education/Training Program

## 2024-01-27 VITALS — BP 160/77 | HR 83 | Ht <= 58 in | Wt 146.0 lb

## 2024-01-27 DIAGNOSIS — R300 Dysuria: Secondary | ICD-10-CM | POA: Diagnosis not present

## 2024-01-27 DIAGNOSIS — R3129 Other microscopic hematuria: Secondary | ICD-10-CM | POA: Diagnosis not present

## 2024-01-27 DIAGNOSIS — L03119 Cellulitis of unspecified part of limb: Secondary | ICD-10-CM | POA: Insufficient documentation

## 2024-01-27 DIAGNOSIS — I872 Venous insufficiency (chronic) (peripheral): Secondary | ICD-10-CM | POA: Insufficient documentation

## 2024-01-27 LAB — POCT URINALYSIS DIPSTICK
Bilirubin, UA: NEGATIVE
Blood, UA: 200
Glucose, UA: NEGATIVE
Ketones, UA: NEGATIVE
Leukocytes, UA: NEGATIVE
Nitrite, UA: NEGATIVE
Protein, UA: NEGATIVE
Spec Grav, UA: >=1.03 — AB (ref 1.010–1.025)
Urobilinogen, UA: 0.2 U/dL
pH, UA: 6 (ref 5.0–8.0)

## 2024-01-27 NOTE — Progress Notes (Signed)
 Acute Office Visit  Patient ID: Heather Gallegos, female    DOB: [birth year], 78 y.o.   MRN: [id number]  PCP: Mahlon Comer BRAVO, MD  Chief Complaint  Patient presents with   Urinary Tract Infection    Sx started 3 days ago. Painful urination and urgency    Subjective:     HPI  Discussed the use of AI scribe software for clinical note transcription with the patient, who gave verbal consent to proceed.  History of Present Illness Heather Gallegos is a 78 year old female who presents with symptoms suggestive of a urinary tract infection.  She has been experiencing dysuria, urgency, and a sensation of incomplete bladder emptying for the past three days. She typically experiences one or two urinary tract infections per year, with the last occurrence in February or March. No hematuria, fever, or chills. She feels generally unwell but without high fevers. Her dietary intake and hydration are adequate, and she remains active around the house, noting a step count of 2,287 steps recently.  She has not noticed any darkening of her urine and has never been told she had hematuria before. No history of nephrolithiasis or smoking. She also denies any vaginal discharge, uterine bleeding, or vaginal bleeding.  She has a history of a prolapsed uterus, for which she had a workup in June with a urogynecologist. She has not had any surgical interventions or pessary placement for the prolapse and is currently monitoring the condition. She describes the prolapse as 'not horrible'.      Objective:    BP (!) 160/77 (BP Location: Right Arm, Patient Position: Sitting, Cuff Size: Normal)   Pulse 83   Ht 4' 10 (1.473 m)   Wt 146 lb (66.2 kg)   LMP 03/23/1998 (LMP Unknown)   SpO2 97%   BMI 30.51 kg/m   Physical Exam  Gen: Well-appearing woman Abd: Soft, nontender, no tenderness with deep palpation over the bladder Back: No costophrenic angle tenderness bilaterally Ext: Warm, no edema    Results for  orders placed or performed in visit on 01/27/24  POCT urinalysis dipstick  Result Value Ref Range   Color, UA yellow    Clarity, UA clear    Glucose, UA Negative Negative   Bilirubin, UA Negative    Ketones, UA Negative    Spec Grav, UA >=1.030 (A) 1.010 - 1.025   Blood, UA 200    pH, UA 6.0 5.0 - 8.0   Protein, UA Negative Negative   Urobilinogen, UA 0.2 0.2 or 1.0 E.U./dL   Nitrite, UA Negative    Leukocytes, UA Negative Negative   Appearance     Odor         Assessment & Plan:   Problem List Items Addressed This Visit       Unprioritized   Dysuria - Primary   3 days of mild dysuria type symptoms.  Point-of-care urinalysis today is reassuring with no leukocytes or nitrites.  Will send for urine culture.  Will hold off on antibiotics for now.  She has no symptoms to suggest pyelonephritis.  I do wonder if the dysuria might be coming from vaginal atrophy or perhaps from her known issue with uterovaginal prolapse.  I recommended supportive care in the meantime.  Urine was quite concentrated, I recommended improved hydration.      Relevant Orders   POCT urinalysis dipstick (Completed)   Urinalysis, Routine w reflex microscopic   Urine Culture   Microscopic hematuria   Point-of-care urinalysis incidentally  showed microscopic hematuria.  Will send for full urinalysis.  No history of nephrolithiasis.  No tobacco use history.  Seems very low risk for urothelial malignancy.  She has a known issue with uterovaginal prolapse which can cause microscopic hematuria.  No signs of infectious cystitis on the urinalysis, not treating with antibiotics at this time.  I recommended follow-up in 4 weeks for repeat urinalysis.  If she has persistent microscopic hematuria, would recommend CT renal study and cystoscopy with her urologist.       Return in about 4 weeks (around 02/24/2024) for hematuria follow up.  Cleatus Debby Specking, MD [facility]

## 2024-01-27 NOTE — Assessment & Plan Note (Signed)
 3 days of mild dysuria type symptoms.  Point-of-care urinalysis today is reassuring with no leukocytes or nitrites.  Will send for urine culture.  Will hold off on antibiotics for now.  She has no symptoms to suggest pyelonephritis.  I do wonder if the dysuria might be coming from vaginal atrophy or perhaps from her known issue with uterovaginal prolapse.  I recommended supportive care in the meantime.  Urine was quite concentrated, I recommended improved hydration.

## 2024-01-27 NOTE — Assessment & Plan Note (Signed)
 Point-of-care urinalysis incidentally showed microscopic hematuria.  Will send for full urinalysis.  No history of nephrolithiasis.  No tobacco use history.  Seems very low risk for urothelial malignancy.  She has a known issue with uterovaginal prolapse which can cause microscopic hematuria.  No signs of infectious cystitis on the urinalysis, not treating with antibiotics at this time.  I recommended follow-up in 4 weeks for repeat urinalysis.  If she has persistent microscopic hematuria, would recommend CT renal study and cystoscopy with her urologist.

## 2024-01-27 NOTE — Patient Instructions (Signed)
  VISIT SUMMARY: You came in today with symptoms that suggest a urinary tract infection, including pain during urination, urgency, and a feeling of incomplete bladder emptying. You have had similar infections in the past, but this time there is no fever or blood in your urine. You also have a history of a prolapsed uterus, which you are currently monitoring without surgical intervention.  YOUR PLAN: -HEMATURIA AND DYSURIA: Hematuria means there is blood in your urine, and dysuria means you have pain during urination. Your urinalysis showed blood in the urine but no infection. We have sent your urine for further testing to check for bacterial growth. In the meantime, stay hydrated and monitor your symptoms. We will hold off on antibiotics unless the culture shows an infection. If the blood in your urine continues, we may need to do a CT scan to look for other causes like kidney stones or bladder issues.  -PROLAPSED UTERUS: A prolapsed uterus occurs when the uterus slips down from its normal position. This condition might be causing some vaginal irritation, which could be contributing to your symptoms of blood in the urine and pain during urination. We will continue to monitor this condition and consider it as a possible factor in your symptoms.  INSTRUCTIONS: Please stay hydrated and keep an eye on your symptoms. We will contact you with the results of your urine culture. If the blood in your urine continues, we may need to schedule a CT scan. Follow up with your urogynecologist as needed for your prolapsed uterus.

## 2024-01-28 LAB — URINALYSIS, ROUTINE W REFLEX MICROSCOPIC
Bilirubin Urine: NEGATIVE
Ketones, ur: NEGATIVE
Leukocytes,Ua: NEGATIVE
Nitrite: NEGATIVE
Specific Gravity, Urine: 1.025 (ref 1.000–1.030)
Urine Glucose: NEGATIVE
Urobilinogen, UA: 0.2 (ref 0.0–1.0)
pH: 5.5 (ref 5.0–8.0)

## 2024-01-28 LAB — URINE CULTURE
MICRO NUMBER:: 17201471
SPECIMEN QUALITY:: ADEQUATE

## 2024-01-31 ENCOUNTER — Ambulatory Visit: Payer: Self-pay | Admitting: Student in an Organized Health Care Education/Training Program

## 2024-04-03 ENCOUNTER — Encounter: Payer: Self-pay | Admitting: *Deleted

## 2024-04-18 ENCOUNTER — Other Ambulatory Visit: Payer: Self-pay

## 2024-04-18 MED ORDER — METFORMIN HCL 500 MG PO TABS: 500.0000 mg | ORAL_TABLET | Freq: Two times a day (BID) | ORAL | 0 refills | Status: AC

## 2024-06-06 ENCOUNTER — Ambulatory Visit: Admitting: Family Medicine
